# Patient Record
Sex: Male | Born: 1955 | Race: White | Hispanic: No | Marital: Married | Smoking: Never smoker
Health system: Southern US, Community
[De-identification: ages and names within clinical notes are randomized; demographics above are authoritative.]

## PROBLEM LIST (undated history)

## (undated) DIAGNOSIS — F40240 Claustrophobia: Secondary | ICD-10-CM

## (undated) DIAGNOSIS — C801 Malignant (primary) neoplasm, unspecified: Secondary | ICD-10-CM

## (undated) DIAGNOSIS — F419 Anxiety disorder, unspecified: Secondary | ICD-10-CM

## (undated) DIAGNOSIS — M1990 Unspecified osteoarthritis, unspecified site: Secondary | ICD-10-CM

## (undated) DIAGNOSIS — N529 Male erectile dysfunction, unspecified: Secondary | ICD-10-CM

## (undated) DIAGNOSIS — E785 Hyperlipidemia, unspecified: Secondary | ICD-10-CM

## (undated) DIAGNOSIS — K219 Gastro-esophageal reflux disease without esophagitis: Secondary | ICD-10-CM

## (undated) DIAGNOSIS — R42 Dizziness and giddiness: Secondary | ICD-10-CM

## (undated) DIAGNOSIS — K5790 Diverticulosis of intestine, part unspecified, without perforation or abscess without bleeding: Secondary | ICD-10-CM

## (undated) HISTORY — PX: HERNIA REPAIR: SHX51

## (undated) HISTORY — DX: Dizziness and giddiness: R42

## (undated) HISTORY — PX: UPPER GASTROINTESTINAL ENDOSCOPY: SHX188

## (undated) HISTORY — PX: OTHER SURGICAL HISTORY: SHX169

## (undated) HISTORY — DX: Male erectile dysfunction, unspecified: N52.9

## (undated) HISTORY — DX: Hyperlipidemia, unspecified: E78.5

## (undated) HISTORY — DX: Diverticulosis of intestine, part unspecified, without perforation or abscess without bleeding: K57.90

## (undated) HISTORY — DX: Anxiety disorder, unspecified: F41.9

## (undated) HISTORY — DX: Claustrophobia: F40.240

## (undated) HISTORY — DX: Gastro-esophageal reflux disease without esophagitis: K21.9

---

## 2008-09-29 ENCOUNTER — Ambulatory Visit: Payer: Self-pay | Admitting: Surgery

## 2011-03-19 HISTORY — PX: COLONOSCOPY: SHX5424

## 2012-04-10 LAB — HM COLONOSCOPY: HM Colonoscopy: NORMAL

## 2013-06-22 LAB — PSA: PSA: 0.4

## 2014-06-27 LAB — LIPID PANEL
Cholesterol: 190 mg/dL (ref 0–200)
HDL: 54 mg/dL (ref 35–70)
LDL CALC: 114 mg/dL
Triglycerides: 108 mg/dL (ref 40–160)

## 2014-06-27 LAB — BASIC METABOLIC PANEL
CREATININE: 0.8 mg/dL (ref <=1.3)
Glucose: 87 mg/dL

## 2014-07-14 DIAGNOSIS — R7989 Other specified abnormal findings of blood chemistry: Secondary | ICD-10-CM | POA: Insufficient documentation

## 2014-07-14 DIAGNOSIS — Z113 Encounter for screening for infections with a predominantly sexual mode of transmission: Secondary | ICD-10-CM | POA: Insufficient documentation

## 2014-07-14 DIAGNOSIS — K219 Gastro-esophageal reflux disease without esophagitis: Secondary | ICD-10-CM | POA: Insufficient documentation

## 2014-07-14 DIAGNOSIS — Z1331 Encounter for screening for depression: Secondary | ICD-10-CM | POA: Insufficient documentation

## 2014-07-14 DIAGNOSIS — I10 Essential (primary) hypertension: Secondary | ICD-10-CM | POA: Insufficient documentation

## 2014-07-14 DIAGNOSIS — E7849 Other hyperlipidemia: Secondary | ICD-10-CM | POA: Insufficient documentation

## 2014-07-14 DIAGNOSIS — R945 Abnormal results of liver function studies: Secondary | ICD-10-CM | POA: Insufficient documentation

## 2014-11-05 ENCOUNTER — Other Ambulatory Visit: Payer: Self-pay | Admitting: Family Medicine

## 2014-11-05 DIAGNOSIS — K219 Gastro-esophageal reflux disease without esophagitis: Secondary | ICD-10-CM

## 2014-12-04 ENCOUNTER — Other Ambulatory Visit: Payer: Self-pay | Admitting: Family Medicine

## 2014-12-08 ENCOUNTER — Encounter: Payer: Self-pay | Admitting: Family Medicine

## 2014-12-08 ENCOUNTER — Ambulatory Visit (INDEPENDENT_AMBULATORY_CARE_PROVIDER_SITE_OTHER): Payer: BLUE CROSS/BLUE SHIELD | Admitting: Family Medicine

## 2014-12-08 ENCOUNTER — Ambulatory Visit: Payer: Self-pay | Admitting: Family Medicine

## 2014-12-08 VITALS — BP 130/88 | HR 64 | Temp 97.9°F | Ht 68.0 in | Wt 155.0 lb

## 2014-12-08 DIAGNOSIS — E784 Other hyperlipidemia: Secondary | ICD-10-CM | POA: Diagnosis not present

## 2014-12-08 DIAGNOSIS — E7849 Other hyperlipidemia: Secondary | ICD-10-CM

## 2014-12-08 DIAGNOSIS — I10 Essential (primary) hypertension: Secondary | ICD-10-CM

## 2014-12-08 DIAGNOSIS — J0100 Acute maxillary sinusitis, unspecified: Secondary | ICD-10-CM

## 2014-12-08 MED ORDER — AZITHROMYCIN 250 MG PO TABS: ORAL_TABLET | ORAL | Status: DC

## 2014-12-08 NOTE — Progress Notes (Signed)
Name: James Barry   MRN: [id number]    DOB: 1955-10-12   Date:12/08/2014       Progress Note  Subjective  Chief Complaint  Chief Complaint  Patient presents with  . Sinusitis    ear pain with it    Sinusitis This is a chronic problem. The current episode started in the past 7 days. The maximum temperature recorded prior to his arrival was 100.4 - 100.9 F. The fever has been present for 1 to 2 days. His pain is at a severity of 4/10. The pain is moderate. Associated symptoms include chills, congestion, diaphoresis, ear pain, headaches, sinus pressure, sneezing, a sore throat and swollen glands. Pertinent negatives include no coughing, hoarse voice, neck pain or shortness of breath. Past treatments include acetaminophen. The treatment provided no relief.    No problem-specific assessment & plan notes found for this encounter.   Past Medical History  Diagnosis Date  . GERD (gastroesophageal reflux disease)   . Erectile dysfunction     Past Surgical History  Procedure Laterality Date  . Colonoscopy  2013    cleared for 5 yrs- Dr @ [facility])    History reviewed. No pertinent family history.  Social History   Social History  . Marital Status: Single    Spouse Name: N/A  . Number of Children: N/A  . Years of Education: N/A   Occupational History  . Not on file.   Social History Main Topics  . Smoking status: Never Smoker   . Smokeless tobacco: Not on file  . Alcohol Use: 0.0 oz/week    0 Standard drinks or equivalent per week  . Drug Use: No  . Sexual Activity: Not on file   Other Topics Concern  . Not on file   Social History Narrative    Allergies  Allergen Reactions  . Buspirone   . Cephalexin      Review of Systems  Constitutional: Positive for chills and diaphoresis. Negative for fever, weight loss and malaise/fatigue.  HENT: Positive for congestion, ear pain, sinus pressure, sneezing and sore throat. Negative for ear discharge and  hoarse voice.   Eyes: Negative for blurred vision.  Respiratory: Negative for cough, sputum production, shortness of breath and wheezing.   Cardiovascular: Negative for chest pain, palpitations and leg swelling.  Gastrointestinal: Negative for heartburn, nausea, abdominal pain, diarrhea, constipation, blood in stool and melena.  Genitourinary: Negative for dysuria, urgency, frequency and hematuria.  Musculoskeletal: Negative for myalgias, back pain, joint pain and neck pain.  Skin: Negative for rash.  Neurological: Positive for headaches. Negative for dizziness, tingling, sensory change and focal weakness.  Endo/Heme/Allergies: Negative for environmental allergies and polydipsia. Does not bruise/bleed easily.  Psychiatric/Behavioral: Negative for depression and suicidal ideas. The patient is not nervous/anxious and does not have insomnia.      Objective  Filed Vitals:   12/08/14 0824  BP: 130/88  Pulse: 64  Temp: 97.9 F (36.6 C)  TempSrc: Oral  Height: 5\' 8"  (1.727 m)  Weight: 155 lb (70.308 kg)    Physical Exam  Constitutional: He is oriented to person, place, and time and well-developed, well-nourished, and in no distress.  HENT:  Head: Normocephalic.  Right Ear: External ear normal.  Left Ear: External ear normal.  Nose: Nose normal.  Mouth/Throat: Oropharynx is clear and moist.  Eyes: Conjunctivae and EOM are normal. Pupils are equal, round, and reactive to light. Right eye exhibits no discharge. Left eye exhibits no discharge. No scleral icterus.  Neck: Normal range of motion. Neck supple. No JVD present. No tracheal deviation present. No thyromegaly present.  Cardiovascular: Normal rate, regular rhythm, normal heart sounds and intact distal pulses.  Exam reveals no gallop and no friction rub.   No murmur heard. Pulmonary/Chest: Breath sounds normal. No respiratory distress. He has no wheezes. He has no rales.  Abdominal: Soft. Bowel sounds are normal. He exhibits no  mass. There is no hepatosplenomegaly. There is no tenderness. There is no rebound, no guarding and no CVA tenderness.  Musculoskeletal: Normal range of motion. He exhibits no edema or tenderness.  Lymphadenopathy:    He has no cervical adenopathy.  Neurological: He is alert and oriented to person, place, and time. He has normal sensation, normal strength, normal reflexes and intact cranial nerves. No cranial nerve deficit.  Skin: Skin is warm. No rash noted.  Psychiatric: Mood and affect normal.  Nursing note and vitals reviewed.     Assessment & Plan  Problem List Items Addressed This Visit      Cardiovascular and Mediastinum   Essential (primary) hypertension   Relevant Medications   sildenafil (VIAGRA) 100 MG tablet     Other   Familial multiple lipoprotein-type hyperlipidemia   Relevant Medications   sildenafil (VIAGRA) 100 MG tablet    Other Visit Diagnoses    Acute maxillary sinusitis, recurrence not specified    -  Primary    Relevant Medications    azithromycin (ZITHROMAX) 250 MG tablet         Dr. [facility]  12/08/2014

## 2015-02-27 ENCOUNTER — Ambulatory Visit (INDEPENDENT_AMBULATORY_CARE_PROVIDER_SITE_OTHER): Payer: BLUE CROSS/BLUE SHIELD | Admitting: Family Medicine

## 2015-02-27 ENCOUNTER — Encounter: Payer: Self-pay | Admitting: Family Medicine

## 2015-02-27 VITALS — BP 120/80 | HR 68 | Ht 68.0 in | Wt 158.0 lb

## 2015-02-27 DIAGNOSIS — J0100 Acute maxillary sinusitis, unspecified: Secondary | ICD-10-CM | POA: Diagnosis not present

## 2015-02-27 DIAGNOSIS — M2011 Hallux valgus (acquired), right foot: Secondary | ICD-10-CM | POA: Diagnosis not present

## 2015-02-27 MED ORDER — GUAIFENESIN-CODEINE 100-10 MG/5ML PO SOLN: 5.0000 mL | Freq: Three times a day (TID) | ORAL | Status: DC | PRN

## 2015-02-27 MED ORDER — AZITHROMYCIN 250 MG PO TABS: ORAL_TABLET | ORAL | Status: DC

## 2015-02-27 NOTE — Progress Notes (Signed)
Name: James Barry   MRN: [id number]    DOB: [birth year]   Date:02/27/2015       Progress Note  Subjective  Chief Complaint  Chief Complaint  Patient presents with  . Sinusitis    cough and cong, sore throat    Sinusitis This is a new problem. The current episode started in the past 7 days. The problem has been gradually worsening since onset. There has been no fever. Associated symptoms include congestion, coughing, headaches, sinus pressure, sneezing, a sore throat and swollen glands. Pertinent negatives include no chills, diaphoresis, ear pain, hoarse voice, neck pain or shortness of breath. Past treatments include nothing. The treatment provided no relief.    No problem-specific assessment & plan notes found for this encounter.   Past Medical History  Diagnosis Date  . GERD (gastroesophageal reflux disease)   . Erectile dysfunction     Past Surgical History  Procedure Laterality Date  . Colonoscopy  2013    cleared for 5 yrs- Dr @ [facility])    History reviewed. No pertinent family history.  Social History   Social History  . Marital Status: Single    Spouse Name: N/A  . Number of Children: N/A  . Years of Education: N/A   Occupational History  . Not on file.   Social History Main Topics  . Smoking status: Never Smoker   . Smokeless tobacco: Not on file  . Alcohol Use: 0.0 oz/week    0 Standard drinks or equivalent per week  . Drug Use: No  . Sexual Activity: Yes   Other Topics Concern  . Not on file   Social History Narrative    Allergies  Allergen Reactions  . Buspirone   . Cephalexin      Review of Systems  Constitutional: Negative for fever, chills, weight loss, malaise/fatigue and diaphoresis.  HENT: Positive for congestion, sinus pressure, sneezing and sore throat. Negative for ear discharge, ear pain and hoarse voice.   Eyes: Negative for blurred vision.  Respiratory: Positive for cough. Negative for sputum production,  shortness of breath and wheezing.   Cardiovascular: Negative for chest pain, palpitations and leg swelling.  Gastrointestinal: Negative for heartburn, nausea, abdominal pain, diarrhea, constipation, blood in stool and melena.  Genitourinary: Negative for dysuria, urgency, frequency and hematuria.  Musculoskeletal: Negative for myalgias, back pain, joint pain and neck pain.  Skin: Negative for rash.  Neurological: Positive for headaches. Negative for dizziness, tingling, sensory change and focal weakness.  Endo/Heme/Allergies: Negative for environmental allergies and polydipsia. Does not bruise/bleed easily.  Psychiatric/Behavioral: Negative for depression and suicidal ideas. The patient is not nervous/anxious and does not have insomnia.      Objective  Filed Vitals:   02/27/15 1633  BP: 120/80  Pulse: 68  Height: 5\' 8"  (1.727 m)  Weight: 158 lb (71.668 kg)    Physical Exam  Constitutional: He is oriented to person, place, and time and well-developed, well-nourished, and in no distress.  HENT:  Head: Normocephalic.  Right Ear: External ear normal.  Left Ear: External ear normal.  Nose: Nose normal.  Mouth/Throat: Oropharynx is clear and moist.  Eyes: Conjunctivae and EOM are normal. Pupils are equal, round, and reactive to light. Right eye exhibits no discharge. Left eye exhibits no discharge. No scleral icterus.  Neck: Normal range of motion. Neck supple. No JVD present. No tracheal deviation present. No thyromegaly present.  Cardiovascular: Normal rate, regular rhythm, normal heart sounds and intact distal pulses.  Exam  reveals no gallop and no friction rub.   No murmur heard. Pulmonary/Chest: Breath sounds normal. No respiratory distress. He has no wheezes. He has no rales.  Abdominal: Soft. Bowel sounds are normal. He exhibits no mass. There is no hepatosplenomegaly. There is no tenderness. There is no rebound, no guarding and no CVA tenderness.  Musculoskeletal: Normal range  of motion. He exhibits no edema or tenderness.  Lymphadenopathy:    He has no cervical adenopathy.  Neurological: He is alert and oriented to person, place, and time. He has normal sensation, normal strength, normal reflexes and intact cranial nerves. No cranial nerve deficit.  Skin: Skin is warm. No rash noted.  Psychiatric: Mood and affect normal.      Assessment & Plan  Problem List Items Addressed This Visit    None    Visit Diagnoses    Acute maxillary sinusitis, recurrence not specified    -  Primary    Relevant Medications    azithromycin (ZITHROMAX) 250 MG tablet    guaiFENesin-codeine 100-10 MG/5ML syrup    Bunion of great toe, right        Relevant Orders    Ambulatory referral to Podiatry         Dr. Deanna [facility]  02/27/2015

## 2015-03-17 ENCOUNTER — Ambulatory Visit (INDEPENDENT_AMBULATORY_CARE_PROVIDER_SITE_OTHER): Payer: BLUE CROSS/BLUE SHIELD | Admitting: Family Medicine

## 2015-03-17 ENCOUNTER — Encounter: Payer: Self-pay | Admitting: Family Medicine

## 2015-03-17 VITALS — BP 120/82 | HR 80 | Temp 98.0°F | Ht 68.0 in | Wt 158.0 lb

## 2015-03-17 DIAGNOSIS — J0100 Acute maxillary sinusitis, unspecified: Secondary | ICD-10-CM | POA: Diagnosis not present

## 2015-03-17 MED ORDER — DOXYCYCLINE HYCLATE 100 MG PO TABS: 100.0000 mg | ORAL_TABLET | Freq: Two times a day (BID) | ORAL | Status: DC

## 2015-03-17 NOTE — Progress Notes (Signed)
Name: James Barry   MRN: [id number]    DOB: July 13, 1955   Date:03/17/2015       Progress Note  Subjective  Chief Complaint  Chief Complaint  Patient presents with  . Sinusitis    cough and cong.- had a ZPack a couple of weeks ago    Sinusitis This is a new problem. The current episode started yesterday. The problem has been waxing and waning since onset. The maximum temperature recorded prior to his arrival was 100.4 - 100.9 F. Associated symptoms include chills, congestion, coughing, diaphoresis, a hoarse voice, sinus pressure, sneezing, a sore throat and swollen glands. Pertinent negatives include no ear pain, headaches, neck pain or shortness of breath. Past treatments include acetaminophen. The treatment provided mild relief.    No problem-specific assessment & plan notes found for this encounter.   Past Medical History  Diagnosis Date  . GERD (gastroesophageal reflux disease)   . Erectile dysfunction     Past Surgical History  Procedure Laterality Date  . Colonoscopy  2013    cleared for 5 yrs- Dr @ [facility])    History reviewed. No pertinent family history.  Social History   Social History  . Marital Status: Single    Spouse Name: N/A  . Number of Children: N/A  . Years of Education: N/A   Occupational History  . Not on file.   Social History Main Topics  . Smoking status: Never Smoker   . Smokeless tobacco: Not on file  . Alcohol Use: 0.0 oz/week    0 Standard drinks or equivalent per week  . Drug Use: No  . Sexual Activity: Yes   Other Topics Concern  . Not on file   Social History Narrative    Allergies  Allergen Reactions  . Buspirone   . Cephalexin      Review of Systems  Constitutional: Positive for chills and diaphoresis. Negative for fever, weight loss and malaise/fatigue.  HENT: Positive for congestion, hoarse voice, sinus pressure, sneezing and sore throat. Negative for ear discharge and ear pain.   Eyes: Negative  for blurred vision.  Respiratory: Positive for cough. Negative for sputum production, shortness of breath and wheezing.   Cardiovascular: Negative for chest pain, palpitations and leg swelling.  Gastrointestinal: Negative for heartburn, nausea, abdominal pain, diarrhea, constipation, blood in stool and melena.  Genitourinary: Negative for dysuria, urgency, frequency and hematuria.  Musculoskeletal: Negative for myalgias, back pain, joint pain and neck pain.  Skin: Negative for rash.  Neurological: Negative for dizziness, tingling, sensory change, focal weakness and headaches.  Endo/Heme/Allergies: Negative for environmental allergies and polydipsia. Does not bruise/bleed easily.  Psychiatric/Behavioral: Negative for depression and suicidal ideas. The patient is not nervous/anxious and does not have insomnia.      Objective  Filed Vitals:   03/17/15 1003  BP: 120/82  Pulse: 80  Temp: 98 F (36.7 C)  TempSrc: Oral  Height: 5\' 8"  (1.727 m)  Weight: 158 lb (71.668 kg)    Physical Exam  Constitutional: He is oriented to person, place, and time and well-developed, well-nourished, and in no distress.  HENT:  Head: Normocephalic.  Right Ear: External ear normal.  Left Ear: External ear normal.  Nose: Nose normal.  Mouth/Throat: Oropharynx is clear and moist.  Eyes: Conjunctivae and EOM are normal. Pupils are equal, round, and reactive to light. Right eye exhibits no discharge. Left eye exhibits no discharge. No scleral icterus.  Neck: Normal range of motion. Neck supple. No JVD present.  No tracheal deviation present. No thyromegaly present.  Cardiovascular: Normal rate, regular rhythm, normal heart sounds and intact distal pulses.  Exam reveals no gallop and no friction rub.   No murmur heard. Pulmonary/Chest: Breath sounds normal. No respiratory distress. He has no wheezes. He has no rales.  Abdominal: Soft. Bowel sounds are normal. He exhibits no mass. There is no hepatosplenomegaly.  There is no tenderness. There is no rebound, no guarding and no CVA tenderness.  Musculoskeletal: Normal range of motion. He exhibits no edema or tenderness.  Lymphadenopathy:    He has no cervical adenopathy.  Neurological: He is alert and oriented to person, place, and time. He has normal sensation, normal strength, normal reflexes and intact cranial nerves. No cranial nerve deficit.  Skin: Skin is warm. No rash noted.  Psychiatric: Mood and affect normal.  Nursing note and vitals reviewed.     Assessment & Plan  Problem List Items Addressed This Visit    None    Visit Diagnoses    Acute maxillary sinusitis, recurrence not specified    -  Primary    Relevant Medications    doxycycline (VIBRA-TABS) 100 MG tablet         Dr. [facility]  03/17/2015

## 2015-04-10 ENCOUNTER — Other Ambulatory Visit: Payer: Self-pay

## 2015-04-10 DIAGNOSIS — J019 Acute sinusitis, unspecified: Secondary | ICD-10-CM

## 2015-04-10 MED ORDER — DOXYCYCLINE HYCLATE 100 MG PO TABS: 100.0000 mg | ORAL_TABLET | Freq: Two times a day (BID) | ORAL | Status: DC

## 2015-04-28 ENCOUNTER — Encounter: Payer: Self-pay | Admitting: Family Medicine

## 2015-04-28 ENCOUNTER — Ambulatory Visit (INDEPENDENT_AMBULATORY_CARE_PROVIDER_SITE_OTHER): Payer: BLUE CROSS/BLUE SHIELD | Admitting: Family Medicine

## 2015-04-28 VITALS — BP 122/94 | HR 68 | Ht 68.0 in | Wt 158.0 lb

## 2015-04-28 DIAGNOSIS — S29011A Strain of muscle and tendon of front wall of thorax, initial encounter: Secondary | ICD-10-CM

## 2015-04-28 DIAGNOSIS — S46212A Strain of muscle, fascia and tendon of other parts of biceps, left arm, initial encounter: Secondary | ICD-10-CM

## 2015-04-28 DIAGNOSIS — R079 Chest pain, unspecified: Secondary | ICD-10-CM | POA: Diagnosis not present

## 2015-04-28 NOTE — Progress Notes (Signed)
Name: CHOL GRAFFIUS   MRN: [id number]    DOB: 1955/11/27   Date:04/28/2015       Progress Note  Subjective  Chief Complaint  Chief Complaint  Patient presents with  . Chest Pain    Chest Pain  This is a new problem. The current episode started yesterday. The onset quality is sudden. The problem occurs constantly. The problem has been waxing and waning. The pain is present in the lateral region. The pain is at a severity of 7/10 (presently 5/10). The quality of the pain is described as tightness. The pain radiates to the left shoulder and left arm. Pertinent negatives include no abdominal pain, back pain, cough, diaphoresis, dizziness, exertional chest pressure, fever, headaches, irregular heartbeat, malaise/fatigue, nausea, near-syncope, palpitations, shortness of breath, sputum production or weakness. The pain is aggravated by movement. He has tried nothing for the symptoms. The treatment provided mild relief.    No problem-specific assessment & plan notes found for this encounter.   Past Medical History  Diagnosis Date  . GERD (gastroesophageal reflux disease)   . Erectile dysfunction     Past Surgical History  Procedure Laterality Date  . Colonoscopy  2013    cleared for 5 yrs- Dr @ [facility])    History reviewed. No pertinent family history.  Social History   Social History  . Marital Status: Single    Spouse Name: N/A  . Number of Children: N/A  . Years of Education: N/A   Occupational History  . Not on file.   Social History Main Topics  . Smoking status: Never Smoker   . Smokeless tobacco: Not on file  . Alcohol Use: 0.0 oz/week    0 Standard drinks or equivalent per week  . Drug Use: No  . Sexual Activity: Yes   Other Topics Concern  . Not on file   Social History Narrative    Allergies  Allergen Reactions  . Buspirone   . Cephalexin      Review of Systems  Constitutional: Negative for fever, chills, weight loss, malaise/fatigue  and diaphoresis.  HENT: Negative for ear discharge, ear pain and sore throat.   Eyes: Negative for blurred vision.  Respiratory: Negative for cough, sputum production, shortness of breath and wheezing.   Cardiovascular: Positive for chest pain. Negative for palpitations, leg swelling and near-syncope.  Gastrointestinal: Negative for heartburn, nausea, abdominal pain, diarrhea, constipation, blood in stool and melena.  Genitourinary: Negative for dysuria, urgency, frequency and hematuria.  Musculoskeletal: Negative for myalgias, back pain, joint pain and neck pain.  Skin: Negative for rash.  Neurological: Negative for dizziness, tingling, sensory change, focal weakness, weakness and headaches.  Endo/Heme/Allergies: Negative for environmental allergies and polydipsia. Does not bruise/bleed easily.  Psychiatric/Behavioral: Negative for depression and suicidal ideas. The patient is not nervous/anxious and does not have insomnia.      Objective  Filed Vitals:   04/28/15 1017  BP: 122/94  Pulse: 68  Height: 5\' 8"  (1.727 m)  Weight: 158 lb (71.668 kg)    Physical Exam  Constitutional: He is oriented to person, place, and time and well-developed, well-nourished, and in no distress.  HENT:  Head: Normocephalic.  Right Ear: External ear normal.  Left Ear: External ear normal.  Nose: Nose normal.  Mouth/Throat: Oropharynx is clear and moist.  Eyes: Conjunctivae and EOM are normal. Pupils are equal, round, and reactive to light. Right eye exhibits no discharge. Left eye exhibits no discharge. No scleral icterus.  Neck: Normal range  of motion. Neck supple. No JVD present. No tracheal deviation present. No thyromegaly present.  Cardiovascular: Normal rate, regular rhythm, normal heart sounds and intact distal pulses.  Exam reveals no gallop and no friction rub.   No murmur heard. Pulmonary/Chest: Breath sounds normal. No accessory muscle usage. No tachypnea. No respiratory distress. He has no  wheezes. He has no rales. He exhibits tenderness. He exhibits no deformity.    Abdominal: Soft. Bowel sounds are normal. He exhibits no mass. There is no hepatosplenomegaly. There is no tenderness. There is no rebound, no guarding and no CVA tenderness.  Musculoskeletal: Normal range of motion. He exhibits no edema.       Left elbow: Tenderness found.       Arms: Tenderness bicep  Lymphadenopathy:    He has no cervical adenopathy.  Neurological: He is alert and oriented to person, place, and time. He has normal sensation, normal strength and intact cranial nerves. No cranial nerve deficit.  Skin: Skin is warm. No rash noted.  Psychiatric: Mood and affect normal.      Assessment & Plan  Problem List Items Addressed This Visit    None    Visit Diagnoses    Chest pain, unspecified chest pain type    -  Primary    Relevant Orders    EKG 12-Lead (Completed)    Intercostal muscle strain, initial encounter        aleve    Biceps strain, left, initial encounter             Dr. [facility]  04/28/2015

## 2015-06-09 ENCOUNTER — Other Ambulatory Visit: Payer: Self-pay | Admitting: Internal Medicine

## 2015-06-28 ENCOUNTER — Ambulatory Visit (INDEPENDENT_AMBULATORY_CARE_PROVIDER_SITE_OTHER): Payer: BLUE CROSS/BLUE SHIELD | Admitting: Family Medicine

## 2015-06-28 ENCOUNTER — Encounter: Payer: Self-pay | Admitting: Family Medicine

## 2015-06-28 VITALS — BP 130/88 | HR 80 | Ht 68.0 in | Wt 158.0 lb

## 2015-06-28 DIAGNOSIS — N401 Enlarged prostate with lower urinary tract symptoms: Secondary | ICD-10-CM | POA: Diagnosis not present

## 2015-06-28 DIAGNOSIS — E7849 Other hyperlipidemia: Secondary | ICD-10-CM

## 2015-06-28 DIAGNOSIS — E784 Other hyperlipidemia: Secondary | ICD-10-CM

## 2015-06-28 DIAGNOSIS — Z1211 Encounter for screening for malignant neoplasm of colon: Secondary | ICD-10-CM | POA: Diagnosis not present

## 2015-06-28 DIAGNOSIS — R351 Nocturia: Secondary | ICD-10-CM

## 2015-06-28 DIAGNOSIS — R945 Abnormal results of liver function studies: Secondary | ICD-10-CM

## 2015-06-28 DIAGNOSIS — N529 Male erectile dysfunction, unspecified: Secondary | ICD-10-CM | POA: Insufficient documentation

## 2015-06-28 DIAGNOSIS — Z0000 Encounter for general adult medical examination without abnormal findings: Secondary | ICD-10-CM

## 2015-06-28 DIAGNOSIS — K219 Gastro-esophageal reflux disease without esophagitis: Secondary | ICD-10-CM

## 2015-06-28 DIAGNOSIS — R7989 Other specified abnormal findings of blood chemistry: Secondary | ICD-10-CM

## 2015-06-28 LAB — POCT URINALYSIS DIPSTICK
Bilirubin, UA: NEGATIVE
Blood, UA: NEGATIVE
Glucose, UA: NEGATIVE
KETONES UA: NEGATIVE
LEUKOCYTES UA: NEGATIVE
Nitrite, UA: NEGATIVE
PH UA: 6
PROTEIN UA: NEGATIVE
Spec Grav, UA: <=1.005
Urobilinogen, UA: 0.2

## 2015-06-28 LAB — HEMOCCULT GUIAC POC 1CARD (OFFICE): Fecal Occult Blood, POC: POSITIVE — AB

## 2015-06-28 MED ORDER — SILDENAFIL CITRATE 100 MG PO TABS: 100.0000 mg | ORAL_TABLET | Freq: Every day | ORAL | Status: DC | PRN

## 2015-06-28 MED ORDER — ESOMEPRAZOLE MAGNESIUM 40 MG PO CPDR
40.0000 mg | DELAYED_RELEASE_CAPSULE | Freq: Every day | ORAL | Status: DC
Start: 2015-06-28 — End: 2015-12-08

## 2015-06-28 NOTE — Progress Notes (Signed)
Name: James Barry   MRN: [id number]    DOB: August 18, 1955   Date:06/28/2015       Progress Note  Subjective  Chief Complaint  Chief Complaint  Patient presents with  . Annual Exam    HPI Comments: Patient presents for annual physical exam.   No problem-specific assessment & plan notes found for this encounter.   Past Medical History  Diagnosis Date  . GERD (gastroesophageal reflux disease)   . Erectile dysfunction     Past Surgical History  Procedure Laterality Date  . Colonoscopy  2013    cleared for 5 yrs- Dr @ [facility])    History reviewed. No pertinent family history.  Social History   Social History  . Marital Status: Single    Spouse Name: N/A  . Number of Children: N/A  . Years of Education: N/A   Occupational History  . Not on file.   Social History Main Topics  . Smoking status: Never Smoker   . Smokeless tobacco: Not on file  . Alcohol Use: 0.0 oz/week    0 Standard drinks or equivalent per week  . Drug Use: No  . Sexual Activity: Yes   Other Topics Concern  . Not on file   Social History Narrative    Allergies  Allergen Reactions  . Buspirone   . Cephalexin      Review of Systems  Constitutional: Negative for fever, chills, weight loss and malaise/fatigue.  HENT: Negative for ear discharge, ear pain and sore throat.   Eyes: Negative for blurred vision.  Respiratory: Negative for cough, sputum production, shortness of breath and wheezing.   Cardiovascular: Negative for chest pain, palpitations and leg swelling.  Gastrointestinal: Negative for heartburn, nausea, abdominal pain, diarrhea, constipation, blood in stool and melena.  Genitourinary: Negative for dysuria, urgency, frequency and hematuria.  Musculoskeletal: Negative for myalgias, back pain, joint pain and neck pain.  Skin: Negative for rash.  Neurological: Negative for dizziness, tingling, sensory change, focal weakness and headaches.  Endo/Heme/Allergies:  Negative for environmental allergies and polydipsia. Does not bruise/bleed easily.  Psychiatric/Behavioral: Negative for depression and suicidal ideas. The patient is not nervous/anxious and does not have insomnia.      Objective  Filed Vitals:   06/28/15 0833  BP: 130/88  Pulse: 80  Height: 5\' 8"  (1.727 m)  Weight: 158 lb (71.668 kg)    Physical Exam  Constitutional: He is oriented to person, place, and time and well-developed, well-nourished, and in no distress.  HENT:  Head: Normocephalic.  Right Ear: External ear normal.  Left Ear: External ear normal.  Nose: Nose normal.  Mouth/Throat: Oropharynx is clear and moist.  Eyes: Conjunctivae and EOM are normal. Pupils are equal, round, and reactive to light. Right eye exhibits no discharge. Left eye exhibits no discharge. No scleral icterus.  Neck: Normal range of motion. Neck supple. No JVD present. No tracheal deviation present. No thyromegaly present.  Cardiovascular: Normal rate, regular rhythm, normal heart sounds and intact distal pulses.  Exam reveals no gallop and no friction rub.   No murmur heard. Pulmonary/Chest: Breath sounds normal. No respiratory distress. He has no wheezes. He has no rales.  Abdominal: Soft. Bowel sounds are normal. He exhibits no mass. There is no hepatosplenomegaly. There is no tenderness. There is no rebound, no guarding and no CVA tenderness.  Genitourinary: Rectum normal, prostate normal, testes/scrotum normal and penis normal. He exhibits no abnormal testicular mass, no testicular tenderness, no abnormal scrotal mass and no  scrotal tenderness.  Musculoskeletal: Normal range of motion. He exhibits no edema or tenderness.  Lymphadenopathy:    He has no cervical adenopathy.  Neurological: He is alert and oriented to person, place, and time. He has normal sensation, normal strength, normal reflexes and intact cranial nerves. No cranial nerve deficit.  Skin: Skin is warm. No rash noted.  Psychiatric:  Mood and affect normal.  Nursing note and vitals reviewed.     Assessment & Plan  Problem List Items Addressed This Visit      Digestive   Acid reflux   Relevant Medications   esomeprazole (NEXIUM) 40 MG capsule     Genitourinary   Erectile dysfunction     Other   Familial multiple lipoprotein-type hyperlipidemia   Relevant Medications   sildenafil (VIAGRA) 100 MG tablet   Other Relevant Orders   Lipid Profile   Abnormal LFTs   Relevant Orders   Hepatic function panel    Other Visit Diagnoses    Annual physical exam    -  Primary    Relevant Orders    Renal Function Panel    POCT urinalysis dipstick (Completed)    Hemoglobin    Benign prostatic hypertrophy with nocturia        Relevant Orders    PSA    Colon cancer screening        Relevant Orders    POCT Occult Blood Stool (Completed)         Dr. Otilio Miu [facility]  06/28/2015

## 2015-06-29 LAB — RENAL FUNCTION PANEL
ALBUMIN: 4.6 g/dL (ref 3.5–5.5)
BUN/Creatinine Ratio: 16 (ref 9–20)
BUN: 12 mg/dL (ref 6–24)
CALCIUM: 9.8 mg/dL (ref 8.7–10.2)
CHLORIDE: 98 mmol/L (ref 96–106)
CO2: 29 mmol/L (ref 18–29)
Creatinine, Ser: 0.73 mg/dL — ABNORMAL LOW (ref 0.76–1.27)
GFR calc Af Amer: 117 mL/min/{1.73_m2} (ref >59)
GFR, EST NON AFRICAN AMERICAN: 102 mL/min/{1.73_m2} (ref >59)
GLUCOSE: 84 mg/dL (ref 65–99)
PHOSPHORUS: 4.2 mg/dL (ref 2.5–4.5)
Potassium: 4.3 mmol/L (ref 3.5–5.2)
Sodium: 139 mmol/L (ref 134–144)

## 2015-06-29 LAB — LIPID PANEL
CHOLESTEROL TOTAL: 186 mg/dL (ref 100–199)
Chol/HDL Ratio: 3.6 ratio units (ref 0.0–5.0)
HDL: 51 mg/dL (ref >39)
LDL Calculated: 119 mg/dL — ABNORMAL HIGH (ref 0–99)
Triglycerides: 78 mg/dL (ref 0–149)
VLDL CHOLESTEROL CAL: 16 mg/dL (ref 5–40)

## 2015-06-29 LAB — HEPATIC FUNCTION PANEL
ALK PHOS: 61 IU/L (ref 39–117)
ALT: 18 IU/L (ref 0–44)
AST: 20 IU/L (ref 0–40)
BILIRUBIN TOTAL: 0.4 mg/dL (ref 0.0–1.2)
BILIRUBIN, DIRECT: 0.12 mg/dL (ref 0.00–0.40)
TOTAL PROTEIN: 7.2 g/dL (ref 6.0–8.5)

## 2015-06-29 LAB — HEMOGLOBIN: Hemoglobin: 13.3 g/dL (ref 12.6–17.7)

## 2015-06-29 LAB — PSA: Prostate Specific Ag, Serum: 0.5 ng/mL (ref 0.0–4.0)

## 2015-08-02 ENCOUNTER — Encounter: Payer: Self-pay | Admitting: Family Medicine

## 2015-08-02 ENCOUNTER — Ambulatory Visit (INDEPENDENT_AMBULATORY_CARE_PROVIDER_SITE_OTHER): Payer: BLUE CROSS/BLUE SHIELD | Admitting: Family Medicine

## 2015-08-02 VITALS — BP 130/100 | HR 80 | Ht 68.0 in | Wt 158.0 lb

## 2015-08-02 DIAGNOSIS — L739 Follicular disorder, unspecified: Secondary | ICD-10-CM

## 2015-08-02 MED ORDER — SULFAMETHOXAZOLE-TRIMETHOPRIM 800-160 MG PO TABS: 1.0000 | ORAL_TABLET | Freq: Two times a day (BID) | ORAL | Status: DC

## 2015-08-02 MED ORDER — MUPIROCIN 2 % EX OINT: 1.0000 "application " | TOPICAL_OINTMENT | Freq: Two times a day (BID) | CUTANEOUS | Status: DC

## 2015-08-02 NOTE — Progress Notes (Signed)
Name: James Barry   MRN: [id number]    DOB: 08-20-1955   Date:08/02/2015       Progress Note  Subjective  Chief Complaint  Chief Complaint  Patient presents with  . Cyst    "ingrown hair- pants are rubbing it and making it sore"    Rash This is a new problem. The current episode started in the past 7 days. The problem has been waxing and waning since onset. The affected locations include the groin. The rash is characterized by draining, redness and swelling. He was exposed to nothing. Pertinent negatives include no anorexia, congestion, cough, diarrhea, eye pain, facial edema, fatigue, fever, joint pain, nail changes, rhinorrhea, shortness of breath, sore throat or vomiting. Treatments tried: warm compresses. The treatment provided no relief.    No problem-specific assessment & plan notes found for this encounter.   Past Medical History  Diagnosis Date  . GERD (gastroesophageal reflux disease)   . Erectile dysfunction     Past Surgical History  Procedure Laterality Date  . Colonoscopy  2013    cleared for 5 yrs- Dr @ [facility])    History reviewed. No pertinent family history.  Social History   Social History  . Marital Status: Single    Spouse Name: N/A  . Number of Children: N/A  . Years of Education: N/A   Occupational History  . Not on file.   Social History Main Topics  . Smoking status: Never Smoker   . Smokeless tobacco: Not on file  . Alcohol Use: 0.0 oz/week    0 Standard drinks or equivalent per week  . Drug Use: No  . Sexual Activity: Yes   Other Topics Concern  . Not on file   Social History Narrative    Allergies  Allergen Reactions  . Buspirone   . Cephalexin      Review of Systems  Constitutional: Negative for fever, chills, weight loss, malaise/fatigue and fatigue.  HENT: Negative for congestion, ear discharge, ear pain, rhinorrhea and sore throat.   Eyes: Negative for blurred vision and pain.  Respiratory: Negative  for cough, sputum production, shortness of breath and wheezing.   Cardiovascular: Negative for chest pain, palpitations and leg swelling.  Gastrointestinal: Negative for heartburn, nausea, vomiting, abdominal pain, diarrhea, constipation, blood in stool, melena and anorexia.  Genitourinary: Negative for dysuria, urgency, frequency and hematuria.  Musculoskeletal: Negative for myalgias, back pain, joint pain and neck pain.  Skin: Positive for rash. Negative for nail changes.  Neurological: Negative for dizziness, tingling, sensory change, focal weakness and headaches.  Endo/Heme/Allergies: Negative for environmental allergies and polydipsia. Does not bruise/bleed easily.  Psychiatric/Behavioral: Negative for depression and suicidal ideas. The patient is not nervous/anxious and does not have insomnia.      Objective  Filed Vitals:   08/02/15 0802  BP: 130/100  Pulse: 80  Height: 5\' 8"  (1.727 m)  Weight: 158 lb (71.668 kg)    Physical Exam  Constitutional: He is oriented to person, place, and time and well-developed, well-nourished, and in no distress.  HENT:  Head: Normocephalic.  Right Ear: External ear normal.  Left Ear: External ear normal.  Nose: Nose normal.  Mouth/Throat: Oropharynx is clear and moist.  Eyes: Conjunctivae and EOM are normal. Pupils are equal, round, and reactive to light. Right eye exhibits no discharge. Left eye exhibits no discharge. No scleral icterus.  Neck: Normal range of motion. Neck supple. No JVD present. No tracheal deviation present. No thyromegaly present.  Cardiovascular:  Normal rate, regular rhythm, normal heart sounds and intact distal pulses.  Exam reveals no gallop and no friction rub.   No murmur heard. Pulmonary/Chest: Breath sounds normal. No respiratory distress. He has no wheezes. He has no rales.  Abdominal: Soft. Bowel sounds are normal. He exhibits no mass. There is no hepatosplenomegaly. There is no tenderness. There is no rebound, no  guarding and no CVA tenderness.  Musculoskeletal: Normal range of motion. He exhibits no edema or tenderness.  Lymphadenopathy:    He has no cervical adenopathy.  Neurological: He is alert and oriented to person, place, and time. He has normal sensation, normal strength, normal reflexes and intact cranial nerves. No cranial nerve deficit.  Skin: Skin is warm. No rash noted. There is erythema.  Psychiatric: Mood and affect normal.  Nursing note and vitals reviewed.     Assessment & Plan  Problem List Items Addressed This Visit    None    Visit Diagnoses    Acute folliculitis    -  Primary    area cleaned with betadine/ unroofed with #15/ purulence noted/ dressing call if worsens    Relevant Medications    sulfamethoxazole-trimethoprim (BACTRIM DS,SEPTRA DS) 800-160 MG tablet    mupirocin ointment (BACTROBAN) 2 %         Dr. Aydden Cumpian [facility]  08/02/2015

## 2015-12-07 ENCOUNTER — Ambulatory Visit (INDEPENDENT_AMBULATORY_CARE_PROVIDER_SITE_OTHER): Payer: BLUE CROSS/BLUE SHIELD | Admitting: Family Medicine

## 2015-12-07 ENCOUNTER — Encounter: Payer: Self-pay | Admitting: Family Medicine

## 2015-12-07 VITALS — BP 148/98 | HR 80 | Temp 97.6°F | Ht 67.0 in | Wt 147.0 lb

## 2015-12-07 DIAGNOSIS — J40 Bronchitis, not specified as acute or chronic: Secondary | ICD-10-CM | POA: Diagnosis not present

## 2015-12-07 DIAGNOSIS — J0100 Acute maxillary sinusitis, unspecified: Secondary | ICD-10-CM | POA: Diagnosis not present

## 2015-12-07 MED ORDER — GUAIFENESIN-CODEINE 100-10 MG/5ML PO SYRP: 5.0000 mL | ORAL_SOLUTION | Freq: Three times a day (TID) | ORAL | 0 refills | Status: DC | PRN

## 2015-12-07 MED ORDER — AZITHROMYCIN 250 MG PO TABS: ORAL_TABLET | ORAL | 0 refills | Status: DC

## 2015-12-07 NOTE — Progress Notes (Signed)
Name: James Barry   MRN: [id number]    DOB: [birth year]   Date:12/07/2015       Progress Note  Subjective  Chief Complaint  Chief Complaint  Patient presents with  . Cough    since yesterday/ throat sore at night-"i have this hacking cough"  . Nasal Congestion    Cough  This is a new problem. The current episode started yesterday. The problem has been gradually worsening. The problem occurs every few minutes. The cough is productive of purulent sputum (yellow). Associated symptoms include chills, a fever, headaches, nasal congestion, postnasal drip and rhinorrhea. Pertinent negatives include no chest pain, ear congestion, ear pain, heartburn, hemoptysis, myalgias, rash, sore throat, shortness of breath, sweats, weight loss or wheezing. Treatments tried: toddy. The treatment provided mild relief. There is no history of asthma, bronchiectasis, bronchitis, COPD, emphysema, environmental allergies or pneumonia.    No problem-specific Assessment & Plan notes found for this encounter.   Past Medical History:  Diagnosis Date  . Erectile dysfunction   . GERD (gastroesophageal reflux disease)     Past Surgical History:  Procedure Laterality Date  . COLONOSCOPY  2013   cleared for 5 yrs- Dr @ [facility])    History reviewed. No pertinent family history.  Social History   Social History  . Marital status: Single    Spouse name: N/A  . Number of children: N/A  . Years of education: N/A   Occupational History  . Not on file.   Social History Main Topics  . Smoking status: Never Smoker  . Smokeless tobacco: Not on file  . Alcohol use 0.0 oz/week  . Drug use: No  . Sexual activity: Yes   Other Topics Concern  . Not on file   Social History Narrative  . No narrative on file    Allergies  Allergen Reactions  . Buspirone   . Cephalexin      Review of Systems  Constitutional: Positive for chills and fever. Negative for malaise/fatigue and weight loss.   HENT: Positive for postnasal drip and rhinorrhea. Negative for ear discharge, ear pain and sore throat.   Eyes: Negative for blurred vision.  Respiratory: Positive for cough. Negative for hemoptysis, sputum production, shortness of breath and wheezing.   Cardiovascular: Negative for chest pain, palpitations and leg swelling.  Gastrointestinal: Negative for abdominal pain, blood in stool, constipation, diarrhea, heartburn, melena and nausea.  Genitourinary: Negative for dysuria, frequency, hematuria and urgency.  Musculoskeletal: Negative for back pain, joint pain, myalgias and neck pain.  Skin: Negative for rash.  Neurological: Positive for headaches. Negative for dizziness, tingling, sensory change and focal weakness.  Endo/Heme/Allergies: Negative for environmental allergies and polydipsia. Does not bruise/bleed easily.  Psychiatric/Behavioral: Negative for depression and suicidal ideas. The patient is not nervous/anxious and does not have insomnia.      Objective  Vitals:   12/07/15 1337  BP: (!) 148/98  Pulse: 80  Temp: 97.6 F (36.4 C)  Weight: 147 lb (66.7 kg)  Height: 5\' 7"  (1.702 m)    Physical Exam  Constitutional: He is oriented to person, place, and time and well-developed, well-nourished, and in no distress.  HENT:  Head: Normocephalic.  Right Ear: External ear normal.  Left Ear: External ear normal.  Nose: Nose normal.  Mouth/Throat: Oropharynx is clear and moist.  Eyes: Conjunctivae and EOM are normal. Pupils are equal, round, and reactive to light. Right eye exhibits no discharge. Left eye exhibits no discharge. No scleral icterus.  Neck: Normal range of motion. Neck supple. No JVD present. No tracheal deviation present. No thyromegaly present.  Cardiovascular: Normal rate, regular rhythm, normal heart sounds and intact distal pulses.  Exam reveals no gallop and no friction rub.   No murmur heard. Pulmonary/Chest: Breath sounds normal. No respiratory distress.  He has no wheezes. He has no rales.  Abdominal: Soft. Bowel sounds are normal. He exhibits no mass. There is no hepatosplenomegaly. There is no tenderness. There is no rebound, no guarding and no CVA tenderness.  Musculoskeletal: Normal range of motion. He exhibits no edema or tenderness.  Lymphadenopathy:    He has no cervical adenopathy.  Neurological: He is alert and oriented to person, place, and time. He has normal sensation, normal strength, normal reflexes and intact cranial nerves. No cranial nerve deficit.  Skin: Skin is warm. No rash noted.  Psychiatric: Mood and affect normal.  Nursing note and vitals reviewed.     Assessment & Plan  Problem List Items Addressed This Visit    None    Visit Diagnoses    Acute maxillary sinusitis, recurrence not specified    -  Primary   Relevant Medications   azithromycin (ZITHROMAX) 250 MG tablet   guaiFENesin-codeine (ROBITUSSIN AC) 100-10 MG/5ML syrup   Bronchitis       Relevant Medications   azithromycin (ZITHROMAX) 250 MG tablet   guaiFENesin-codeine (ROBITUSSIN AC) 100-10 MG/5ML syrup        Dr. [facility]  12/07/15

## 2015-12-08 ENCOUNTER — Other Ambulatory Visit: Payer: Self-pay | Admitting: Internal Medicine

## 2016-01-07 ENCOUNTER — Other Ambulatory Visit: Payer: Self-pay | Admitting: Family Medicine

## 2016-02-28 ENCOUNTER — Other Ambulatory Visit: Payer: Self-pay | Admitting: Family Medicine

## 2016-03-08 ENCOUNTER — Other Ambulatory Visit: Payer: Self-pay

## 2016-03-20 MED FILL — SILDENAFIL 100 MG TABLET: 100 | 30 days supply | Qty: 6 | Fill #0

## 2016-03-20 MED FILL — ESOMEPRAZOLE MAG DR 40 MG C: 40 | 30 days supply | Qty: 30 | Fill #0

## 2016-05-07 ENCOUNTER — Other Ambulatory Visit: Payer: Self-pay | Admitting: Family Medicine

## 2016-05-07 DIAGNOSIS — H524 Presbyopia: Secondary | ICD-10-CM | POA: Diagnosis not present

## 2016-05-07 DIAGNOSIS — H5202 Hypermetropia, left eye: Secondary | ICD-10-CM | POA: Diagnosis not present

## 2016-05-07 DIAGNOSIS — H52223 Regular astigmatism, bilateral: Secondary | ICD-10-CM | POA: Diagnosis not present

## 2016-05-07 MED FILL — TOBRAMYCIN 0.3% EYE DROPS: 0.3 | 19 days supply | Qty: 5 | Fill #0

## 2016-05-07 MED FILL — SILDENAFIL 100 MG TABLET: 100 | 30 days supply | Qty: 6 | Fill #1

## 2016-05-07 MED FILL — ESOMEPRAZOLE MAG DR 40 MG C: 40 | 30 days supply | Qty: 30 | Fill #0

## 2016-05-08 ENCOUNTER — Ambulatory Visit
Admission: EM | Admit: 2016-05-08 | Discharge: 2016-05-08 | Disposition: A | Discharge status: Home / Self Care | Payer: 59 | Attending: Family Medicine | Admitting: Family Medicine

## 2016-05-08 ENCOUNTER — Encounter: Payer: Self-pay | Admitting: *Deleted

## 2016-05-08 DIAGNOSIS — R197 Diarrhea, unspecified: Secondary | ICD-10-CM

## 2016-05-08 DIAGNOSIS — R112 Nausea with vomiting, unspecified: Secondary | ICD-10-CM

## 2016-05-08 LAB — RAPID INFLUENZA A&B ANTIGENS: Influenza B (ARMC): NEGATIVE

## 2016-05-08 LAB — RAPID INFLUENZA A&B ANTIGENS (ARMC ONLY): INFLUENZA A (ARMC): NEGATIVE

## 2016-05-08 MED ORDER — PROMETHAZINE HCL 25 MG/ML IJ SOLN
25.0000 mg | Freq: Once | INTRAMUSCULAR | Status: AC
  Administered 2016-05-08: 25 mg via INTRAMUSCULAR

## 2016-05-08 MED ORDER — PROMETHAZINE HCL 25 MG PO TABS: 25.0000 mg | ORAL_TABLET | Freq: Three times a day (TID) | ORAL | 0 refills | Status: DC | PRN

## 2016-05-08 MED ORDER — ACETAMINOPHEN 500 MG PO TABS
1000.0000 mg | ORAL_TABLET | Freq: Once | ORAL | Status: AC
  Administered 2016-05-08: 1000 mg via ORAL

## 2016-05-08 MED ORDER — ONDANSETRON 4 MG PO TBDP
4.0000 mg | ORAL_TABLET | Freq: Once | ORAL | Status: AC
  Administered 2016-05-08: 4 mg via ORAL

## 2016-05-08 NOTE — ED Triage Notes (Signed)
Patient started having symptoms of nausea, vomiting, diarrhea, and fever last PM.

## 2016-05-08 NOTE — ED Provider Notes (Signed)
[facility] ____________________________________________  Time seen: Approximately 8:45 PM  I have reviewed the triage vital signs and the nursing notes.   HISTORY  Chief Complaint Emesis; Nausea; Fever; and Diarrhea   HPI James Barry is a 61 y.o. male presenting for the complaints of nausea, vomiting diarrhea. Patient reports onset last night at approximately 7 PM. Patient reports multiple episodes of vomiting and diarrhea last night into this morning. Reports last vomiting episode was approximately 6 or 7 AM this morning. Reports last diarrhea episode was in this afternoon. Patient denies abnormal colored stool or vomit. Denies hematochezia, melena or frank blood. Reports he has had some cramping abdominal discomfort intermittently, but reports much improved currently. Patient reports this afternoon he also had accompanying body aches and chills, denies known fevers. Reports has not taken any medications today for the same complaints. Denies cough, congestion, runny nose or sore throat. Denies known sick contacts. Reports partner ate same foods, and denies known food trigger. Reports continues to urinate well, denies dysuria.   denies chest pain, shortness of breath, dysuria, extremity pain, extremity swelling or rash. Denies recent sickness. Denies recent antibiotic use.   Otilio Miu, MD: PCP   Past Medical History:  Diagnosis Date  . Erectile dysfunction   . GERD (gastroesophageal reflux disease)     Patient Active Problem List   Diagnosis Date Noted  . Erectile dysfunction 06/28/2015  . 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 Routine general medical examination at a health care facility 07/14/2014  . Abnormal LFTs 07/14/2014  . Essential (primary) hypertension 07/14/2014  . Acid reflux 07/14/2014  . Screening for depression 07/14/2014    Past Surgical History:  Procedure Laterality Date  . COLONOSCOPY  2013   cleared for 5 yrs-  Dr @ [facility])     No current facility-administered medications for this encounter.   Current Outpatient Prescriptions:  .  esomeprazole (NEXIUM) 40 MG capsule, TAKE 1 CAPSULE BY MOUTH EVERY DAY, Disp: 30 capsule, Rfl: 0 .  FIBER PO, Take by mouth., Disp: , Rfl:  .  Multiple Vitamin (MULTIVITAMIN) capsule, Take 1 capsule by mouth daily., Disp: , Rfl:  .  sildenafil (VIAGRA) 100 MG tablet, Take 1 tablet (100 mg total) by mouth daily as needed for erectile dysfunction. Gets 4 per 30 days, Disp: 10 tablet, Rfl: 11 .  azithromycin (ZITHROMAX) 250 MG tablet, 2 today then 1 a day for 4 days, Disp: 6 tablet, Rfl: 0 .  guaiFENesin-codeine (ROBITUSSIN AC) 100-10 MG/5ML syrup, Take 5 mLs by mouth 3 (three) times daily as needed for cough., Disp: 150 mL, Rfl: 0 .  mupirocin ointment (BACTROBAN) 2 %, Place 1 application into the nose 2 (two) times daily. (Patient not taking: Reported on 12/07/2015), Disp: 22 g, Rfl: 0 .  promethazine (PHENERGAN) 25 MG tablet, Take 1 tablet (25 mg total) by mouth every 8 (eight) hours as needed for nausea or vomiting., Disp: 15 tablet, Rfl: 0 .  sulfamethoxazole-trimethoprim (BACTRIM DS,SEPTRA DS) 800-160 MG tablet, Take 1 tablet by mouth 2 (two) times daily. (Patient not taking: Reported on 12/07/2015), Disp: 20 tablet, Rfl: 0  Allergies Buspirone and Cephalexin  History reviewed. No pertinent family history.  Social History Social History  Substance Use Topics  . Smoking status: Never Smoker  . Smokeless tobacco: Never Used  . Alcohol use 0.0 oz/week    Review of Systems Constitutional: As above  Eyes: No visual changes. ENT: No sore throat. Cardiovascular: Denies chest pain. Respiratory: Denies  shortness of breath. Gastrointestinal: As above..No constipation. Genitourinary: Negative for dysuria. Musculoskeletal: Negative for back pain. Skin: Negative for rash. Neurological: Negative for headaches, focal weakness or numbness.  10-point  ROS otherwise negative.  ____________________________________________   PHYSICAL EXAM:  VITAL SIGNS: ED Triage Vitals  Enc Vitals Group     BP 05/08/16 1927 125/87     Pulse Rate 05/08/16 1927 (!) 132     Resp 05/08/16 1927 16     Temp 05/08/16 1927 100.3 F (37.9 C)     Temp Source 05/08/16 1927 Oral     SpO2 05/08/16 1927 95 %     Weight 05/08/16 1929 152 lb (68.9 kg)     Height 05/08/16 1929 5\' 7"  (1.702 m)     Head Circumference --      Peak Flow --      Pain Score 05/08/16 1933 0     Pain Loc --      Pain Edu? --      Excl. in [place]? --    Vitals:   05/08/16 1927 05/08/16 1929 05/08/16 2131  BP: 125/87  125/86  Pulse: (!) 132  (!) 113  Resp: 16    Temp: 100.3 F (37.9 C)  97.6 F (36.4 C)  TempSrc: Oral  Oral  SpO2: 95%  96%  Weight:  152 lb (68.9 kg)   Height:  5\' 7"  (1.702 m)     Constitutional: Alert and oriented. Well appearing and in no acute distress. Eyes: Conjunctivae are normal. PERRL. EOMI. ENT      Head: Normocephalic and atraumatic.      Nose: No congestion/rhinnorhea.      Mouth/Throat: Mucous membranes are moist.Oropharynx non-erythematous. no tonsillar swelling or exudate.  Neck: No stridor. Supple without meningismus.  Hematological/Lymphatic/Immunilogical: No cervical lymphadenopathy. Cardiovascular: Normal rate, regular rhythm. Grossly normal heart sounds.  Good peripheral circulation. Respiratory: Normal respiratory effort without tachypnea nor retractions. Breath sounds are clear and equal bilaterally. No wheezes, rales, rhonchi. Gastrointestinal:minimal periumbilicus tenderness to palpation, abdomen soft and otherwise nontender.istention. Normal Bowel sounds. No CVA tenderness. Musculoskeletal: No midline cervical, thoracic or lumbar tenderness to palpation.  Neurologic:  Normal speech and language.  Speech is normal. No gait instability.  Skin:  Skin is warm, dry and intact. No rash noted. Psychiatric: Mood and affect are normal. Speech  and behavior are normal. Patient exhibits appropriate insight and judgment   ___________________________________________   LABS (all labs ordered are listed, but only abnormal results are displayed)  Labs Reviewed  RAPID INFLUENZA A&B ANTIGENS ([place])   ____________________________________________  PROCEDURES Procedures    INITIAL IMPRESSION / ASSESSMENT AND PLAN / ED COURSE  Pertinent labs & imaging results that were available during my care of the patient were reviewed by me and considered in my medical decision making (see chart for details).  Patient presenting for the complaints of nausea, vomiting and diarrhea since last night.  Patient also with low-grade fever in urgent care. Influenza negative. Suspect viral such as Norvasc. Patient reports he has had good success in the past with Phenergan. 4 mg odt Zofran given once and 25 mg IM Phenergan given. After medications in urgent care, patient reports he is feeling better. Patient also given Tylenol while in urgent care. Patient drinking fluids and reports feeling better. Patient requests to be discharged. I discussed evaluating stool sample, but patient was unable to have stool sample while in urgent care. Discussed supportive care. When necessary Phenergan. Rest, fluids and BRAT diet. Discussed strict  follow-up and return parameters.Discussed indication, risks and benefits of medications with patient.  Discussed follow up with Primary care physician this week. Discussed follow up and return parameters including no resolution or any worsening concerns. Patient verbalized understanding and agreed to plan.   ____________________________________________   FINAL CLINICAL IMPRESSION(S) / ED DIAGNOSES  Final diagnoses:  Nausea vomiting and diarrhea     Discharge Medication List as of 05/08/2016  9:37 PM    START taking these medications   Details  promethazine (PHENERGAN) 25 MG tablet Take 1 tablet (25 mg total) by mouth  every 8 (eight) hours as needed for nausea or vomiting., Starting Wed 05/08/2016, Normal        Note: This dictation was prepared with Dragon dictation along with smaller phrase technology. Any transcriptional errors that result from this process are unintentional.         Marylene Land, NP 05/08/16 2155    Marylene Land, NP 05/08/16 2155

## 2016-05-08 NOTE — Discharge Instructions (Signed)
Take medication as prescribed. Rest. Drink plenty of fluids. Follow BRAT diet.  Follow up with your primary care physician this week as needed. Return to Urgent care for new or worsening concerns.

## 2016-05-09 ENCOUNTER — Ambulatory Visit: Payer: Self-pay | Admitting: Family Medicine

## 2016-05-09 MED FILL — PROMETHAZINE 25 MG TABLET: 25 | 5 days supply | Qty: 15 | Fill #0

## 2016-06-04 ENCOUNTER — Other Ambulatory Visit: Payer: Self-pay | Admitting: Family Medicine

## 2016-06-04 MED FILL — SILDENAFIL 100 MG TABLET: 100 | 30 days supply | Qty: 6 | Fill #2

## 2016-06-05 MED FILL — ESOMEPRAZOLE MAG DR 40 MG C: 40 | 30 days supply | Qty: 30 | Fill #0

## 2016-06-28 ENCOUNTER — Other Ambulatory Visit: Payer: Self-pay | Admitting: Family Medicine

## 2016-06-28 MED FILL — SILDENAFIL 100 MG TABLET: 100 | 30 days supply | Qty: 4 | Fill #0

## 2016-07-02 ENCOUNTER — Encounter: Payer: Self-pay | Admitting: Family Medicine

## 2016-07-09 ENCOUNTER — Encounter: Payer: Self-pay | Admitting: Family Medicine

## 2016-07-09 ENCOUNTER — Telehealth: Payer: Self-pay

## 2016-07-09 ENCOUNTER — Ambulatory Visit (INDEPENDENT_AMBULATORY_CARE_PROVIDER_SITE_OTHER): Payer: 59 | Admitting: Family Medicine

## 2016-07-09 VITALS — BP 130/80 | HR 80 | Ht 67.0 in | Wt 160.0 lb

## 2016-07-09 DIAGNOSIS — N529 Male erectile dysfunction, unspecified: Secondary | ICD-10-CM | POA: Diagnosis not present

## 2016-07-09 DIAGNOSIS — K219 Gastro-esophageal reflux disease without esophagitis: Secondary | ICD-10-CM | POA: Diagnosis not present

## 2016-07-09 MED ORDER — SILDENAFIL CITRATE 100 MG PO TABS: ORAL_TABLET | ORAL | 3 refills | Status: DC

## 2016-07-09 MED ORDER — ESOMEPRAZOLE MAGNESIUM 40 MG PO CPDR: DELAYED_RELEASE_CAPSULE | ORAL | 3 refills | Status: DC

## 2016-07-09 MED FILL — ESOMEPRAZOLE MAG DR 40 MG C: 40 | 90 days supply | Qty: 90 | Fill #0

## 2016-07-09 NOTE — Telephone Encounter (Signed)
Pt's husband called in after the office visit and was questioning what the patient was being seen for. The pt was originally scheduled for an annual physical, but when he got here he wanted his meds refilled. Dr Ronnald Ramp decided to discuss and refill meds since pt was "out of medicine" and reschedule the physical for July. We changed the description of visit to a med refill and the patient was charged a copay. Husband called into office questioning why he didn't have his physical. He was "argumentative" with front staff, therefore Roselyn Reef decided to speak with patient to look further into the complaint. She explained the process of physical vs med refill and the pt's husband said we are "price gouging and I work for [employer] so I will be reporting you". After calling the pt to explain what happened and why- he was understanding and said "thank you for explaining it to me and I'll speak to Richardson Landry." After Dr Ronnald Ramp learning about this whole episode, she has decided to end services with this patient as she feels like we should not have to be threatened to be reported by someone, especially not the patient. I have notified the patient that we will no longer be able to continue services after a 30 day period from today. We "have enjoyed taking care of you over the years, but feel it may be in your best interest to find another physician". Pt said "thank you for calling me and telling me". Husband was also upset when Roselyn Reef would not give out info about pt's visit.

## 2016-07-09 NOTE — Progress Notes (Signed)
Name: James Barry   MRN: [id number]    DOB: 02-01-1956   Date:07/09/2016       Progress Note  Subjective  Chief Complaint  Chief Complaint  Patient presents with  . Annual Exam  . Gastroesophageal Reflux  . Erectile Dysfunction    Gastroesophageal Reflux  He complains of heartburn. He reports no abdominal pain, no belching, no chest pain, no choking, no coughing, no dysphagia, no early satiety, no globus sensation, no hoarse voice, no nausea, no sore throat, no stridor, no tooth decay, no water brash or no wheezing. This is a recurrent problem. The problem has been gradually improving. The heartburn is of mild intensity. The heartburn does not wake him from sleep. The symptoms are aggravated by certain foods. Pertinent negatives include no weight loss. He has tried a PPI for the symptoms. The treatment provided mild relief.  Erectile Dysfunction  This is a chronic problem. The problem has been waxing and waning since onset. He reports no anxiety, decreased libido or performance anxiety. Irritative symptoms do not include frequency or urgency. Pertinent negatives include no chills, dysuria or hematuria. Past treatments include sildenafil. The treatment provided moderate relief.    No problem-specific Assessment & Plan notes found for this encounter.   Past Medical History:  Diagnosis Date  . Erectile dysfunction   . GERD (gastroesophageal reflux disease)     Past Surgical History:  Procedure Laterality Date  . COLONOSCOPY  2013   cleared for 5 yrs- Dr @ [facility])    No family history on file.  Social History   Social History  . Marital status: Single    Spouse name: N/A  . Number of children: N/A  . Years of education: N/A   Occupational History  . Not on file.   Social History Main Topics  . Smoking status: Never Smoker  . Smokeless tobacco: Never Used  . Alcohol use 0.0 oz/week  . Drug use: No  . Sexual activity: Yes   Other Topics Concern  .  Not on file   Social History Narrative  . No narrative on file    Allergies  Allergen Reactions  . Buspirone   . Cephalexin     Outpatient Medications Prior to Visit  Medication Sig Dispense Refill  . FIBER PO Take by mouth.    . Multiple Vitamin (MULTIVITAMIN) capsule Take 1 capsule by mouth daily.    Marland Kitchen esomeprazole (NEXIUM) 40 MG capsule TAKE 1 CAPSULE BY MOUTH EVERY DAY 30 capsule 0  . sildenafil (VIAGRA) 100 MG tablet TAKE 1 TABLET BY MOUTH DAILY AS NEEDED FOR ERECTILE DYSFUNCTION. 4 tablet 0  . azithromycin (ZITHROMAX) 250 MG tablet 2 today then 1 a day for 4 days 6 tablet 0  . guaiFENesin-codeine (ROBITUSSIN AC) 100-10 MG/5ML syrup Take 5 mLs by mouth 3 (three) times daily as needed for cough. 150 mL 0  . mupirocin ointment (BACTROBAN) 2 % Place 1 application into the nose 2 (two) times daily. (Patient not taking: Reported on 12/07/2015) 22 g 0  . promethazine (PHENERGAN) 25 MG tablet Take 1 tablet (25 mg total) by mouth every 8 (eight) hours as needed for nausea or vomiting. 15 tablet 0  . sulfamethoxazole-trimethoprim (BACTRIM DS,SEPTRA DS) 800-160 MG tablet Take 1 tablet by mouth 2 (two) times daily. (Patient not taking: Reported on 12/07/2015) 20 tablet 0   No facility-administered medications prior to visit.     Review of Systems  Constitutional: Negative for chills, fever, malaise/fatigue and  weight loss.  HENT: Negative for ear pain, hoarse voice and sore throat.   Eyes: Negative for blurred vision.  Respiratory: Negative for cough, sputum production, choking, shortness of breath and wheezing.   Cardiovascular: Negative for chest pain, palpitations and leg swelling.  Gastrointestinal: Positive for heartburn. Negative for abdominal pain, blood in stool, dysphagia and nausea.  Genitourinary: Negative for decreased libido, dysuria, frequency, hematuria and urgency.  Musculoskeletal: Negative for back pain, joint pain, myalgias and neck pain.  Skin: Negative for rash.   Neurological: Negative for sensory change, focal weakness and headaches.  Psychiatric/Behavioral: Negative for depression and suicidal ideas. The patient is not nervous/anxious and does not have insomnia.      Objective  Vitals:   07/09/16 0859  BP: 130/80  Pulse: 80  Weight: 160 lb (72.6 kg)  Height: 5\' 7"  (1.702 m)    Physical Exam  Constitutional: He is oriented to person, place, and time and well-developed, well-nourished, and in no distress.  HENT:  Head: Normocephalic.  Right Ear: External ear normal.  Left Ear: External ear normal.  Nose: Nose normal.  Mouth/Throat: Oropharynx is clear and moist.  Eyes: Conjunctivae and EOM are normal. Pupils are equal, round, and reactive to light. Right eye exhibits no discharge. Left eye exhibits no discharge. No scleral icterus.  Neck: Normal range of motion. Neck supple. No JVD present. No tracheal deviation present. No thyromegaly present.  Cardiovascular: Normal rate, regular rhythm, normal heart sounds and intact distal pulses.  Exam reveals no gallop and no friction rub.   No murmur heard. Pulmonary/Chest: Breath sounds normal. No respiratory distress. He has no wheezes. He has no rales.  Abdominal: Soft. Bowel sounds are normal. He exhibits no mass. There is no hepatosplenomegaly. There is no tenderness. There is no rebound, no guarding and no CVA tenderness.  Genitourinary: Rectum normal and prostate normal. Rectal exam shows guaiac negative stool.  Musculoskeletal: Normal range of motion. He exhibits no edema.  Lymphadenopathy:    He has no cervical adenopathy.  Neurological: He is alert and oriented to person, place, and time. He has normal sensation, normal strength and intact cranial nerves. No cranial nerve deficit.  Skin: Skin is warm. No rash noted.  Psychiatric: Mood and affect normal.  Nursing note and vitals reviewed.     Assessment & Plan  Problem List Items Addressed This Visit      Digestive   Acid reflux  - Primary   Relevant Medications   esomeprazole (NEXIUM) 40 MG capsule     Genitourinary   Erectile dysfunction      Meds ordered this encounter  Medications  . sildenafil (VIAGRA) 100 MG tablet    Sig: TAKE 1 TABLET BY MOUTH DAILY AS NEEDED FOR ERECTILE DYSFUNCTION.    Dispense:  18 tablet    Refill:  3    Pt gets 4 per 30 days- if needing 52- will send to urology  . esomeprazole (NEXIUM) 40 MG capsule    Sig: TAKE 1 CAPSULE BY MOUTH EVERY DAY    Dispense:  90 capsule    Refill:  3    Needs appt      Dr. Otilio Miu [facility]  07/09/16

## 2016-07-09 NOTE — Telephone Encounter (Signed)
Spoke to subscriber/ Husband about why Dr. Ronnald Ramp changed the CPE to OV. Many issues discussed and I can not ot will not discuss the personal issues with anyone accept patient due to nature of visit. Advised to call billing after he repeated that we are money gouging and that he is a doctor at [facility].

## 2016-07-16 ENCOUNTER — Ambulatory Visit (INDEPENDENT_AMBULATORY_CARE_PROVIDER_SITE_OTHER): Payer: 59 | Admitting: Primary Care

## 2016-07-16 ENCOUNTER — Encounter: Payer: Self-pay | Admitting: Primary Care

## 2016-07-16 ENCOUNTER — Other Ambulatory Visit: Payer: Self-pay | Admitting: Primary Care

## 2016-07-16 DIAGNOSIS — E784 Other hyperlipidemia: Secondary | ICD-10-CM

## 2016-07-16 DIAGNOSIS — E785 Hyperlipidemia, unspecified: Secondary | ICD-10-CM

## 2016-07-16 DIAGNOSIS — N529 Male erectile dysfunction, unspecified: Secondary | ICD-10-CM

## 2016-07-16 DIAGNOSIS — K219 Gastro-esophageal reflux disease without esophagitis: Secondary | ICD-10-CM

## 2016-07-16 DIAGNOSIS — E7849 Other hyperlipidemia: Secondary | ICD-10-CM

## 2016-07-16 DIAGNOSIS — I10 Essential (primary) hypertension: Secondary | ICD-10-CM

## 2016-07-16 NOTE — Assessment & Plan Note (Signed)
Lipid panel 1 year ago stable without meds. Will repeat lipids at upcoming physical. Commended him on healthy lifestyle changes through diet and exercise.

## 2016-07-16 NOTE — Progress Notes (Signed)
Subjective:    Patient ID: James Barry, male    DOB: [birth year], 61 y.o.   MRN: [id number]  HPI  James Barry is a 61 year old male who presents today to establish care and discuss the problems mentioned below. Will obtain old records.  1) Erectile Dysfunction: Currently managed on Viagra 100 mg for which he takes once weekly to every two weeks on average. He feels well managed on this regimen.  2) Hyperlipidemia: Previously managed on medication for which he was able to come off of due to weight loss through healthy diet and exercise. His last lipid panel was stable in 2017. He's continued to work on diet and exercise.  3) GERD: Diagnosed 4 years ago. Currently managed on Nexium 40 mg and most recently with the addition of ranitidine 150 mg BID. He experiences symptoms of esophageal burning, throat fullness, cough. He added in the ranitidine 6 weeks ago due to increased symptoms that woke him from sleep. Endoscopy completed in 2014.  4) Bunion: Located to right great toe. 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 He went on a trip to [place] recently, since he's been back his second toe has been bothering him. He's been taking 800 mg of ibuprofen every morning and 800 mg ibuprofen for the past 2-3 weeks. Symptoms of toe pain are intermittent. He's noticed minor swelling. Denies erythema, history of gout.  Review of Systems  Eyes: Negative for visual disturbance.  Respiratory: Negative for shortness of breath.   Cardiovascular: Negative for chest pain.  Gastrointestinal: Negative for abdominal pain.       GERD  Musculoskeletal: Positive for arthralgias.  Neurological: Negative for numbness.       Past Medical History:  Diagnosis Date  . Diverticulosis   . Erectile dysfunction   . GERD (gastroesophageal reflux disease)   . Hyperlipidemia   . Vertigo      Social History   Social History  . Marital status: Single    Spouse name:  N/A  . Number of children: N/A  . Years of education: N/A   Occupational History  . Not on file.   Social History Main Topics  . Smoking status: Never Smoker  . Smokeless tobacco: Never Used  . Alcohol use 0.0 oz/week  . Drug use: No  . Sexual activity: Yes   Other Topics Concern  . Not on file   Social History Narrative   Married.   No children.   Retired.   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    Past Surgical History:  Procedure Laterality Date  . COLONOSCOPY  2013   cleared for 5 yrs- Dr @ [facility])    Family History  Problem Relation Age of Onset  . Breast cancer Mother   . Lung cancer Father   . Heart disease Father   . Hypertension Father   . Arthritis Maternal Grandmother     Allergies  Allergen Reactions  . Buspirone   . Cephalexin     Current Outpatient Prescriptions on File Prior to Visit  Medication Sig Dispense Refill  . esomeprazole (NEXIUM) 40 MG capsule TAKE 1 CAPSULE BY MOUTH EVERY DAY 90 capsule 3  . FIBER PO Take by mouth.    . Multiple Vitamin (MULTIVITAMIN) capsule Take 1 capsule by mouth daily.    . sildenafil (VIAGRA) 100 MG tablet TAKE 1 TABLET BY MOUTH DAILY AS  NEEDED FOR ERECTILE DYSFUNCTION. 18 tablet 3   No current facility-administered medications on file prior to visit.     BP 136/84   Pulse 70   Temp 97.9 F (36.6 C) (Oral)   Ht 5\' 7"  (1.702 m)   Wt 155 lb 12.8 oz (70.7 kg)   SpO2 96%   BMI 24.40 kg/m    Objective:   Physical Exam  Constitutional: He appears well-nourished.  HENT:  Right Ear: Tympanic membrane and ear canal normal.  Left Ear: Tympanic membrane and ear canal normal.  Nose: No mucosal edema. Right sinus exhibits no maxillary sinus tenderness and no frontal sinus tenderness. Left sinus exhibits no maxillary sinus tenderness and no frontal sinus tenderness.  Mouth/Throat: Oropharynx is clear and moist.  Eyes: Conjunctivae are normal.  Neck:  Neck supple.  Cardiovascular: Normal rate and regular rhythm.   Pulmonary/Chest: Effort normal and breath sounds normal. He has no wheezes. He has no rales.  Skin: Skin is warm and dry.          Assessment & Plan:

## 2016-07-16 NOTE — Assessment & Plan Note (Signed)
Currently managed on Nexium 40 mg once daily and Zantac twice a day. Will have him omit morning dose of Zantac and monitor symptoms. If symptoms stable then we'll have him omit the evening dose of Zantac, continue Nexium 40 mg.. He will update if symptoms return after omission of Zantac. At that point consider pantoprazole 20 mg in place of Nexium.

## 2016-07-16 NOTE — Progress Notes (Signed)
Pre visit review using our clinic review tool, if applicable. No additional management support is needed unless otherwise documented below in the visit note. 

## 2016-07-16 NOTE — Assessment & Plan Note (Signed)
Stable on current dose of Viagra for which he uses as needed.

## 2016-07-16 NOTE — Assessment & Plan Note (Signed)
Stable in the office today. Not currently on medication due to weight loss secondary to healthy lifestyle changes. Continue to monitor.

## 2016-07-16 NOTE — Patient Instructions (Signed)
Please notify me if no improvement in your toe pain within the next 2 weeks.  Try to omit the morning dose of the ranitidine. If no symptoms, then omit the evening dose of the ranitidine. If your symptoms return please message me through My Chart.  Please schedule a physical with me at your convenience. You may also schedule a lab only appointment 3-4 days prior. We will discuss your lab results in detail during your physical.  It was a pleasure to meet you today! Please don't hesitate to call me with any questions. Welcome to [facility]!

## 2016-07-19 ENCOUNTER — Other Ambulatory Visit (INDEPENDENT_AMBULATORY_CARE_PROVIDER_SITE_OTHER): Payer: 59

## 2016-07-19 DIAGNOSIS — E785 Hyperlipidemia, unspecified: Secondary | ICD-10-CM | POA: Diagnosis not present

## 2016-07-19 LAB — COMPREHENSIVE METABOLIC PANEL
ALBUMIN: 5 g/dL (ref 3.5–5.2)
ALT: 23 U/L (ref 0–53)
AST: 26 U/L (ref 0–37)
Alkaline Phosphatase: 50 U/L (ref 39–117)
BUN: 9 mg/dL (ref 6–23)
CALCIUM: 10.1 mg/dL (ref 8.4–10.5)
CO2: 27 mEq/L (ref 19–32)
Chloride: 101 mEq/L (ref 96–112)
Creatinine, Ser: 0.87 mg/dL (ref 0.40–1.50)
GFR: 94.88 mL/min (ref >60.00)
Glucose, Bld: 89 mg/dL (ref 70–99)
POTASSIUM: 3.8 meq/L (ref 3.5–5.1)
Sodium: 137 mEq/L (ref 135–145)
TOTAL PROTEIN: 7.9 g/dL (ref 6.0–8.3)
Total Bilirubin: 0.7 mg/dL (ref 0.2–1.2)

## 2016-07-19 LAB — LIPID PANEL
CHOL/HDL RATIO: 4
Cholesterol: 192 mg/dL (ref 0–200)
HDL: 54.5 mg/dL (ref >39.00)
LDL CALC: 122 mg/dL — AB (ref 0–99)
NONHDL: 137.22
TRIGLYCERIDES: 75 mg/dL (ref 0.0–149.0)
VLDL: 15 mg/dL (ref 0.0–40.0)

## 2016-07-22 MED FILL — SILDENAFIL 100 MG TABLET: 100 | 90 days supply | Qty: 18 | Fill #0

## 2016-07-23 ENCOUNTER — Ambulatory Visit (INDEPENDENT_AMBULATORY_CARE_PROVIDER_SITE_OTHER): Payer: 59 | Admitting: Primary Care

## 2016-07-23 ENCOUNTER — Encounter: Payer: Self-pay | Admitting: Primary Care

## 2016-07-23 VITALS — BP 118/78 | HR 84 | Temp 97.9°F | Ht 67.0 in | Wt 152.5 lb

## 2016-07-23 DIAGNOSIS — I10 Essential (primary) hypertension: Secondary | ICD-10-CM | POA: Diagnosis not present

## 2016-07-23 DIAGNOSIS — R945 Abnormal results of liver function studies: Secondary | ICD-10-CM | POA: Diagnosis not present

## 2016-07-23 DIAGNOSIS — Z0000 Encounter for general adult medical examination without abnormal findings: Secondary | ICD-10-CM

## 2016-07-23 DIAGNOSIS — E784 Other hyperlipidemia: Secondary | ICD-10-CM | POA: Diagnosis not present

## 2016-07-23 DIAGNOSIS — Z23 Encounter for immunization: Secondary | ICD-10-CM | POA: Diagnosis not present

## 2016-07-23 DIAGNOSIS — R7989 Other specified abnormal findings of blood chemistry: Secondary | ICD-10-CM

## 2016-07-23 DIAGNOSIS — E7849 Other hyperlipidemia: Secondary | ICD-10-CM

## 2016-07-23 MED ORDER — ZOSTER VAC RECOMB ADJUVANTED 50 MCG/0.5ML IM SUSR: INTRAMUSCULAR | 1 refills | Status: DC

## 2016-07-23 NOTE — Progress Notes (Signed)
Subjective:    Patient ID: James Barry, male    DOB: [birth year], 61 y.o.   MRN: [id number]  HPI  James Barry is a 61 year old male who presents today for complete physical.  Immunizations: -Tetanus: Completed in 2011 -Shingles: Never completed, due.  Diet: He endorses a healthy diet. Breakfast: Bagel with cream cheese, fruit, honey oats and nuts Lunch: Yogurt, soup, crackers,  Dinner: Meat, vegetable Snacks: None Desserts: Fudge cycle Beverages: Coffee, water, orange juice, diet pepsi  Exercise: He is a member of the [employer] and exercises 6-7 days weekly, competes in 5K.  Eye exam: Completed in  February 2018. [name] Dental exam: Completes semi-annually Colonoscopy: Completed in 2014, due in 2019 PSA: Completed in 2017.    Review of Systems  Constitutional: Negative for unexpected weight change.  HENT: Negative for rhinorrhea.   Respiratory: Negative for cough and shortness of breath.   Cardiovascular: Negative for chest pain.  Gastrointestinal: Negative for constipation and diarrhea.  Genitourinary: Negative for difficulty urinating.       Nocturia  Musculoskeletal: Negative for arthralgias and myalgias.  Skin: Negative for rash.  Allergic/Immunologic: Negative for environmental allergies.  Neurological: Negative for dizziness, numbness and headaches.  Psychiatric/Behavioral:       He denies concerns for anxiety or depression       Past Medical History:  Diagnosis Date  . Diverticulosis   . Erectile dysfunction   . GERD (gastroesophageal reflux disease)   . Hyperlipidemia   . Vertigo      Social History   Social History  . Marital status: Single    Spouse name: N/A  . Number of children: N/A  . Years of education: N/A   Occupational History  . Not on file.   Social History Main Topics  . Smoking status: Never Smoker  . Smokeless tobacco: Never Used  . Alcohol use 0.0 oz/week     Comment: social  . Drug use: No  . Sexual activity: Yes    Other Topics Concern  . Not on file   Social History Narrative   Married.   No children.   Retired.   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    Past Surgical History:  Procedure Laterality Date  . COLONOSCOPY  2013   cleared for 5 yrs- Dr @ [facility])    Family History  Problem Relation Age of Onset  . Breast cancer Mother   . Lung cancer Father   . Heart disease Father   . Hypertension Father   . Arthritis Maternal Grandmother     Allergies  Allergen Reactions  . Buspirone   . Cephalexin     Current Outpatient Prescriptions on File Prior to Visit  Medication Sig Dispense Refill  . esomeprazole (NEXIUM) 40 MG capsule TAKE 1 CAPSULE BY MOUTH EVERY DAY 90 capsule 3  . FIBER PO Take by mouth.    . Multiple Vitamin (MULTIVITAMIN) capsule Take 1 capsule by mouth daily.    . sildenafil (VIAGRA) 100 MG tablet TAKE 1 TABLET BY MOUTH DAILY AS NEEDED FOR ERECTILE DYSFUNCTION. 18 tablet 3   No current facility-administered medications on file prior to visit.     BP 118/78   Pulse 84   Temp 97.9 F (36.6 C) (Oral)   Ht 5\' 7"  (1.702 m)   Wt 152 lb 8 oz (69.2 kg)   SpO2 98%   BMI 23.88 kg/m    Objective:  Physical Exam  Constitutional: He is oriented to person, place, and time. He appears well-nourished.  HENT:  Right Ear: Tympanic membrane and ear canal normal.  Left Ear: Tympanic membrane and ear canal normal.  Nose: Nose normal. Right sinus exhibits no maxillary sinus tenderness and no frontal sinus tenderness. Left sinus exhibits no maxillary sinus tenderness and no frontal sinus tenderness.  Mouth/Throat: Oropharynx is clear and moist.  Eyes: Conjunctivae and EOM are normal. Pupils are equal, round, and reactive to light.  Neck: Neck supple. Carotid bruit is not present. No thyromegaly present.  Cardiovascular: Normal rate, regular rhythm and normal heart sounds.   Pulmonary/Chest: Effort normal and breath  sounds normal. He has no wheezes. He has no rales.  Abdominal: Soft. Bowel sounds are normal. There is no tenderness.  Musculoskeletal: Normal range of motion.  Neurological: He is alert and oriented to person, place, and time. He has normal reflexes. No cranial nerve deficit.  Skin: Skin is warm and dry.  Psychiatric: He has a normal mood and affect.          Assessment & Plan:

## 2016-07-23 NOTE — Progress Notes (Signed)
Pre visit review using our clinic review tool, if applicable. No additional management support is needed unless otherwise documented below in the visit note. 

## 2016-07-23 NOTE — Patient Instructions (Addendum)
Congratulations on your continued weight loss, keep up the excellent work!  Continue exercising. You should be getting 150 minutes of moderate intensity exercise weekly.  Take the Shingles vaccination to the pharmacy for administration. Repeat with a second dose in 2-6 months.  Follow up in 1 year for your annual exam or sooner if needed.  It was a pleasure to see you today!

## 2016-07-23 NOTE — Assessment & Plan Note (Addendum)
Tdap UTD, shingles vaccination due, Rx printed for Shingrix. PSA UTD. Colonoscopy UTD, due in 2019. Commended him on regular exercise and healthy diet. Encouraged him to continue. Exam unremarkable. Labs unremarkable. Follow up in 1 year for annual exam.

## 2016-07-23 NOTE — Assessment & Plan Note (Signed)
Lipids stable on recent draw. Encouraged him to continue healthy diet and regular exercise.

## 2016-07-23 NOTE — Assessment & Plan Note (Signed)
LFT's unremarkable today.

## 2016-07-23 NOTE — Assessment & Plan Note (Signed)
Stable today.

## 2016-09-04 ENCOUNTER — Encounter: Payer: Self-pay | Admitting: Primary Care

## 2016-09-04 ENCOUNTER — Ambulatory Visit (INDEPENDENT_AMBULATORY_CARE_PROVIDER_SITE_OTHER): Payer: 59 | Admitting: Primary Care

## 2016-09-04 VITALS — BP 134/68 | HR 78 | Temp 98.3°F | Wt 148.0 lb

## 2016-09-04 DIAGNOSIS — T148XXA Other injury of unspecified body region, initial encounter: Secondary | ICD-10-CM

## 2016-09-04 NOTE — Progress Notes (Signed)
Subjective:    Patient ID: James Barry, male    DOB: [birth year], 61 y.o.   MRN: [id number]  HPI  [name] is a 61 year old male who presents today with a chief complaint of bruising.   His bruising is present to the 3rd and 2nd toe nails of his right foot and great toe nail of his left foot. He walked 13 miles on Saturday this past weekend as he was participating on a "pilgrimage" tour with his church. He prepared by wearing compression socks, comfortable shoes. He denies any trauma or injury during the walk. Sunday he went to the beach for the day and walked around on the sand. Monday morning he woke up and noticed bruising to the toe nailes of his 2nd and 3rd right toe nail. This morning he noticed bruising to his left great toe nail. He has been walking on the treadmill at the gym this week.  He denies trauma/injury, history of bleeding disorders, numbness/tingling.   Review of Systems  Constitutional: Negative for fever.  Skin: Negative for wound.       Bruising under the toe nails  Neurological: Negative for weakness and numbness.       Past Medical History:  Diagnosis Date  . Diverticulosis   . Erectile dysfunction   . GERD (gastroesophageal reflux disease)   . Hyperlipidemia   . Vertigo      Social History   Social History  . Marital status: Single    Spouse name: N/A  . Number of children: N/A  . Years of education: N/A   Occupational History  . Not on file.   Social History Main Topics  . Smoking status: Never Smoker  . Smokeless tobacco: Never Used  . Alcohol use 0.0 oz/week     Comment: social  . Drug use: No  . Sexual activity: Yes   Other Topics Concern  . Not on file   Social History Narrative   Married.   No children.   Retired.   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    Past Surgical History:  Procedure Laterality Date  . COLONOSCOPY  2013   cleared for 5 yrs- Dr @ [facility])     Family History  Problem Relation Age of Onset  . Breast cancer Mother   . Lung cancer Father   . Heart disease Father   . Hypertension Father   . Arthritis Maternal Grandmother     Allergies  Allergen Reactions  . Buspirone   . Cephalexin     Current Outpatient Prescriptions on File Prior to Visit  Medication Sig Dispense Refill  . acetaminophen (TYLENOL) 650 MG CR tablet Take 650 mg by mouth every 8 (eight) hours as needed for pain.    Marland Kitchen esomeprazole (NEXIUM) 40 MG capsule TAKE 1 CAPSULE BY MOUTH EVERY DAY 90 capsule 3  . FIBER PO Take by mouth.    . Multiple Vitamin (MULTIVITAMIN) capsule Take 1 capsule by mouth daily.    . sildenafil (VIAGRA) 100 MG tablet TAKE 1 TABLET BY MOUTH DAILY AS NEEDED FOR ERECTILE DYSFUNCTION. 18 tablet 3  . Zoster Vac Recomb Adjuvanted [facility]) injection Inject 0.5 ml into the muscle once. Repeat with second dose within 2-6 months. 0.5 mL 1   No current facility-administered medications on file prior to visit.     BP 134/68 (BP Location: Left Arm, Patient Position: Sitting, Cuff Size: Normal)   Pulse 78  Temp 98.3 F (36.8 C) (Oral)   Wt 148 lb (67.1 kg)   SpO2 97%   BMI 23.18 kg/m    Objective:   Physical Exam  Constitutional: He appears well-nourished.  Neck: Neck supple.  Cardiovascular: Normal rate.   Pulmonary/Chest: Effort normal.  Skin: Skin is warm and dry.  Bruising under toe nails of 2nd and 3rd toes of right foot, great toe of left foot. 2nd toenail of right foot in stage of resolving bruise.          Assessment & Plan:  Bruising:  Located under toenails as described. Suspect this is due to the combination of his 13 mile walk while wearing compression socks, and then walking on the beach on Sunday. This does not appear suspicious at this point, most likely "trauma" induced. Will have him monitor the bruising, hold off on gym exercise for the next 3 days. If no improvement then consider dermatology evaluation.    Sheral Flow, NP

## 2016-09-04 NOTE — Patient Instructions (Signed)
Monitor the bruising and notify me if no improvement in 1 week.  It was a pleasure to see you today!

## 2016-09-09 ENCOUNTER — Other Ambulatory Visit: Payer: Self-pay | Admitting: Primary Care

## 2016-09-09 DIAGNOSIS — Z202 Contact with and (suspected) exposure to infections with a predominantly sexual mode of transmission: Secondary | ICD-10-CM

## 2016-09-09 MED ORDER — AZITHROMYCIN 250 MG PO TABS: ORAL_TABLET | ORAL | 0 refills | Status: DC

## 2016-09-09 MED FILL — AZITHROMYCIN 250 MG TAB: 250 | 1 days supply | Qty: 4 | Fill #0

## 2016-09-10 ENCOUNTER — Encounter: Payer: Self-pay | Admitting: Primary Care

## 2016-09-13 MED FILL — SHINGRIX 50 MCG SUS: 50 | 1 days supply | Qty: 1 | Fill #0

## 2016-09-16 ENCOUNTER — Encounter: Payer: Self-pay | Admitting: Family Medicine

## 2016-10-01 ENCOUNTER — Other Ambulatory Visit: Payer: Self-pay | Admitting: Primary Care

## 2016-10-01 DIAGNOSIS — Z202 Contact with and (suspected) exposure to infections with a predominantly sexual mode of transmission: Secondary | ICD-10-CM

## 2016-10-01 MED ORDER — DOXYCYCLINE HYCLATE 100 MG PO TABS: 100.0000 mg | ORAL_TABLET | Freq: Two times a day (BID) | ORAL | 0 refills | Status: DC

## 2016-10-01 MED FILL — DOXYCYCLINE HYC 100 MG TAB: 100 | 10 days supply | Qty: 20 | Fill #0

## 2016-10-02 ENCOUNTER — Other Ambulatory Visit: Payer: Self-pay | Admitting: Primary Care

## 2016-10-02 ENCOUNTER — Other Ambulatory Visit (INDEPENDENT_AMBULATORY_CARE_PROVIDER_SITE_OTHER): Payer: 59 | Admitting: Primary Care

## 2016-10-02 DIAGNOSIS — Z113 Encounter for screening for infections with a predominantly sexual mode of transmission: Secondary | ICD-10-CM | POA: Diagnosis not present

## 2016-10-02 DIAGNOSIS — Z202 Contact with and (suspected) exposure to infections with a predominantly sexual mode of transmission: Secondary | ICD-10-CM

## 2016-10-02 LAB — CBC
HCT: 39.6 % (ref 39.0–52.0)
Hemoglobin: 13.4 g/dL (ref 13.0–17.0)
MCHC: 33.8 g/dL (ref 30.0–36.0)
MCV: 86.6 fl (ref 78.0–100.0)
PLATELETS: 284 10*3/uL (ref 150.0–400.0)
RBC: 4.58 Mil/uL (ref 4.22–5.81)
RDW: 15 % (ref 11.5–15.5)
WBC: 9 10*3/uL (ref 4.0–10.5)

## 2016-10-02 MED FILL — ESOMEPRAZOLE MAG DR 40 MG C: 40 | 90 days supply | Qty: 90 | Fill #1

## 2016-10-03 ENCOUNTER — Encounter: Payer: Self-pay | Admitting: Primary Care

## 2016-10-03 LAB — GC/CHLAMYDIA PROBE AMP
CT PROBE, AMP APTIMA: NOT DETECTED
CT PROBE, AMP APTIMA: NOT DETECTED
GC PROBE AMP APTIMA: NOT DETECTED
GC PROBE AMP APTIMA: NOT DETECTED

## 2016-10-03 LAB — HEPATITIS C ANTIBODY: HCV AB: NEGATIVE

## 2016-10-03 LAB — HSV(HERPES SIMPLEX VRS) I + II AB-IGG
HSV 1 Glycoprotein G Ab, IgG: 24.2 Index — ABNORMAL HIGH (ref <0.90)
HSV 2 GLYCOPROTEIN G AB, IGG: 0.95 {index} — AB (ref <0.90)

## 2016-10-03 LAB — RPR

## 2016-10-03 LAB — HIV ANTIBODY (ROUTINE TESTING W REFLEX): HIV 1&2 Ab, 4th Generation: NONREACTIVE

## 2016-10-07 LAB — HSV 1/2 AB (IGM), IFA W/RFLX TITER
HSV 1 IgM Screen: NEGATIVE
HSV 2 IgM Screen: NEGATIVE

## 2016-10-15 ENCOUNTER — Telehealth: Payer: Self-pay | Admitting: Primary Care

## 2016-10-15 NOTE — Telephone Encounter (Signed)
Spoken to patient. Notified patient that the urine that was collected was for GC/Chlamydia. Patient verbalized understanding.

## 2016-10-15 NOTE — Telephone Encounter (Signed)
Pt called to check on urinalysis collected 10/02/16 visit. He has checked MyChart and viewed other labs but doesn't see the results from urinalysis. He is requesting a cb to discuss.

## 2016-10-29 ENCOUNTER — Telehealth: Payer: Self-pay

## 2016-10-29 NOTE — Telephone Encounter (Signed)
Of course partner was treated.

## 2016-10-29 NOTE — Telephone Encounter (Signed)
James Barry with [facility] called for recent lab results and even thou pt was negative since pts partner was positive for RPR and chlamydia was pt offered any treatment. James Barry request cb.Please advise.

## 2016-10-29 NOTE — Telephone Encounter (Signed)
Spoken to [place] [facility] and notified of her Kate's comments below.

## 2016-11-06 DIAGNOSIS — R768 Other specified abnormal immunological findings in serum: Secondary | ICD-10-CM | POA: Diagnosis not present

## 2016-11-06 DIAGNOSIS — Z206 Contact with and (suspected) exposure to human immunodeficiency virus [HIV]: Secondary | ICD-10-CM | POA: Diagnosis not present

## 2016-11-06 DIAGNOSIS — A630 Anogenital (venereal) warts: Secondary | ICD-10-CM | POA: Diagnosis not present

## 2016-11-06 DIAGNOSIS — Z202 Contact with and (suspected) exposure to infections with a predominantly sexual mode of transmission: Secondary | ICD-10-CM | POA: Diagnosis not present

## 2016-11-06 MED FILL — TRUVADA 200-300 MG TABS: 200-300 | 30 days supply | Qty: 30 | Fill #0

## 2016-11-25 MED FILL — SHINGRIX 50 MCG SUS: 50 | 1 days supply | Qty: 1 | Fill #1

## 2016-12-02 MED FILL — TRUVADA 200-300 MG TABS: 200-300 | 30 days supply | Qty: 30 | Fill #1

## 2016-12-02 MED FILL — SILDENAFIL CITRATE 100 MG T: 100 | 90 days supply | Qty: 18 | Fill #1

## 2016-12-13 ENCOUNTER — Ambulatory Visit: Payer: 59

## 2017-01-01 MED FILL — ESOMEPRAZOLE MAG DR 40 MG C: 40 | 90 days supply | Qty: 90 | Fill #2

## 2017-01-01 MED FILL — TRUVADA 200-300 MG TABS: 200-300 | 30 days supply | Qty: 30 | Fill #2

## 2017-01-07 ENCOUNTER — Ambulatory Visit (INDEPENDENT_AMBULATORY_CARE_PROVIDER_SITE_OTHER): Payer: 59

## 2017-01-07 ENCOUNTER — Ambulatory Visit: Payer: 59

## 2017-01-07 DIAGNOSIS — Z23 Encounter for immunization: Secondary | ICD-10-CM

## 2017-01-09 ENCOUNTER — Encounter: Payer: Self-pay | Admitting: Primary Care

## 2017-01-09 NOTE — Telephone Encounter (Signed)
Will you check [facility] for his vaccination history? Thanks.

## 2017-01-24 DIAGNOSIS — Z206 Contact with and (suspected) exposure to human immunodeficiency virus [HIV]: Secondary | ICD-10-CM | POA: Diagnosis not present

## 2017-01-24 MED FILL — TRUVADA 200-300 MG TABS: 200-300 | 30 days supply | Qty: 30 | Fill #0

## 2017-02-17 MED FILL — TRUVADA 200-300 MG TABS: 200-300 | 30 days supply | Qty: 30 | Fill #3

## 2017-02-25 MED FILL — SILDENAFIL CITRATE 100 MG T: 100 | 90 days supply | Qty: 18 | Fill #2

## 2017-03-20 ENCOUNTER — Ambulatory Visit (INDEPENDENT_AMBULATORY_CARE_PROVIDER_SITE_OTHER): Payer: 59 | Admitting: Primary Care

## 2017-03-20 ENCOUNTER — Encounter: Payer: Self-pay | Admitting: Primary Care

## 2017-03-20 VITALS — BP 118/74 | HR 82 | Temp 98.0°F | Ht 67.0 in | Wt 152.8 lb

## 2017-03-20 DIAGNOSIS — J019 Acute sinusitis, unspecified: Secondary | ICD-10-CM | POA: Diagnosis not present

## 2017-03-20 MED ORDER — AZITHROMYCIN 250 MG PO TABS: ORAL_TABLET | ORAL | 0 refills | Status: DC

## 2017-03-20 MED FILL — TRUVADA 200-300 MG TABS: 200-300 | 30 days supply | Qty: 30 | Fill #1

## 2017-03-20 MED FILL — AZITHROMYCIN 250 MG TAB: 250 | 5 days supply | Qty: 6 | Fill #0

## 2017-03-20 NOTE — Patient Instructions (Signed)
Start Azithromycin antibiotics for infection. Take 2 tablets by mouth today, then 1 tablet daily for 4 additional days.  Ensure you are staying hydrated with water to help thin the mucous.  Okay to take Nyquil at bedtime for sleep and rest.  It was a pleasure to see you today!

## 2017-03-20 NOTE — Progress Notes (Signed)
Subjective:    Patient ID: James Barry, male    DOB: [birth year], 62 y.o.   MRN: [id number]  HPI  James Barry is a 62 year old male who presents today with a chief complaint of sinus pressure. He also reports cough, sore throat, nasal congestion, post nasal drip, fevers. His symptoms began about one week ago after visiting family members. Since then he's noticed increased pressure to his oral cavity and to both frontal and maxillary sinuses. He's been taking Tylenol with temporary improvement. He has a history of sinus infections that he'll typically get in early winter.   Review of Systems  Constitutional: Positive for fever.  HENT: Positive for congestion, ear pain and sinus pressure. Negative for sore throat.   Respiratory: Positive for cough. Negative for shortness of breath and wheezing.        Past Medical History:  Diagnosis Date  . Diverticulosis   . Erectile dysfunction   . GERD (gastroesophageal reflux disease)   . Hyperlipidemia   . Vertigo      Social History   Socioeconomic History  . Marital status: Single    Spouse name: Not on file  . Number of children: Not on file  . Years of education: Not on file  . Highest education level: Not on file  Social Needs  . Financial resource strain: Not on file  . Food insecurity - worry: Not on file  . Food insecurity - inability: Not on file  . Transportation needs - medical: Not on file  . Transportation needs - non-medical: Not on file  Occupational History  . Not on file  Tobacco Use  . Smoking status: Never Smoker  . Smokeless tobacco: Never Used  Substance and Sexual Activity  . Alcohol use: Yes    Alcohol/week: 0.0 oz    Comment: social  . Drug use: No  . Sexual activity: Yes  Other Topics Concern  . Not on file  Social History Narrative   Married.   No children.   Retired.   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    Past Surgical History:  Procedure Laterality  Date  . COLONOSCOPY  2013   cleared for 5 yrs- Dr @ [facility])    Family History  Problem Relation Age of Onset  . Breast cancer Mother   . Lung cancer Father   . Heart disease Father   . Hypertension Father   . Arthritis Maternal Grandmother     Allergies  Allergen Reactions  . Buspirone   . Cephalexin     Current Outpatient Medications on File Prior to Visit  Medication Sig Dispense Refill  . acetaminophen (TYLENOL) 650 MG CR tablet Take 650 mg by mouth every 8 (eight) hours as needed for pain.    Marland Kitchen esomeprazole (NEXIUM) 40 MG capsule TAKE 1 CAPSULE BY MOUTH EVERY DAY 90 capsule 3  . FIBER PO Take by mouth.    . Multiple Vitamin (MULTIVITAMIN) capsule Take 1 capsule by mouth daily.    . sildenafil (VIAGRA) 100 MG tablet TAKE 1 TABLET BY MOUTH DAILY AS NEEDED FOR ERECTILE DYSFUNCTION. (Patient not taking: Reported on 03/20/2017) 18 tablet 3   No current facility-administered medications on file prior to visit.     BP 118/74   Pulse 82   Temp 98 F (36.7 C) (Oral)   Ht 5\' 7"  (1.702 m)   Wt 152 lb 12.8 oz (69.3 kg)   SpO2 97%  BMI 23.93 kg/m    Objective:   Physical Exam  Constitutional: He is oriented to person, place, and time. He appears well-nourished. He appears ill.  HENT:  Nose: Mucosal edema present. Right sinus exhibits maxillary sinus tenderness and frontal sinus tenderness. Left sinus exhibits maxillary sinus tenderness and frontal sinus tenderness.  Neck: Neck supple.  Cardiovascular: Normal rate and regular rhythm.  Pulmonary/Chest: Effort normal and breath sounds normal. He has no wheezes. He has no rales.  Neurological: He is alert and oriented to person, place, and time.  Skin: Skin is warm and dry.  Psychiatric: He has a normal mood and affect.          Assessment & Plan:  Acute Sinusitis:  Sinus pressure, cough, fevers x 1 week. Increased symptoms in last 48 hours. Exam and presentation today consistent for sinusitis. Rx  for Zpak sent to pharmacy.  Fluids, rest, follow up PRN.  Sheral Flow, NP

## 2017-04-08 MED FILL — ESOMEPRAZOLE MAG DR 40 MG C: 40 | 90 days supply | Qty: 90 | Fill #3

## 2017-04-17 ENCOUNTER — Encounter: Payer: Self-pay | Admitting: Primary Care

## 2017-04-17 MED FILL — GAVILYTE-N SOLUTION: 420 | 1 days supply | Qty: 4000 | Fill #0

## 2017-04-17 MED FILL — TRUVADA 200-300 MG TABS: 200-300 | 30 days supply | Qty: 30 | Fill #2

## 2017-04-25 DIAGNOSIS — Z86010 Personal history of colonic polyps: Secondary | ICD-10-CM | POA: Diagnosis not present

## 2017-04-25 DIAGNOSIS — K5730 Diverticulosis of large intestine without perforation or abscess without bleeding: Secondary | ICD-10-CM | POA: Diagnosis not present

## 2017-04-25 LAB — HM COLONOSCOPY

## 2017-05-05 ENCOUNTER — Encounter: Payer: Self-pay | Admitting: Primary Care

## 2017-05-05 DIAGNOSIS — H52223 Regular astigmatism, bilateral: Secondary | ICD-10-CM | POA: Diagnosis not present

## 2017-05-05 DIAGNOSIS — H524 Presbyopia: Secondary | ICD-10-CM | POA: Diagnosis not present

## 2017-05-05 DIAGNOSIS — H5202 Hypermetropia, left eye: Secondary | ICD-10-CM | POA: Diagnosis not present

## 2017-05-09 MED FILL — SILDENAFIL CITRATE 100 MG T: 100 | 90 days supply | Qty: 18 | Fill #3

## 2017-05-29 MED FILL — TRUVADA 200-300 MG TABS: 200-300 | 30 days supply | Qty: 30 | Fill #3

## 2017-06-06 DIAGNOSIS — Z206 Contact with and (suspected) exposure to human immunodeficiency virus [HIV]: Secondary | ICD-10-CM | POA: Insufficient documentation

## 2017-06-11 ENCOUNTER — Ambulatory Visit: Payer: 59 | Attending: Family Medicine | Admitting: Pharmacist

## 2017-06-11 DIAGNOSIS — Z79899 Other long term (current) drug therapy: Secondary | ICD-10-CM

## 2017-06-11 MED ORDER — EMTRICITABINE-TENOFOVIR DF 200-300 MG PO TABS: 1.0000 | ORAL_TABLET | Freq: Every day | ORAL | 6 refills | Status: DC

## 2017-06-11 NOTE — Progress Notes (Signed)
   S: Patient presents to [facility] for review of their specialty medication therapy with his partner, who is also seeing me for review of his specialty medication.   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 Patient is managed by Dr. Ola Spurr for this but will be transitioning over to [facility] to see Dr. Tommy Medal as Dr. Charlesetta Shanks is moving to [place].   Adherence: denies any missed dose. He has been on it for about a year.   Dosing: 1 tablet daily  Monitoring: HIV monitoring: no infection Headache: denies Bone fracture: denies Lactic acidosis: denies Renal toxicity: denies  O:     Lab Results  Component Value Date   WBC 9.0 10/02/2016   HGB 13.4 10/02/2016   HCT 39.6 10/02/2016   MCV 86.6 10/02/2016   PLT 284.0 10/02/2016      Chemistry      Component Value Date/Time   NA 137 07/19/2016 1106   NA 139 06/28/2015 0950   K 3.8 07/19/2016 1106   CL 101 07/19/2016 1106   CO2 27 07/19/2016 1106   BUN 9 07/19/2016 1106   BUN 12 06/28/2015 0950   CREATININE 0.87 07/19/2016 1106   GLU 87 06/27/2014      Component Value Date/Time   CALCIUM 10.1 07/19/2016 1106   ALKPHOS 50 07/19/2016 1106   AST 26 07/19/2016 1106   ALT 23 07/19/2016 1106   BILITOT 0.7 07/19/2016 1106   BILITOT 0.4 06/28/2015 0950        A/P: 1. Medication review: patient is on Truvada for PrEP therapy and is tolerating it well with no adverse effects and no infection with HIV. Reviewed the medication with him, including the following: Truvada is used for the prevention of HIV infection. Strict adherence to the medication is needed to prevent infection. Possible adverse effects include renal dysfunction, increased risk of bone fracture, and headache. No recommendations for any changes.  Christella Hartigan, PharmD, BCPS, BCACP, [facility] [phone]

## 2017-06-30 MED FILL — TRUVADA 200-300 MG TABS: 200-300 | 30 days supply | Qty: 30 | Fill #0

## 2017-07-07 ENCOUNTER — Encounter: Payer: Self-pay | Admitting: Primary Care

## 2017-07-07 ENCOUNTER — Other Ambulatory Visit: Payer: Self-pay | Admitting: Family Medicine

## 2017-07-07 DIAGNOSIS — N529 Male erectile dysfunction, unspecified: Secondary | ICD-10-CM

## 2017-07-07 DIAGNOSIS — K219 Gastro-esophageal reflux disease without esophagitis: Secondary | ICD-10-CM

## 2017-07-07 MED ORDER — ESOMEPRAZOLE MAGNESIUM 40 MG PO CPDR: DELAYED_RELEASE_CAPSULE | ORAL | 0 refills | Status: DC

## 2017-07-07 MED ORDER — SILDENAFIL CITRATE 100 MG PO TABS: ORAL_TABLET | ORAL | 0 refills | Status: DC

## 2017-07-07 MED FILL — ESOMEPRAZOLE MAG DR 40 MG C: 40 | 90 days supply | Qty: 90 | Fill #0

## 2017-07-07 NOTE — Telephone Encounter (Signed)
Received faxed refill request for esomeprazole (NEXIUM) 40 MG capsule and sildenafil (VIAGRA) 100 MG tablet  Both medication have not been prescribed by [facility]. Last seen on 03/20/2017

## 2017-07-14 ENCOUNTER — Ambulatory Visit: Payer: 59 | Admitting: Infectious Disease

## 2017-07-24 MED FILL — SILDENAFIL CITRATE 100 MG T: 100 | 72 days supply | Qty: 12 | Fill #0

## 2017-07-24 MED FILL — TRUVADA 200-300 MG TABS: 200-300 | 30 days supply | Qty: 30 | Fill #1

## 2017-07-29 ENCOUNTER — Other Ambulatory Visit: Payer: Self-pay | Admitting: Primary Care

## 2017-07-29 DIAGNOSIS — I10 Essential (primary) hypertension: Secondary | ICD-10-CM

## 2017-08-08 ENCOUNTER — Other Ambulatory Visit (INDEPENDENT_AMBULATORY_CARE_PROVIDER_SITE_OTHER): Payer: 59

## 2017-08-08 DIAGNOSIS — I10 Essential (primary) hypertension: Secondary | ICD-10-CM | POA: Diagnosis not present

## 2017-08-08 LAB — COMPREHENSIVE METABOLIC PANEL
ALBUMIN: 4.8 g/dL (ref 3.5–5.2)
ALK PHOS: 69 U/L (ref 39–117)
ALT: 20 U/L (ref 0–53)
AST: 21 U/L (ref 0–37)
BILIRUBIN TOTAL: 0.4 mg/dL (ref 0.2–1.2)
BUN: 12 mg/dL (ref 6–23)
CO2: 29 mEq/L (ref 19–32)
CREATININE: 0.92 mg/dL (ref 0.40–1.50)
Calcium: 9.6 mg/dL (ref 8.4–10.5)
Chloride: 101 mEq/L (ref 96–112)
GFR: 88.64 mL/min (ref >60.00)
Glucose, Bld: 92 mg/dL (ref 70–99)
POTASSIUM: 4.1 meq/L (ref 3.5–5.1)
SODIUM: 138 meq/L (ref 135–145)
TOTAL PROTEIN: 7.6 g/dL (ref 6.0–8.3)

## 2017-08-08 LAB — LIPID PANEL
CHOLESTEROL: 151 mg/dL (ref 0–200)
HDL: 40.6 mg/dL (ref >39.00)
LDL Cholesterol: 88 mg/dL (ref 0–99)
NonHDL: 110.75
Total CHOL/HDL Ratio: 4
Triglycerides: 114 mg/dL (ref 0.0–149.0)
VLDL: 22.8 mg/dL (ref 0.0–40.0)

## 2017-08-13 ENCOUNTER — Encounter: Payer: Self-pay | Admitting: Primary Care

## 2017-08-13 ENCOUNTER — Ambulatory Visit (INDEPENDENT_AMBULATORY_CARE_PROVIDER_SITE_OTHER): Payer: 59 | Admitting: Primary Care

## 2017-08-13 VITALS — BP 114/78 | HR 83 | Temp 97.9°F | Ht 67.0 in | Wt 151.0 lb

## 2017-08-13 DIAGNOSIS — G8929 Other chronic pain: Secondary | ICD-10-CM | POA: Diagnosis not present

## 2017-08-13 DIAGNOSIS — M25512 Pain in left shoulder: Secondary | ICD-10-CM

## 2017-08-13 DIAGNOSIS — K219 Gastro-esophageal reflux disease without esophagitis: Secondary | ICD-10-CM | POA: Diagnosis not present

## 2017-08-13 DIAGNOSIS — Z0000 Encounter for general adult medical examination without abnormal findings: Secondary | ICD-10-CM

## 2017-08-13 DIAGNOSIS — R945 Abnormal results of liver function studies: Secondary | ICD-10-CM | POA: Diagnosis not present

## 2017-08-13 DIAGNOSIS — R7989 Other specified abnormal findings of blood chemistry: Secondary | ICD-10-CM

## 2017-08-13 DIAGNOSIS — E7849 Other hyperlipidemia: Secondary | ICD-10-CM | POA: Diagnosis not present

## 2017-08-13 DIAGNOSIS — I10 Essential (primary) hypertension: Secondary | ICD-10-CM | POA: Diagnosis not present

## 2017-08-13 DIAGNOSIS — R351 Nocturia: Secondary | ICD-10-CM | POA: Insufficient documentation

## 2017-08-13 DIAGNOSIS — N529 Male erectile dysfunction, unspecified: Secondary | ICD-10-CM | POA: Diagnosis not present

## 2017-08-13 NOTE — Assessment & Plan Note (Signed)
Recent lipid panel unremarkable. Continue to work on diet and exercise. Continue to monitor.

## 2017-08-13 NOTE — Assessment & Plan Note (Signed)
Immunizations UTD.  PSA UTD. Colonoscopy UTD, due in 2024. Hep C screening negative. Commended him on regular exercise and overall healthy diet. Increase vegetables, fruit, whole grains, lean protein. Exam unremarkable. Labs unremarkable. Follow up in 1 year for CPE.

## 2017-08-13 NOTE — Patient Instructions (Addendum)
Headaches/Sinus Pressure: Try using Flonase (fluticasone) nasal spray. Instill 1 spray in each nostril twice daily.   Consider taking an antihistamine such as Claritin, Zyrtec, Allegra if the sinus headaches persist.   Please update me if you are interested in try Flomax for the urinary symptoms discussed.  Continue exercising. You should be getting 150 minutes of moderate intensity exercise weekly.  Continue to work on a healthy diet.   You will be contacted regarding your referral to physical therapy.  Please let us know if you have not been contacted within one week.   Follow up in 1 year for your annual exam or sooner if needed.  It was a pleasure to see you today!   Preventive Care 40-64 Years, Male Preventive care refers to lifestyle choices and visits with your health care provider that can promote health and wellness. What does preventive care include?  A yearly physical exam. This is also called an annual well check.  Dental exams once or twice a year.  Routine eye exams. Ask your health care provider how often you should have your eyes checked.  Personal lifestyle choices, including: ? Daily care of your teeth and gums. ? Regular physical activity. ? Eating a healthy diet. ? Avoiding tobacco and drug use. ? Limiting alcohol use. ? Practicing safe sex. ? Taking low-dose aspirin every day starting at age 44. What happens during an annual well check? The services and screenings done by your health care provider during your annual well check will depend on your age, overall health, lifestyle risk factors, and family history of disease. Counseling Your health care provider may ask you questions about your:  Alcohol use.  Tobacco use.  Drug use.  Emotional well-being.  Home and relationship well-being.  Sexual activity.  Eating habits.  Work and work Statistician.  Screening You may have the following tests or measurements:  Height, weight, and  BMI.  Blood pressure.  Lipid and cholesterol levels. These may be checked every 5 years, or more frequently if you are over 42 years old.  Skin check.  Lung cancer screening. You may have this screening every year starting at age 38 if you have a 30-pack-year history of smoking and currently smoke or have quit within the past 15 years.  Fecal occult blood test (FOBT) of the stool. You may have this test every year starting at age 79.  Flexible sigmoidoscopy or colonoscopy. You may have a sigmoidoscopy every 5 years or a colonoscopy every 10 years starting at age 21.  Prostate cancer screening. Recommendations will vary depending on your family history and other risks.  Hepatitis C blood test.  Hepatitis B blood test.  Sexually transmitted disease (STD) testing.  Diabetes screening. This is done by checking your blood sugar (glucose) after you have not eaten for a while (fasting). You may have this done every 1-3 years.  Discuss your test results, treatment options, and if necessary, the need for more tests with your health care provider. Vaccines Your health care provider may recommend certain vaccines, such as:  Influenza vaccine. This is recommended every year.  Tetanus, diphtheria, and acellular pertussis (Tdap, Td) vaccine. You may need a Td booster every 10 years.  Varicella vaccine. You may need this if you have not been vaccinated.  Zoster vaccine. You may need this after age 107.  Measles, mumps, and rubella (MMR) vaccine. You may need at least one dose of MMR if you were born in 1957 or later. You may also  need a second dose.  Pneumococcal 13-valent conjugate (PCV13) vaccine. You may need this if you have certain conditions and have not been vaccinated.  Pneumococcal polysaccharide (PPSV23) vaccine. You may need one or two doses if you smoke cigarettes or if you have certain conditions.  Meningococcal vaccine. You may need this if you have certain  conditions.  Hepatitis A vaccine. You may need this if you have certain conditions or if you travel or work in places where you may be exposed to hepatitis A.  Hepatitis B vaccine. You may need this if you have certain conditions or if you travel or work in places where you may be exposed to hepatitis B.  Haemophilus influenzae type b (Hib) vaccine. You may need this if you have certain risk factors.  Talk to your health care provider about which screenings and vaccines you need and how often you need them. This information is not intended to replace advice given to you by your health care provider. Make sure you discuss any questions you have with your health care provider. Document Released: 03/31/2015 Document Revised: 11/22/2015 Document Reviewed: 01/03/2015 Elsevier Interactive Patient Education  [employer].

## 2017-08-13 NOTE — Assessment & Plan Note (Signed)
1-3 times nightly, also with some dribbling and urgency. Discussed options for treatment, he will think about Flomax. PSA unremarkable from 2017.  He will update via My Chart if he is interested in treatment.

## 2017-08-13 NOTE — Assessment & Plan Note (Signed)
Present for the last 6-8 months. Exam today overall unremarkable but some signs of potential mild rotator cuff involvement vs bursitis.   Suspect he will benefit from PT, referral placed for evaluation.

## 2017-08-13 NOTE — Progress Notes (Signed)
Subjective:    Patient ID: James Barry, male    DOB: 1955/10/27, 62 y.o.   MRN: [id number]  HPI  James Barry is a very pleasant 62 year old male who presents today for complete physical.  Overall doing well, does have a few issues to discuss.  Experiencing headaches behind both eyes since pollen season began. He's taking Goody's Powder with improvement. He's not tried anything else for symptoms. He does sleep with a humidifier in his bedroom.   Left shoulder and upper extremity pain with stiffness and decrease in ROM for the past 6-7 months, intermittent with certain movements. Denies injury/trauma.  Experiencing nocturia most nights, anywhere from 1-3 times nightly. Also with occasional dribbling and urgency. He avoids liquids after 7 pm, no caffeine regularly.  Immunizations: -Tetanus: Completed in 2011 -Influenza: Completed last season -Shingles: Completed in July 2018 and September 2019  Diet: He endorses a healthy diet.  Breakfast: Cereal, almond milk, fruit Lunch: Peanut butter sandwich, chips, fiber one bar Dinner: Salad, bacon, egg, toast Snacks: Occasionally  Desserts: Ice cream, lower calorie. Pudding.  Beverages: Water, Diet Soda, unsweet tea   Exercise: Exercising at the [facility] 3 times weekly Eye exam: Completed in February 2019 Dental exam: Completes semi-annually  Colonoscopy: Completed in 2019, due in 2024 PSA: Completed in 2017, negative Hep C Screen: Completed in 2018  BP Readings from Last 3 Encounters:  08/13/17 114/78  03/20/17 118/74  09/04/16 134/68      Review of Systems  Constitutional: Negative for unexpected weight change.  HENT: Negative for rhinorrhea.   Respiratory: Negative for cough and shortness of breath.   Cardiovascular: Negative for chest pain.  Gastrointestinal: Negative for constipation and diarrhea.  Genitourinary: Negative for difficulty urinating.       Nocturia 1-3 times nightly, occasional urgency, occasional dribbling.     Musculoskeletal: Negative for arthralgias and myalgias.  Skin: Negative for rash.  Allergic/Immunologic: Negative for environmental allergies.  Neurological: Negative for dizziness, numbness and headaches.  Psychiatric/Behavioral: The patient is not nervous/anxious.        Past Medical History:  Diagnosis Date  . Diverticulosis   . Erectile dysfunction   . GERD (gastroesophageal reflux disease)   . Hyperlipidemia   . Vertigo      Social History   Socioeconomic History  . Marital status: Single    Spouse name: Not on file  . Number of children: Not on file  . Years of education: Not on file  . Highest education level: Not on file  Occupational History  . Not on file  Social Needs  . Financial resource strain: Not on file  . Food insecurity:    Worry: Not on file    Inability: Not on file  . Transportation needs:    Medical: Not on file    Non-medical: Not on file  Tobacco Use  . Smoking status: Never Smoker  . Smokeless tobacco: Never Used  Substance and Sexual Activity  . Alcohol use: Yes    Alcohol/week: 0.0 oz    Comment: social  . Drug use: No  . Sexual activity: Yes  Lifestyle  . Physical activity:    Days per week: Not on file    Minutes per session: Not on file  . Stress: Not on file  Relationships  . Social connections:    Talks on phone: Not on file    Gets together: Not on file    Attends religious service: Not on file    Active  member of club or organization: Not on file    Attends meetings of clubs or organizations: Not on file    Relationship status: Not on file  . Intimate partner violence:    Fear of current or ex partner: Not on file    Emotionally abused: Not on file    Physically abused: Not on file    Forced sexual activity: Not on file  Other Topics Concern  . Not on file  Social History Narrative   Married.   No children.   Retired.   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    Past  Surgical History:  Procedure Laterality Date  . COLONOSCOPY  2013   cleared for 5 yrs- Dr @ [facility])    Family History  Problem Relation Age of Onset  . Breast cancer Mother   . Lung cancer Father   . Heart disease Father   . Hypertension Father   . Arthritis Maternal Grandmother     Allergies  Allergen Reactions  . Buspirone   . Cephalexin     Current Outpatient Medications on File Prior to Visit  Medication Sig Dispense Refill  . acetaminophen (TYLENOL) 650 MG CR tablet Take 650 mg by mouth every 8 (eight) hours as needed for pain.    Marland Kitchen emtricitabine-tenofovir (TRUVADA) 200-300 MG tablet Take 1 tablet by mouth daily. 30 tablet 6  . esomeprazole (NEXIUM) 40 MG capsule TAKE 1 CAPSULE BY MOUTH EVERY DAY 90 capsule 0  . FIBER PO Take by mouth.    . Multiple Vitamin (MULTIVITAMIN) capsule Take 1 capsule by mouth daily.    . sildenafil (VIAGRA) 100 MG tablet TAKE 1 TABLET BY MOUTH DAILY AS NEEDED FOR ERECTILE DYSFUNCTION. 12 tablet 0   No current facility-administered medications on file prior to visit.     BP 114/78 (BP Location: Left Arm, Patient Position: Sitting, Cuff Size: Normal)   Pulse 83   Temp 97.9 F (36.6 C) (Oral)   Ht 5\' 7"  (1.702 m)   Wt 151 lb (68.5 kg)   SpO2 95%   BMI 23.65 kg/m    Objective:   Physical Exam  Constitutional: He is oriented to person, place, and time. He appears well-nourished.  HENT:  Mouth/Throat: No oropharyngeal exudate.  Eyes: Pupils are equal, round, and reactive to light. EOM are normal.  Neck: Neck supple. No thyromegaly present.  Cardiovascular: Normal rate and regular rhythm.  Respiratory: Effort normal and breath sounds normal.  GI: Soft. Bowel sounds are normal. There is no tenderness.  Musculoskeletal:       Left shoulder: He exhibits decreased range of motion and pain. He exhibits no tenderness.  Mild decrease in ROM with forward abduction. Pain with posterior abduction. 5/5 strength to bilateral  upper extremities.  Neurological: He is alert and oriented to person, place, and time.  Skin: Skin is warm and dry.  Psychiatric: He has a normal mood and affect.           Assessment & Plan:

## 2017-08-13 NOTE — Assessment & Plan Note (Signed)
LFT's unremarkable on recent labs. Continue to monitor.

## 2017-08-13 NOTE — Assessment & Plan Note (Signed)
Using Sildenafil 1-2 times weekly on average. Continue same.

## 2017-08-13 NOTE — Assessment & Plan Note (Signed)
Stable on Nexium 40 mg daily, feels well managed. Cannot go off of Nexium as he will experience symptoms.

## 2017-08-13 NOTE — Assessment & Plan Note (Signed)
Stable in the office today, continue to monitor.  

## 2017-08-26 MED FILL — TRUVADA 200-300 MG TABS: 200-300 | 30 days supply | Qty: 30 | Fill #2

## 2017-09-15 ENCOUNTER — Other Ambulatory Visit: Payer: Self-pay | Admitting: Primary Care

## 2017-09-15 DIAGNOSIS — N529 Male erectile dysfunction, unspecified: Secondary | ICD-10-CM

## 2017-09-15 NOTE — Telephone Encounter (Signed)
Last filled 07/07/2017... Please advise

## 2017-09-23 ENCOUNTER — Ambulatory Visit: Payer: 59 | Admitting: Infectious Disease

## 2017-09-23 ENCOUNTER — Encounter: Payer: Self-pay | Admitting: Infectious Disease

## 2017-09-23 ENCOUNTER — Other Ambulatory Visit (HOSPITAL_COMMUNITY)
Admission: RE | Admit: 2017-09-23 | Discharge: 2017-09-23 | Disposition: A | Discharge status: Home / Self Care | Payer: 59 | Source: Ambulatory Visit | Attending: Infectious Disease | Admitting: Infectious Disease

## 2017-09-23 VITALS — BP 127/82 | HR 78 | Temp 98.2°F | Wt 144.1 lb

## 2017-09-23 DIAGNOSIS — Z113 Encounter for screening for infections with a predominantly sexual mode of transmission: Secondary | ICD-10-CM | POA: Diagnosis not present

## 2017-09-23 DIAGNOSIS — Z7252 High risk homosexual behavior: Secondary | ICD-10-CM

## 2017-09-23 DIAGNOSIS — Z23 Encounter for immunization: Secondary | ICD-10-CM

## 2017-09-23 NOTE — Addendum Note (Signed)
Addended by: Aundria Rud on: 09/23/2017 04:01 PM   Modules accepted: Orders

## 2017-09-23 NOTE — Progress Notes (Signed)
Subjective:   Chief complaint: James Barry is here with his husband James Barry  for establishing care for pre-exposure prophylaxis   Patient ID: WOLFE CAMARENA, male    DOB: [birth year], 62 y.o.   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  James Barry himself as not tested positive for STI recently.  He and his husband both have receptive and insertive anal intercourse they have mutual oral genital intercourse.  They do have relations with others outside of their marriage.  James Barry has had serologies checked for hepatitis A and B to which she does not have antibodies and I do not see documentation of immunization against these 2 viruses.  He has no symptoms of fevers chills myalgias rash or anything else to suggest acute HIV infection.  Past Medical History:  Diagnosis Date  . Diverticulosis   . Erectile dysfunction   . GERD (gastroesophageal reflux disease)   . Hyperlipidemia   . Vertigo     Past Surgical History:  Procedure Laterality Date  . COLONOSCOPY  2013   cleared for 5 yrs- Dr @ [facility])    Family History  Problem Relation Age of Onset  . Breast cancer Mother   . Lung cancer Father   . Heart disease Father   . Hypertension Father   . Arthritis Maternal Grandmother       Social History   Socioeconomic History  . Marital status: Single    Spouse name: Not on file  . Number of children: Not on file  . Years of education: Not on file  . Highest education level: Not on file  Occupational History  . Not on file  Social Needs  . Financial resource strain: Not on file  . Food insecurity:    Worry: Not on file    Inability: Not on file  . Transportation needs:    Medical: Not on  file    Non-medical: Not on file  Tobacco Use  . Smoking status: Never Smoker  . Smokeless tobacco: Never Used  Substance and Sexual Activity  . Alcohol use: Yes    Alcohol/week: 0.0 oz    Comment: social  . Drug use: No  . Sexual activity: Yes  Lifestyle  . Physical activity:    Days per week: Not on file    Minutes per session: Not on file  . Stress: Not on file  Relationships  . Social connections:    Talks on phone: Not on file    Gets together: Not on file    Attends religious service: Not on file    Active member of club or organization: Not on file    Attends meetings of clubs or organizations: Not on file    Relationship status: Not on file  Other Topics Concern  . Not on file  Social History Narrative   Married.   No children.   Retired.   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    Allergies  Allergen Reactions  . Buspirone   . Cephalexin      Current Outpatient Medications:  .  acetaminophen (TYLENOL) 650 MG CR tablet, Take 650 mg by mouth every 8 (eight) hours  as needed for pain., Disp: , Rfl:  .  emtricitabine-tenofovir (TRUVADA) 200-300 MG tablet, Take 1 tablet by mouth daily., Disp: 30 tablet, Rfl: 6 .  esomeprazole (NEXIUM) 40 MG capsule, TAKE 1 CAPSULE BY MOUTH EVERY DAY, Disp: 90 capsule, Rfl: 0 .  FIBER PO, Take by mouth., Disp: , Rfl:  .  Multiple Vitamin (MULTIVITAMIN) capsule, Take 1 capsule by mouth daily., Disp: , Rfl:  .  sildenafil (VIAGRA) 100 MG tablet, TAKE 1 TABLET BY MOUTH ONCE DAILY FOR ERECTILE DYSFUNCTION, Disp: 12 tablet, Rfl: 0    Review of Systems  Constitutional: Negative for activity change, appetite change, chills, diaphoresis, fatigue, fever and unexpected weight change.  HENT: Negative for congestion, rhinorrhea, sinus pressure, sneezing, sore throat and trouble swallowing.   Eyes: Negative for photophobia and visual disturbance.  Respiratory: Negative for cough, chest tightness,  shortness of breath, wheezing and stridor.   Cardiovascular: Negative for chest pain, palpitations and leg swelling.  Gastrointestinal: Negative for abdominal distention, abdominal pain, anal bleeding, blood in stool, constipation, diarrhea, nausea and vomiting.  Genitourinary: Negative for difficulty urinating, dysuria, flank pain and hematuria.  Musculoskeletal: Negative for arthralgias, back pain, gait problem, joint swelling and myalgias.  Skin: Negative for color change, pallor, rash and wound.  Neurological: Negative for dizziness, tremors, weakness and light-headedness.  Hematological: Negative for adenopathy. Does not bruise/bleed easily.  Psychiatric/Behavioral: Negative for agitation, behavioral problems, confusion, decreased concentration, dysphoric mood and sleep disturbance.       Objective:   Physical Exam  Constitutional: He is oriented to person, place, and time. No distress.  HENT:  Head: Normocephalic and atraumatic.  Right Ear: External ear normal.  Left Ear: External ear normal.  Nose: Nose normal.  Mouth/Throat: Oropharynx is clear and moist. No oropharyngeal exudate.  Eyes: Pupils are equal, round, and reactive to light. Conjunctivae and EOM are normal. No scleral icterus.  Neck: Normal range of motion. Neck supple.  Cardiovascular: Normal rate and regular rhythm.  Pulmonary/Chest: Effort normal. No respiratory distress. He has no wheezes.  Abdominal: Soft. He exhibits no distension.  Musculoskeletal: Normal range of motion. He exhibits no edema or tenderness.  Neurological: He is alert and oriented to person, place, and time. Coordination normal.  Skin: Skin is warm and dry. No rash noted. He is not diaphoretic. No erythema. No pallor.  Psychiatric: He has a normal mood and affect. His behavior is normal. Judgment and thought content normal.          Assessment & Plan:   Need for HIV preexposure prophylaxis: Being married to another man who has had  syphilis and chlamydia certainly puts him at risk for contracting HIV.  Also certainly having other partners outside of their marriage who may or may not have HIV would put them both at risk.  Therefore is clearly important to prevent HIV with Truvada for preexposure prophylaxis  We will check a metabolic panel today as well as an HIV antibody.  If creatinine is normal and HIV antibody is negative he will also get a 5-month prescription for Truvada.  We will vaccinate James Barry against hepatitis A and B he should come back in 1 month's time to get a second hep B vaccine.  Screen or syphilis as well as gonorrhea chlamydia gentle an extra genital sites.

## 2017-09-25 ENCOUNTER — Other Ambulatory Visit: Payer: Self-pay | Admitting: Primary Care

## 2017-09-25 DIAGNOSIS — K219 Gastro-esophageal reflux disease without esophagitis: Secondary | ICD-10-CM

## 2017-09-25 LAB — CYTOLOGY, (ORAL, ANAL, URETHRAL) ANCILLARY ONLY
CHLAMYDIA, DNA PROBE: NEGATIVE
CHLAMYDIA, DNA PROBE: NEGATIVE
Neisseria Gonorrhea: NEGATIVE
Neisseria Gonorrhea: NEGATIVE

## 2017-09-25 LAB — URINE CYTOLOGY ANCILLARY ONLY
Chlamydia: NEGATIVE
NEISSERIA GONORRHEA: NEGATIVE

## 2017-09-25 MED FILL — ESOMEPRAZOLE MAG DR 40 MG C: 40 | 90 days supply | Qty: 90 | Fill #0

## 2017-09-25 MED FILL — TRUVADA 200-300 MG TABS: 200-300 | 30 days supply | Qty: 30 | Fill #3

## 2017-09-25 MED FILL — SILDENAFIL CITRATE 100 MG T: 100 | 60 days supply | Qty: 12 | Fill #0

## 2017-09-26 ENCOUNTER — Other Ambulatory Visit: Payer: Self-pay | Admitting: Infectious Disease

## 2017-09-26 MED ORDER — EMTRICITABINE-TENOFOVIR DF 200-300 MG PO TABS: 1.0000 | ORAL_TABLET | Freq: Every day | ORAL | 2 refills | Status: DC

## 2017-09-26 NOTE — Progress Notes (Signed)
pts HIV test negative and he can continue  Truvada with 2 refills.

## 2017-09-30 ENCOUNTER — Other Ambulatory Visit: Payer: Self-pay | Admitting: Infectious Disease

## 2017-09-30 LAB — BASIC METABOLIC PANEL WITH GFR
BUN: 15 mg/dL (ref 7–25)
CALCIUM: 9.4 mg/dL (ref 8.6–10.3)
CO2: 24 mmol/L (ref 20–32)
Chloride: 104 mmol/L (ref 98–110)
Creat: 1.05 mg/dL (ref 0.70–1.25)
GFR, EST AFRICAN AMERICAN: 88 mL/min/{1.73_m2} (ref >=60)
GFR, Est Non African American: 76 mL/min/{1.73_m2} (ref >=60)
GLUCOSE: 93 mg/dL (ref 65–99)
POTASSIUM: 3.9 mmol/L (ref 3.5–5.3)
Sodium: 138 mmol/L (ref 135–146)

## 2017-09-30 LAB — HIV-1/2 AB - DIFFERENTIATION
HIV 2 AB: NEGATIVE
HIV-1 antibody: NEGATIVE

## 2017-09-30 LAB — HIV-1 RNA, QUALITATIVE, TMA: HIV-1 RNA, Qualitative, TMA: NOT DETECTED

## 2017-09-30 LAB — RPR: RPR Ser Ql: NONREACTIVE

## 2017-09-30 LAB — HIV ANTIBODY (ROUTINE TESTING W REFLEX): HIV: REACTIVE — AB

## 2017-09-30 NOTE — Progress Notes (Signed)
HIV EIA is false +

## 2017-10-06 ENCOUNTER — Encounter: Payer: Self-pay | Admitting: Infectious Disease

## 2017-10-07 ENCOUNTER — Telehealth: Payer: Self-pay | Admitting: *Deleted

## 2017-10-07 ENCOUNTER — Encounter: Payer: Self-pay | Admitting: Primary Care

## 2017-10-07 NOTE — Telephone Encounter (Signed)
Spoke with Annie Main, relayed FALSE POSITIVE test result.  He is negative for HIV, confirmed by Dr Hubert Azure, Lanice Schwab, RN

## 2017-10-07 NOTE — Telephone Encounter (Signed)
Does he need to discuss further or does he want a repeat lab done. He may have false + EIA before but it is harder for me to see that in [place] records

## 2017-10-07 NOTE — Telephone Encounter (Signed)
Ok very good.

## 2017-10-07 NOTE — Telephone Encounter (Signed)
He was fine - MUCH relieved. He is ok to follow the current plan to test every 3 months for PREP.

## 2017-10-07 NOTE — Telephone Encounter (Signed)
Noted  

## 2017-10-08 ENCOUNTER — Encounter: Payer: Self-pay | Admitting: Primary Care

## 2017-10-13 ENCOUNTER — Ambulatory Visit: Payer: 59 | Admitting: Primary Care

## 2017-10-13 ENCOUNTER — Encounter

## 2017-10-24 ENCOUNTER — Ambulatory Visit: Payer: 59

## 2017-10-27 ENCOUNTER — Ambulatory Visit (INDEPENDENT_AMBULATORY_CARE_PROVIDER_SITE_OTHER): Payer: 59 | Admitting: *Deleted

## 2017-10-27 DIAGNOSIS — Z23 Encounter for immunization: Secondary | ICD-10-CM

## 2017-10-27 MED FILL — TRUVADA 200-300 MG TABS: 200-300 | 30 days supply | Qty: 30 | Fill #4

## 2017-11-05 ENCOUNTER — Other Ambulatory Visit: Payer: Self-pay | Admitting: Primary Care

## 2017-11-05 DIAGNOSIS — N529 Male erectile dysfunction, unspecified: Secondary | ICD-10-CM

## 2017-11-12 MED FILL — SILDENAFIL CITRATE 100 MG T: 100 | 60 days supply | Qty: 12 | Fill #0

## 2017-11-26 MED FILL — TRUVADA 200-300 MG TABS: 200-300 | 30 days supply | Qty: 30 | Fill #5

## 2017-12-03 ENCOUNTER — Other Ambulatory Visit: Payer: 59

## 2017-12-03 DIAGNOSIS — Z114 Encounter for screening for human immunodeficiency virus [HIV]: Secondary | ICD-10-CM

## 2017-12-04 LAB — HIV ANTIBODY (ROUTINE TESTING W REFLEX): HIV 1&2 Ab, 4th Generation: NONREACTIVE

## 2017-12-09 ENCOUNTER — Ambulatory Visit: Payer: 59 | Admitting: Infectious Disease

## 2017-12-17 ENCOUNTER — Encounter: Payer: Self-pay | Admitting: Family

## 2017-12-17 ENCOUNTER — Ambulatory Visit: Payer: 59 | Admitting: Family

## 2017-12-17 ENCOUNTER — Other Ambulatory Visit: Payer: Self-pay | Admitting: Pharmacist

## 2017-12-17 ENCOUNTER — Other Ambulatory Visit (HOSPITAL_COMMUNITY)
Admission: RE | Admit: 2017-12-17 | Discharge: 2017-12-17 | Disposition: A | Discharge status: Home / Self Care | Payer: 59 | Source: Ambulatory Visit | Attending: Infectious Disease | Admitting: Infectious Disease

## 2017-12-17 VITALS — BP 124/83 | HR 73 | Temp 98.1°F | Wt 150.8 lb

## 2017-12-17 DIAGNOSIS — Z113 Encounter for screening for infections with a predominantly sexual mode of transmission: Secondary | ICD-10-CM

## 2017-12-17 DIAGNOSIS — Z79899 Other long term (current) drug therapy: Secondary | ICD-10-CM | POA: Insufficient documentation

## 2017-12-17 MED ORDER — EMTRICITABINE-TENOFOVIR DF 200-300 MG PO TABS: 1.0000 | ORAL_TABLET | Freq: Every day | ORAL | 2 refills | Status: DC

## 2017-12-17 NOTE — Patient Instructions (Signed)
Nice to meet you.   We will screen you for STI today.  Continue to take the Truvada as prescribed.   Plan for office follow up office visit in 3 months or sooner if needed.

## 2017-12-17 NOTE — Assessment & Plan Note (Signed)
Mr. Halls continues to take his Truvada as prescribed and most recently had a negative HIV test. He is adherent to the medication regimen and remains sexually active with increased risk of acquiring HIV through multiple partners. Counseled on importance of using protection to avoid transmission as well as transmission of STI. Will check lab work today for therapeutic drug monitoring. Continue current dosage of Truvada. Follow up in 3 months or sooner if needed.

## 2017-12-17 NOTE — Progress Notes (Signed)
Subjective:    Patient ID: BLAYZE HAEN, male    DOB: 09-Jan-1956, 62 y.o.   MRN: [id number]  Chief Complaint  Patient presents with  . PrEP     HPI:  MARVIS SAEFONG is a 62 y.o. male who presents today for routine follow-up of for PrEP.  Mr. Slaymaker was last seen in the office on 09/23/2017 to establish care for PrEP and was started on Truvada. He completed HIV screening on 12/03/17 which was negative. He is due for a basic metabolic panel today.  Mr. Marxen continues to take his Truvada as prescribed with no adverse side effects or missed doses. He has no problems obtaining his medications. He does remain sexually active both in and out of his relationship with new partners. He denies any current symptoms.    Allergies  Allergen Reactions  . Buspirone   . Cephalexin       Outpatient Medications Prior to Visit  Medication Sig Dispense Refill  . acetaminophen (TYLENOL) 650 MG CR tablet Take 650 mg by mouth every 8 (eight) hours as needed for pain.    Marland Kitchen esomeprazole (NEXIUM) 40 MG capsule TAKE 1 CAPSULE BY MOUTH EVERY DAY 90 capsule 1  . FIBER PO Take by mouth.    . Multiple Vitamin (MULTIVITAMIN) capsule Take 1 capsule by mouth daily.    . sildenafil (VIAGRA) 100 MG tablet TAKE 1 TABLET BY MOUTH ONCE DAILY FOR ERECTILE DYSFUNCTION 12 tablet 0  . emtricitabine-tenofovir (TRUVADA) 200-300 MG tablet Take 1 tablet by mouth daily. 30 tablet 2   No facility-administered medications prior to visit.      Past Medical History:  Diagnosis Date  . Diverticulosis   . Erectile dysfunction   . GERD (gastroesophageal reflux disease)   . Hyperlipidemia   . Vertigo      Past Surgical History:  Procedure Laterality Date  . COLONOSCOPY  2013   cleared for 5 yrs- Dr @ [facility])       Review of Systems  Constitutional: Negative for appetite change, chills, fatigue, fever and unexpected weight change.  Eyes: Negative for visual disturbance.  Respiratory: Negative  for cough, chest tightness, shortness of breath and wheezing.   Cardiovascular: Negative for chest pain and leg swelling.  Gastrointestinal: Negative for abdominal pain, constipation, diarrhea, nausea and vomiting.  Genitourinary: Negative for dysuria, flank pain, frequency, genital sores, hematuria and urgency.  Skin: Negative for rash.  Allergic/Immunologic: Negative for immunocompromised state.  Neurological: Negative for dizziness and headaches.      Objective:    BP 124/83   Pulse 73   Temp 98.1 F (36.7 C)   Wt 150 lb 12.8 oz (68.4 kg)   BMI 23.62 kg/m  Nursing note and vital signs reviewed.  Physical Exam  Constitutional: He is oriented to person, place, and time. He appears well-developed. No distress.  HENT:  Mouth/Throat: Oropharynx is clear and moist.  Eyes: Conjunctivae are normal.  Neck: Neck supple.  Cardiovascular: Normal rate, regular rhythm, normal heart sounds and intact distal pulses. Exam reveals no gallop and no friction rub.  No murmur heard. Pulmonary/Chest: Effort normal and breath sounds normal. No respiratory distress. He has no wheezes. He has no rales. He exhibits no tenderness.  Abdominal: Soft. Bowel sounds are normal. There is no tenderness.  Lymphadenopathy:    He has no cervical adenopathy.  Neurological: He is alert and oriented to person, place, and time.  Skin: Skin is warm and dry. No rash noted.  Psychiatric: He has a normal mood and affect. His behavior is normal. Judgment and thought content normal.       Assessment & Plan:   Problem List Items Addressed This Visit      Other   Routine screening for STI (sexually transmitted infection) - Primary    On PrEP and continues to be high risk for acquiring STI. Will check lab work today for continued monitoring. Counseled on importance of using protection and safe sexual practice.       Relevant Orders   Cytology (oral, anal, urethral) ancillary only   Cytology (oral, anal, urethral)  ancillary only   Cytology (oral, anal, urethral) ancillary only   RPR   Comprehensive metabolic panel   On pre-exposure prophylaxis for HIV    Mr. Scialdone continues to take his Truvada as prescribed and most recently had a negative HIV test. He is adherent to the medication regimen and remains sexually active with increased risk of acquiring HIV through multiple partners. Counseled on importance of using protection to avoid transmission as well as transmission of STI. Will check lab work today for therapeutic drug monitoring. Continue current dosage of Truvada. Follow up in 3 months or sooner if needed.           I have discontinued Farhaan Mabee. Bunda's emtricitabine-tenofovir. I am also having him maintain his multivitamin, FIBER PO, acetaminophen, esomeprazole, and sildenafil.   Meds ordered this encounter  Medications  . DISCONTD: emtricitabine-tenofovir (TRUVADA) 200-300 MG tablet    Sig: Take 1 tablet by mouth daily.    Dispense:  30 tablet    Refill:  2    Order Specific Question:   Supervising Provider    Answer:   Carlyle Basques [[id number]]     Follow-up: Return in about 3 months (around 03/19/2018), or if symptoms worsen or fail to improve.   Terri Piedra, MSN, FNP-C Nurse Practitioner [facility] Office phone: [phone] Pager: [place] number: [phone]

## 2017-12-17 NOTE — Assessment & Plan Note (Signed)
On PrEP and continues to be high risk for acquiring STI. Will check lab work today for continued monitoring. Counseled on importance of using protection and safe sexual practice.

## 2017-12-18 ENCOUNTER — Ambulatory Visit (INDEPENDENT_AMBULATORY_CARE_PROVIDER_SITE_OTHER): Payer: 59

## 2017-12-18 DIAGNOSIS — Z23 Encounter for immunization: Secondary | ICD-10-CM

## 2017-12-18 LAB — COMPREHENSIVE METABOLIC PANEL
AG RATIO: 1.7 (calc) (ref 1.0–2.5)
ALKALINE PHOSPHATASE (APISO): 67 U/L (ref 40–115)
ALT: 19 U/L (ref 9–46)
AST: 20 U/L (ref 10–35)
Albumin: 4.7 g/dL (ref 3.6–5.1)
BILIRUBIN TOTAL: 0.4 mg/dL (ref 0.2–1.2)
BUN: 17 mg/dL (ref 7–25)
CALCIUM: 10 mg/dL (ref 8.6–10.3)
CO2: 29 mmol/L (ref 20–32)
Chloride: 100 mmol/L (ref 98–110)
Creat: 1.08 mg/dL (ref 0.70–1.25)
Globulin: 2.7 g/dL (calc) (ref 1.9–3.7)
Glucose, Bld: 92 mg/dL (ref 65–99)
Potassium: 4.4 mmol/L (ref 3.5–5.3)
SODIUM: 137 mmol/L (ref 135–146)
TOTAL PROTEIN: 7.4 g/dL (ref 6.1–8.1)

## 2017-12-18 LAB — RPR: RPR Ser Ql: NONREACTIVE

## 2017-12-19 LAB — CYTOLOGY, (ORAL, ANAL, URETHRAL) ANCILLARY ONLY
CHLAMYDIA, DNA PROBE: NEGATIVE
Chlamydia: NEGATIVE
Chlamydia: NEGATIVE
NEISSERIA GONORRHEA: NEGATIVE
Neisseria Gonorrhea: NEGATIVE
Neisseria Gonorrhea: NEGATIVE

## 2017-12-23 ENCOUNTER — Other Ambulatory Visit: Payer: Self-pay | Admitting: Family

## 2017-12-23 ENCOUNTER — Other Ambulatory Visit: Payer: Self-pay | Admitting: Pharmacist

## 2017-12-23 DIAGNOSIS — Z79899 Other long term (current) drug therapy: Secondary | ICD-10-CM

## 2017-12-23 MED ORDER — EMTRICITABINE-TENOFOVIR AF 200-25 MG PO TABS: 1.0000 | ORAL_TABLET | Freq: Every day | ORAL | 3 refills | Status: DC

## 2017-12-23 MED FILL — TRUVADA 200-300 MG TABS: 200-300 | 30 days supply | Qty: 30 | Fill #0

## 2017-12-24 ENCOUNTER — Other Ambulatory Visit: Payer: Self-pay | Admitting: Primary Care

## 2017-12-24 DIAGNOSIS — N529 Male erectile dysfunction, unspecified: Secondary | ICD-10-CM

## 2017-12-24 MED FILL — ESOMEPRAZOLE MAG DR 40 MG C: 40 | 90 days supply | Qty: 90 | Fill #1

## 2017-12-24 MED FILL — SILDENAFIL CITRATE 100 MG T: 100 | 6 days supply | Qty: 6 | Fill #0

## 2017-12-24 NOTE — Telephone Encounter (Signed)
Noted, refill sent to pharmacy. 

## 2017-12-24 NOTE — Telephone Encounter (Signed)
Last prescribed on 10/03/2017 Last office visit on 08/13/2017

## 2017-12-31 DIAGNOSIS — L720 Epidermal cyst: Secondary | ICD-10-CM | POA: Diagnosis not present

## 2017-12-31 DIAGNOSIS — D1800 Hemangioma unspecified site: Secondary | ICD-10-CM | POA: Diagnosis not present

## 2018-01-13 ENCOUNTER — Other Ambulatory Visit: Payer: Self-pay | Admitting: Primary Care

## 2018-01-13 DIAGNOSIS — N529 Male erectile dysfunction, unspecified: Secondary | ICD-10-CM

## 2018-01-13 MED FILL — SILDENAFIL CITRATE 100 MG T: 100 | 30 days supply | Qty: 6 | Fill #1

## 2018-01-20 MED FILL — TRUVADA 200-300 MG TABS: 200-300 | 30 days supply | Qty: 30 | Fill #1

## 2018-01-26 ENCOUNTER — Encounter: Payer: Self-pay | Admitting: Primary Care

## 2018-01-26 ENCOUNTER — Ambulatory Visit (INDEPENDENT_AMBULATORY_CARE_PROVIDER_SITE_OTHER): Payer: 59 | Admitting: Primary Care

## 2018-01-26 VITALS — BP 120/82 | HR 87 | Temp 98.0°F | Ht 67.0 in | Wt 154.8 lb

## 2018-01-26 DIAGNOSIS — J069 Acute upper respiratory infection, unspecified: Secondary | ICD-10-CM | POA: Diagnosis not present

## 2018-01-26 MED ORDER — HYDROCOD POLST-CPM POLST ER 10-8 MG/5ML PO SUER: 5.0000 mL | Freq: Two times a day (BID) | ORAL | 0 refills | Status: DC | PRN

## 2018-01-26 MED ORDER — AZITHROMYCIN 250 MG PO TABS: ORAL_TABLET | ORAL | 0 refills | Status: DC

## 2018-01-26 MED FILL — HYDROCODONE-CHLORPHEN ER SU: 10-8 | 5 days supply | Qty: 50 | Fill #0

## 2018-01-26 MED FILL — AZITHROMYCIN 250 MG TABLET: 250 | 5 days supply | Qty: 6 | Fill #0

## 2018-01-26 NOTE — Patient Instructions (Signed)
Start Azithromycin antibiotics for infection. Take 2 tablets by mouth today, then 1 tablet daily for 4 additional days.  You may take the Tussionex cough suppressant every 12 hours as needed for cough and rest. Caution this medication contains codeine and will make you feel drowsy.  You may take Dayquil during the day if needed.   Nasal Congestion/Ear Pressure: Try using Flonase (fluticasone) nasal spray. Instill 1 spray in each nostril twice daily.   It was a pleasure to see you today!

## 2018-01-26 NOTE — Progress Notes (Signed)
Subjective:    Patient ID: James Barry, male    DOB: 11-02-1955, 62 y.o.   MRN: [id number]  HPI  James Barry is a 62 year old male with a history of GERD and sinusitis who presents today with a chief complaint of nasal congestion.  He also reports sinus pressure, dental pain, post nasal drip, chills, ear pain, cough, sore throat. His symptoms began 3-4 days ago. He's been taking Nyquil, Dayquil with some improvement. He has a history of sinusitis that will occur during seasonal changes. This feels like his prior sinus infections.   Review of Systems  Constitutional: Positive for chills. Negative for fever.  HENT: Positive for congestion, ear pain, postnasal drip, sinus pressure and sore throat.   Respiratory: Positive for cough.        Past Medical History:  Diagnosis Date  . Diverticulosis   . Erectile dysfunction   . GERD (gastroesophageal reflux disease)   . Hyperlipidemia   . Vertigo      Social History   Socioeconomic History  . Marital status: Married    Spouse name: Not on file  . Number of children: Not on file  . Years of education: Not on file  . Highest education level: Not on file  Occupational History  . Not on file  Social Needs  . Financial resource strain: Not on file  . Food insecurity:    Worry: Not on file    Inability: Not on file  . Transportation needs:    Medical: Not on file    Non-medical: Not on file  Tobacco Use  . Smoking status: Never Smoker  . Smokeless tobacco: Never Used  Substance and Sexual Activity  . Alcohol use: Yes    Alcohol/week: 0.0 standard drinks    Comment: social  . Drug use: No  . Sexual activity: Yes  Lifestyle  . Physical activity:    Days per week: Not on file    Minutes per session: Not on file  . Stress: Not on file  Relationships  . Social connections:    Talks on phone: Not on file    Gets together: Not on file    Attends religious service: Not on file    Active member of club or organization: Not on  file    Attends meetings of clubs or organizations: Not on file    Relationship status: Not on file  . Intimate partner violence:    Fear of current or ex partner: Not on file    Emotionally abused: Not on file    Physically abused: Not on file    Forced sexual activity: Not on file  Other Topics Concern  . Not on file  Social History Narrative   Married.   No children.   Retired.   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    Past Surgical History:  Procedure Laterality Date  . COLONOSCOPY  2013   cleared for 5 yrs- Dr @ [facility])    Family History  Problem Relation Age of Onset  . Breast cancer Mother   . Lung cancer Father   . Heart disease Father   . Hypertension Father   . Arthritis Maternal Grandmother     Allergies  Allergen Reactions  . Buspirone   . Cephalexin     Current Outpatient Medications on File Prior to Visit  Medication Sig Dispense Refill  . acetaminophen (TYLENOL) 650 MG CR tablet Take 650  mg by mouth every 8 (eight) hours as needed for pain.    Marland Kitchen emtricitabine-tenofovir AF (DESCOVY) 200-25 MG tablet Take 1 tablet by mouth daily. 30 tablet 3  . esomeprazole (NEXIUM) 40 MG capsule TAKE 1 CAPSULE BY MOUTH EVERY DAY 90 capsule 1  . FIBER PO Take by mouth.    . Multiple Vitamin (MULTIVITAMIN) capsule Take 1 capsule by mouth daily.    . sildenafil (VIAGRA) 100 MG tablet TAKE 1 TABLET BY MOUTH ONCE DAILY FOR ERECTILE DYSFUNCTION 90 tablet 0   No current facility-administered medications on file prior to visit.     BP 120/82   Pulse 87   Temp 98 F (36.7 C) (Oral)   Ht 5\' 7"  (1.702 m)   Wt 154 lb 12 oz (70.2 kg)   SpO2 97%   BMI 24.24 kg/m    Objective:   Physical Exam  Constitutional: He appears well-nourished. He does not appear ill.  HENT:  Right Ear: Tympanic membrane and ear canal normal.  Left Ear: Tympanic membrane and ear canal normal.  Nose: Mucosal edema present. Right sinus  exhibits maxillary sinus tenderness and frontal sinus tenderness. Left sinus exhibits maxillary sinus tenderness and frontal sinus tenderness.  Mouth/Throat: Oropharynx is clear and moist.  Bilateral TMs with dullness  Neck: Neck supple.  Enlarged lymph node to right submandibular region  Cardiovascular: Normal rate and regular rhythm.  Respiratory: Effort normal and breath sounds normal. He has no wheezes.  Lymphadenopathy:    He has cervical adenopathy.  Skin: Skin is warm and dry.           Assessment & Plan:  URI:  Cough, congestion, sinus pressure, chills x 3-4 days. Exam today consistent for viral URI vs early sinusitis.  Discussed with patient today who endorses that this feels exactly like his prior sinus infections.  Will provide him with a Zpack today to use if no improvement in symptoms.  Rx for Tussionex cough suppressant sent to pharmacy.  Discussed Flonase, Dayquil PRN.  James Koch, NP

## 2018-02-09 MED FILL — SILDENAFIL CITRATE 100 MG T: 100 | 90 days supply | Qty: 90 | Fill #0

## 2018-02-26 MED FILL — TRUVADA 200-300 MG TABS: 200-300 | 30 days supply | Qty: 30 | Fill #2

## 2018-03-19 ENCOUNTER — Ambulatory Visit: Payer: 59 | Admitting: Family

## 2018-03-19 ENCOUNTER — Other Ambulatory Visit (HOSPITAL_COMMUNITY)
Admission: RE | Admit: 2018-03-19 | Discharge: 2018-03-19 | Disposition: A | Discharge status: Home / Self Care | Payer: 59 | Source: Ambulatory Visit | Attending: Family | Admitting: Family

## 2018-03-19 ENCOUNTER — Encounter: Payer: Self-pay | Admitting: Family

## 2018-03-19 VITALS — BP 134/88 | HR 74 | Temp 97.7°F | Wt 156.1 lb

## 2018-03-19 DIAGNOSIS — Z113 Encounter for screening for infections with a predominantly sexual mode of transmission: Secondary | ICD-10-CM | POA: Insufficient documentation

## 2018-03-19 DIAGNOSIS — Z23 Encounter for immunization: Secondary | ICD-10-CM

## 2018-03-19 DIAGNOSIS — Z79899 Other long term (current) drug therapy: Secondary | ICD-10-CM | POA: Diagnosis not present

## 2018-03-19 MED ORDER — EMTRICITABINE-TENOFOVIR DF 200-300 MG PO TABS: 1.0000 | ORAL_TABLET | Freq: Every day | ORAL | 3 refills | Status: DC

## 2018-03-19 NOTE — Addendum Note (Signed)
Addended by: Aundria Rud on: 03/19/2018 04:33 PM   Modules accepted: Orders

## 2018-03-19 NOTE — Patient Instructions (Signed)
Nice to see you.  We will check your lab work today.  Continue to take your Truvada as prescribed.  Plan for office follow up in 3 months or sooner if needed. Lab work can be completed 1-2 weeks prior to your appointment.  Have a Happy New Year and safe travels to [place]!

## 2018-03-19 NOTE — Progress Notes (Signed)
Subjective:    Patient ID: James Barry, male    DOB: [birth year], 63 y.o.   MRN: [id number]  Chief Complaint  Patient presents with  . PrEP     HPI:  James Barry is a 63 y.o. male who presents today for routine PrEP follow up.  James Barry was last seen in the office on 12/17/17 for routine PrEP management with good adherence and tolerance to Truvada. Blood work with negative HIV and STI screening. Kidney function, liver function and electrolytes were within normal ranges.  James Barry has been taking his Truvada as prescribed with no adverse side effects or missed doses. He remains sexually active at the present time. Not currently experiencing symptoms of STI.   Allergies  Allergen Reactions  . Buspirone   . Cephalexin       Outpatient Medications Prior to Visit  Medication Sig Dispense Refill  . acetaminophen (TYLENOL) 650 MG CR tablet Take 650 mg by mouth every 8 (eight) hours as needed for pain.    . chlorpheniramine-HYDROcodone (TUSSIONEX PENNKINETIC ER) 10-8 MG/5ML SUER Take 5 mLs by mouth every 12 (twelve) hours as needed for cough. 50 mL 0  . esomeprazole (NEXIUM) 40 MG capsule TAKE 1 CAPSULE BY MOUTH EVERY DAY 90 capsule 1  . FIBER PO Take by mouth.    . Multiple Vitamin (MULTIVITAMIN) capsule Take 1 capsule by mouth daily.    . sildenafil (VIAGRA) 100 MG tablet TAKE 1 TABLET BY MOUTH ONCE DAILY FOR ERECTILE DYSFUNCTION 90 tablet 0  . emtricitabine-tenofovir AF (DESCOVY) 200-25 MG tablet Take 1 tablet by mouth daily. 30 tablet 3  . azithromycin (ZITHROMAX) 250 MG tablet Take 2 tablets by mouth today, then 1 tablet daily for 4 additional days. (Patient not taking: Reported on 03/19/2018) 6 tablet 0   No facility-administered medications prior to visit.      Past Medical History:  Diagnosis Date  . Diverticulosis   . Erectile dysfunction   . GERD (gastroesophageal reflux disease)   . Hyperlipidemia   . Vertigo      Past Surgical History:  Procedure Laterality  Date  . COLONOSCOPY  2013   cleared for 5 yrs- Dr @ [facility] Narda [facility])    Review of Systems  Constitutional: Negative for appetite change, chills, fatigue, fever and unexpected weight change.  Eyes: Negative for visual disturbance.  Respiratory: Negative for cough, chest tightness, shortness of breath and wheezing.   Cardiovascular: Negative for chest pain and leg swelling.  Gastrointestinal: Negative for abdominal pain, constipation, diarrhea, nausea and vomiting.  Genitourinary: Negative for dysuria, flank pain, frequency, genital sores, hematuria and urgency.  Skin: Negative for rash.  Allergic/Immunologic: Negative for immunocompromised state.  Neurological: Negative for dizziness and headaches.      Objective:    BP 134/88   Pulse 74   Temp 97.7 F (36.5 C) (Oral)   Wt 156 lb 1.9 oz (70.8 kg)   BMI 24.45 kg/m  Nursing note and vital signs reviewed.  Physical Exam Constitutional:      General: He is not in acute distress.    Appearance: He is well-developed.  Eyes:     Conjunctiva/sclera: Conjunctivae normal.  Neck:     Musculoskeletal: Neck supple.  Cardiovascular:     Rate and Rhythm: Normal rate and regular rhythm.     Heart sounds: Normal heart sounds. No murmur. No friction rub. No gallop.   Pulmonary:     Effort: Pulmonary effort is normal. No respiratory distress.  Breath sounds: Normal breath sounds. No wheezing or rales.  Chest:     Chest wall: No tenderness.  Abdominal:     General: Bowel sounds are normal.     Palpations: Abdomen is soft.     Tenderness: There is no abdominal tenderness.  Lymphadenopathy:     Cervical: No cervical adenopathy.  Skin:    General: Skin is warm and dry.     Findings: No rash.  Neurological:     Mental Status: He is alert and oriented to person, place, and time.  Psychiatric:        Behavior: Behavior normal.        Thought Content: Thought content normal.        Judgment: Judgment normal.          Assessment & Plan:   Problem List Items Addressed This Visit      Other   Routine screening for STI (sexually transmitted infection)   Relevant Orders   RPR   Comprehensive metabolic panel   Urine cytology ancillary only([place])   Cytology (oral, anal, urethral) ancillary only   HIV antibody (with reflex)   HIV antibody (with reflex)   Comprehensive metabolic panel   RPR   Urine cytology ancillary only([place])   Urine cytology ancillary only([place])   On pre-exposure prophylaxis for HIV - Primary    James Barry appears to be doing well with his Truvada with good adherence and tolerance. Remains sexually active and encouraged condom use to prevent acquisition and transmission of HIV and STI. Will screen today. Continue current dose of Truvada. Plan for follow up office visit in 3 months or sooner if needed.       Relevant Medications   emtricitabine-tenofovir (TRUVADA) 200-300 MG tablet   Other Relevant Orders   RPR   Comprehensive metabolic panel   Urine cytology ancillary only([place])   Cytology (oral, anal, urethral) ancillary only   HIV antibody (with reflex)   HIV antibody (with reflex)   Comprehensive metabolic panel   RPR   Urine cytology ancillary only([place])   Urine cytology ancillary only()       I have discontinued James Barry. James Barry emtricitabine-tenofovir AF and azithromycin. I am also having him start on emtricitabine-tenofovir. Additionally, I am having him maintain his multivitamin, FIBER PO, acetaminophen, esomeprazole, sildenafil, and chlorpheniramine-HYDROcodone.   Meds ordered this encounter  Medications  . emtricitabine-tenofovir (TRUVADA) 200-300 MG tablet    Sig: Take 1 tablet by mouth daily.    Dispense:  30 tablet    Refill:  3    Order Specific Question:   Supervising Provider    Answer:   Carlyle Basques [[id number]]     Follow-up: Return in about 3 months (around 06/18/2018), or if symptoms worsen or fail to  improve.   Terri Piedra, MSN, FNP-C Nurse Practitioner [facility] for [facility] Office phone: [phone] Pager: [place] number: [phone]

## 2018-03-19 NOTE — Assessment & Plan Note (Signed)
James Barry appears to be doing well with his Truvada with good adherence and tolerance. Remains sexually active and encouraged condom use to prevent acquisition and transmission of HIV and STI. Will screen today. Continue current dose of Truvada. Plan for follow up office visit in 3 months or sooner if needed.

## 2018-03-20 ENCOUNTER — Other Ambulatory Visit: Payer: Self-pay | Admitting: Pharmacist

## 2018-03-20 DIAGNOSIS — Z79899 Other long term (current) drug therapy: Secondary | ICD-10-CM

## 2018-03-20 LAB — COMPREHENSIVE METABOLIC PANEL
AG RATIO: 1.7 (calc) (ref 1.0–2.5)
ALBUMIN MSPROF: 4.5 g/dL (ref 3.6–5.1)
ALKALINE PHOSPHATASE (APISO): 76 U/L (ref 40–115)
ALT: 21 U/L (ref 9–46)
AST: 22 U/L (ref 10–35)
BUN: 12 mg/dL (ref 7–25)
CHLORIDE: 103 mmol/L (ref 98–110)
CO2: 26 mmol/L (ref 20–32)
Calcium: 9.7 mg/dL (ref 8.6–10.3)
Creat: 0.91 mg/dL (ref 0.70–1.25)
GLOBULIN: 2.7 g/dL (ref 1.9–3.7)
GLUCOSE: 63 mg/dL — AB (ref 65–99)
POTASSIUM: 4.4 mmol/L (ref 3.5–5.3)
Sodium: 138 mmol/L (ref 135–146)
Total Bilirubin: 0.3 mg/dL (ref 0.2–1.2)
Total Protein: 7.2 g/dL (ref 6.1–8.1)

## 2018-03-20 LAB — RPR: RPR Ser Ql: NONREACTIVE

## 2018-03-20 LAB — HIV ANTIBODY (ROUTINE TESTING W REFLEX): HIV 1&2 Ab, 4th Generation: NONREACTIVE

## 2018-03-20 MED ORDER — EMTRICITABINE-TENOFOVIR DF 200-300 MG PO TABS: 1.0000 | ORAL_TABLET | Freq: Every day | ORAL | 3 refills | Status: DC

## 2018-03-23 DIAGNOSIS — L7211 Pilar cyst: Secondary | ICD-10-CM | POA: Diagnosis not present

## 2018-03-23 DIAGNOSIS — L720 Epidermal cyst: Secondary | ICD-10-CM | POA: Diagnosis not present

## 2018-03-23 LAB — CYTOLOGY, (ORAL, ANAL, URETHRAL) ANCILLARY ONLY
Chlamydia: NEGATIVE
Neisseria Gonorrhea: NEGATIVE

## 2018-03-23 LAB — URINE CYTOLOGY ANCILLARY ONLY
Chlamydia: NEGATIVE
Neisseria Gonorrhea: NEGATIVE

## 2018-03-24 MED FILL — TRUVADA 200-300 MG TABS: 200-300 | 30 days supply | Qty: 30 | Fill #0

## 2018-03-31 ENCOUNTER — Other Ambulatory Visit: Payer: Self-pay | Admitting: Primary Care

## 2018-03-31 DIAGNOSIS — K219 Gastro-esophageal reflux disease without esophagitis: Secondary | ICD-10-CM

## 2018-03-31 MED FILL — ESOMEPRAZOLE MAG DR 40 MG C: 40 | 90 days supply | Qty: 90 | Fill #0

## 2018-04-06 DIAGNOSIS — L720 Epidermal cyst: Secondary | ICD-10-CM | POA: Diagnosis not present

## 2018-04-06 DIAGNOSIS — L7211 Pilar cyst: Secondary | ICD-10-CM | POA: Diagnosis not present

## 2018-04-24 MED FILL — TRUVADA 200-300 MG TABS: 200-300 | 30 days supply | Qty: 30 | Fill #1

## 2018-04-29 DIAGNOSIS — H5211 Myopia, right eye: Secondary | ICD-10-CM | POA: Diagnosis not present

## 2018-04-29 DIAGNOSIS — H524 Presbyopia: Secondary | ICD-10-CM | POA: Diagnosis not present

## 2018-04-29 DIAGNOSIS — H5202 Hypermetropia, left eye: Secondary | ICD-10-CM | POA: Diagnosis not present

## 2018-04-29 DIAGNOSIS — H52223 Regular astigmatism, bilateral: Secondary | ICD-10-CM | POA: Diagnosis not present

## 2018-04-29 MED FILL — OLOPATADINE HCL 0.2% EYE DR: 0.2 | 25 days supply | Qty: 3 | Fill #0

## 2018-05-07 ENCOUNTER — Other Ambulatory Visit: Payer: Self-pay | Admitting: Primary Care

## 2018-05-07 DIAGNOSIS — N529 Male erectile dysfunction, unspecified: Secondary | ICD-10-CM

## 2018-05-07 NOTE — Telephone Encounter (Signed)
Last prescribed on 01/13/2018 . Last office visit on 01/26/2018. Next future appointment on 08/17/2018

## 2018-05-07 NOTE — Telephone Encounter (Signed)
Noted, refill sent to pharmacy. 

## 2018-05-12 ENCOUNTER — Telehealth: Payer: Self-pay | Admitting: Primary Care

## 2018-05-12 NOTE — Telephone Encounter (Signed)
Left message asking pt to call office please r/s 6/1 appointment with [facility]

## 2018-05-25 MED FILL — SILDENAFIL CITRATE 100 MG T: 100 | 30 days supply | Qty: 6 | Fill #0

## 2018-05-25 MED FILL — OLOPATADINE HCL 0.2% EYE DR: 0.2 | 25 days supply | Qty: 3 | Fill #1

## 2018-05-25 MED FILL — TRUVADA 200-300 MG TABS: 200-300 | 30 days supply | Qty: 30 | Fill #2

## 2018-06-16 MED FILL — TRUVADA 200-300 MG TABS: 200-300 | 30 days supply | Qty: 30 | Fill #3

## 2018-06-18 ENCOUNTER — Other Ambulatory Visit: Payer: 59

## 2018-06-18 ENCOUNTER — Other Ambulatory Visit: Payer: Self-pay

## 2018-06-18 ENCOUNTER — Other Ambulatory Visit (HOSPITAL_COMMUNITY)
Admission: RE | Admit: 2018-06-18 | Discharge: 2018-06-18 | Disposition: A | Discharge status: Home / Self Care | Payer: 59 | Source: Ambulatory Visit | Attending: Family | Admitting: Family

## 2018-06-18 DIAGNOSIS — Z113 Encounter for screening for infections with a predominantly sexual mode of transmission: Secondary | ICD-10-CM | POA: Insufficient documentation

## 2018-06-18 DIAGNOSIS — Z79899 Other long term (current) drug therapy: Secondary | ICD-10-CM | POA: Diagnosis not present

## 2018-06-19 LAB — COMPREHENSIVE METABOLIC PANEL
AG Ratio: 1.7 (calc) (ref 1.0–2.5)
ALT: 18 U/L (ref 9–46)
AST: 20 U/L (ref 10–35)
Albumin: 4.7 g/dL (ref 3.6–5.1)
Alkaline phosphatase (APISO): 72 U/L (ref 35–144)
BUN: 15 mg/dL (ref 7–25)
CO2: 27 mmol/L (ref 20–32)
Calcium: 9.8 mg/dL (ref 8.6–10.3)
Chloride: 103 mmol/L (ref 98–110)
Creat: 0.94 mg/dL (ref 0.70–1.25)
Globulin: 2.8 g/dL (calc) (ref 1.9–3.7)
Glucose, Bld: 77 mg/dL (ref 65–99)
Potassium: 3.9 mmol/L (ref 3.5–5.3)
Sodium: 140 mmol/L (ref 135–146)
Total Bilirubin: 0.3 mg/dL (ref 0.2–1.2)
Total Protein: 7.5 g/dL (ref 6.1–8.1)

## 2018-06-19 LAB — RPR: RPR Ser Ql: NONREACTIVE

## 2018-06-19 LAB — HIV ANTIBODY (ROUTINE TESTING W REFLEX): HIV 1&2 Ab, 4th Generation: NONREACTIVE

## 2018-06-22 LAB — URINE CYTOLOGY ANCILLARY ONLY
Chlamydia: NEGATIVE
Neisseria Gonorrhea: NEGATIVE

## 2018-06-29 MED FILL — ESOMEPRAZOLE MAG DR 40 MG C: 40 | 90 days supply | Qty: 90 | Fill #1

## 2018-07-06 ENCOUNTER — Other Ambulatory Visit: Payer: Self-pay

## 2018-07-06 ENCOUNTER — Ambulatory Visit (INDEPENDENT_AMBULATORY_CARE_PROVIDER_SITE_OTHER): Payer: 59 | Admitting: Family

## 2018-07-06 ENCOUNTER — Encounter: Payer: Self-pay | Admitting: Family

## 2018-07-06 DIAGNOSIS — Z79899 Other long term (current) drug therapy: Secondary | ICD-10-CM | POA: Diagnosis not present

## 2018-07-06 DIAGNOSIS — Z7252 High risk homosexual behavior: Secondary | ICD-10-CM | POA: Diagnosis not present

## 2018-07-06 MED ORDER — EMTRICITABINE-TENOFOVIR DF 200-300 MG PO TABS: 1.0000 | ORAL_TABLET | Freq: Every day | ORAL | 3 refills | Status: DC

## 2018-07-06 MED FILL — TRUVADA 200-300 MG TABS: 200-300 | 30 days supply | Qty: 30 | Fill #0

## 2018-07-06 NOTE — Patient Instructions (Signed)
Nice to speak with you.  Please continue to take your Truvada as prescribed daily.  We will plan to see you in 3 months or sooner if needed with lab work at your office visit.   Have a great day and stay safe!

## 2018-07-06 NOTE — Progress Notes (Signed)
Subjective:    Patient ID: James Barry, male    DOB: 10-Oct-1955, 63 y.o.   MRN: [id number]  Chief Complaint  Patient presents with  . PrEP     Virtual Visit via Telephone Note   I connected with Mr. Blanton Kardell on 07/06/2018 at 4:15 pm by telephone and verified that I am speaking with the correct person using two identifiers.   I discussed the limitations, risks, security and privacy concerns of performing an evaluation and management service by telephone and the availability of in person appointments. I also discussed with the patient that there may be a patient responsible charge related to this service. The patient expressed understanding and agreed to proceed.   HPI:  James Barry is a 63 y.o. male who was last seen in the office 1//2/20 for routine follow up PrEP with good adherence and tolerance to his Truvada. Most recent blood work completed 06/18/18 with negative testing for HIV, gonorrhea, and chlamydia. Renal function, hepatic function, and electrolytes are within the normal ranges.  Mr. Haselton continues to take his Truvada as prescribed with no adverse side effects of missed doses. He does remain sexually active at the present time and in a relationship. He traveled back in February to [place] for his 4 year anniversary with his partner.  Denies fevers, chills, night sweats, headaches, changes in vision, neck pain/stiffness, nausea, diarrhea, vomiting, lesions or rashes.   Allergies  Allergen Reactions  . Buspirone   . Cephalexin       Outpatient Medications Prior to Visit  Medication Sig Dispense Refill  . acetaminophen (TYLENOL) 650 MG CR tablet Take 650 mg by mouth every 8 (eight) hours as needed for pain.    . chlorpheniramine-HYDROcodone (TUSSIONEX PENNKINETIC ER) 10-8 MG/5ML SUER Take 5 mLs by mouth every 12 (twelve) hours as needed for cough. 50 mL 0  . esomeprazole (NEXIUM) 40 MG capsule TAKE 1 CAPSULE BY MOUTH EVERY DAY 90 capsule 1  . FIBER PO Take by  mouth.    . Multiple Vitamin (MULTIVITAMIN) capsule Take 1 capsule by mouth daily.    . sildenafil (VIAGRA) 100 MG tablet TAKE 1 TABLET BY MOUTH ONCE DAILY FOR ERECTILE DYSFUNCTION 90 tablet 0  . emtricitabine-tenofovir (TRUVADA) 200-300 MG tablet Take 1 tablet by mouth daily. 30 tablet 3   No facility-administered medications prior to visit.      Past Medical History:  Diagnosis Date  . Diverticulosis   . Erectile dysfunction   . GERD (gastroesophageal reflux disease)   . Hyperlipidemia   . Vertigo      Past Surgical History:  Procedure Laterality Date  . COLONOSCOPY  2013   cleared for 5 yrs- Dr @ [facility] N[facility])    Review of Systems  Constitutional: Negative for appetite change, chills, fatigue, fever and unexpected weight change.  Eyes: Negative for visual disturbance.  Respiratory: Negative for cough, chest tightness, shortness of breath and wheezing.   Cardiovascular: Negative for chest pain and leg swelling.  Gastrointestinal: Negative for abdominal pain, constipation, diarrhea, nausea and vomiting.  Genitourinary: Negative for dysuria, flank pain, frequency, genital sores, hematuria and urgency.  Skin: Negative for rash.  Allergic/Immunologic: Negative for immunocompromised state.  Neurological: Negative for dizziness and headaches.      Objective:    Nursing note and vital signs reviewed.    Mr. Emig is pleasant to speak with on the phone and sounds to be doing well. Assessment & Plan:   Problem List Items  Addressed This Visit      Other   On pre-exposure prophylaxis for HIV - Primary    Mr. Barnhard continues to do well on PrEP with good adherence and tolerance. STI and HIV testing is negative. Discussed importance of safe sexual practice to reduce risk of transmission/acquisition of STI. Continue current dose of Truvada. Will plan to follow up in 2 months or sooner if needed with lab work same day.       Relevant Medications    emtricitabine-tenofovir (TRUVADA) 200-300 MG tablet    Other Visit Diagnoses    High risk homosexual behavior           I am having [name] maintain his multivitamin, FIBER PO, acetaminophen, chlorpheniramine-HYDROcodone, esomeprazole, sildenafil, and emtricitabine-tenofovir.   Meds ordered this encounter  Medications  . emtricitabine-tenofovir (TRUVADA) 200-300 MG tablet    Sig: Take 1 tablet by mouth daily.    Dispense:  30 tablet    Refill:  3    Order Specific Question:   Supervising Provider    Answer:   Carlyle Basques [phone]     I discussed the assessment and treatment plan with the patient. The patient was provided an opportunity to ask questions and all were answered. The patient agreed with the plan and demonstrated an understanding of the instructions.   The patient was advised to call back or seek an in-person evaluation if the symptoms worsen or if the condition fails to improve as anticipated.   I provided  12 minutes of non-face-to-face time during this encounter.  Follow-up: Return in about 3 months (around 10/05/2018), or if symptoms worsen or fail to improve.   Terri Piedra, MSN, FNP-C Nurse Practitioner [facility] for Infectious Disease [place] number: [phone]

## 2018-07-06 NOTE — Assessment & Plan Note (Signed)
James Barry continues to do well on PrEP with good adherence and tolerance. STI and HIV testing is negative. Discussed importance of safe sexual practice to reduce risk of transmission/acquisition of STI. Continue current dose of Truvada. Will plan to follow up in 2 months or sooner if needed with lab work same day.

## 2018-07-14 DIAGNOSIS — R898 Other abnormal findings in specimens from other organs, systems and tissues: Secondary | ICD-10-CM | POA: Diagnosis not present

## 2018-08-13 ENCOUNTER — Other Ambulatory Visit: Payer: Self-pay | Admitting: Primary Care

## 2018-08-13 DIAGNOSIS — I10 Essential (primary) hypertension: Secondary | ICD-10-CM

## 2018-08-13 DIAGNOSIS — Z125 Encounter for screening for malignant neoplasm of prostate: Secondary | ICD-10-CM

## 2018-08-14 ENCOUNTER — Other Ambulatory Visit (INDEPENDENT_AMBULATORY_CARE_PROVIDER_SITE_OTHER): Payer: 59

## 2018-08-14 DIAGNOSIS — Z125 Encounter for screening for malignant neoplasm of prostate: Secondary | ICD-10-CM

## 2018-08-14 DIAGNOSIS — I10 Essential (primary) hypertension: Secondary | ICD-10-CM | POA: Diagnosis not present

## 2018-08-14 LAB — LIPID PANEL
Cholesterol: 163 mg/dL (ref 0–200)
HDL: 38.7 mg/dL — ABNORMAL LOW (ref >39.00)
LDL Cholesterol: 92 mg/dL (ref 0–99)
NonHDL: 124.49
Total CHOL/HDL Ratio: 4
Triglycerides: 162 mg/dL — ABNORMAL HIGH (ref 0.0–149.0)
VLDL: 32.4 mg/dL (ref 0.0–40.0)

## 2018-08-14 LAB — COMPREHENSIVE METABOLIC PANEL
ALT: 21 U/L (ref 0–53)
AST: 21 U/L (ref 0–37)
Albumin: 4.4 g/dL (ref 3.5–5.2)
Alkaline Phosphatase: 74 U/L (ref 39–117)
BUN: 13 mg/dL (ref 6–23)
CO2: 29 mEq/L (ref 19–32)
Calcium: 9.5 mg/dL (ref 8.4–10.5)
Chloride: 100 mEq/L (ref 96–112)
Creatinine, Ser: 1 mg/dL (ref 0.40–1.50)
GFR: 75.5 mL/min (ref >60.00)
Glucose, Bld: 91 mg/dL (ref 70–99)
Potassium: 4.4 mEq/L (ref 3.5–5.1)
Sodium: 137 mEq/L (ref 135–145)
Total Bilirubin: 0.4 mg/dL (ref 0.2–1.2)
Total Protein: 7.3 g/dL (ref 6.0–8.3)

## 2018-08-14 LAB — PSA: PSA: 2.18 ng/mL (ref 0.10–4.00)

## 2018-08-17 ENCOUNTER — Encounter: Payer: 59 | Admitting: Primary Care

## 2018-08-19 MED FILL — TRUVADA 200-300 MG TABS: 200-300 | 30 days supply | Qty: 30 | Fill #1

## 2018-08-21 MED FILL — SILDENAFIL CITRATE 100 MG T: 100 | 30 days supply | Qty: 6 | Fill #1

## 2018-08-24 ENCOUNTER — Other Ambulatory Visit: Payer: Self-pay

## 2018-08-24 ENCOUNTER — Ambulatory Visit (INDEPENDENT_AMBULATORY_CARE_PROVIDER_SITE_OTHER): Payer: 59 | Admitting: Primary Care

## 2018-08-24 ENCOUNTER — Encounter: Payer: Self-pay | Admitting: Primary Care

## 2018-08-24 VITALS — BP 128/80 | HR 77 | Temp 97.7°F | Ht 67.0 in | Wt 158.0 lb

## 2018-08-24 DIAGNOSIS — Z79899 Other long term (current) drug therapy: Secondary | ICD-10-CM | POA: Diagnosis not present

## 2018-08-24 DIAGNOSIS — Z0000 Encounter for general adult medical examination without abnormal findings: Secondary | ICD-10-CM | POA: Insufficient documentation

## 2018-08-24 DIAGNOSIS — R945 Abnormal results of liver function studies: Secondary | ICD-10-CM | POA: Diagnosis not present

## 2018-08-24 DIAGNOSIS — K219 Gastro-esophageal reflux disease without esophagitis: Secondary | ICD-10-CM

## 2018-08-24 DIAGNOSIS — E7849 Other hyperlipidemia: Secondary | ICD-10-CM

## 2018-08-24 DIAGNOSIS — I10 Essential (primary) hypertension: Secondary | ICD-10-CM

## 2018-08-24 DIAGNOSIS — R7989 Other specified abnormal findings of blood chemistry: Secondary | ICD-10-CM

## 2018-08-24 DIAGNOSIS — N529 Male erectile dysfunction, unspecified: Secondary | ICD-10-CM | POA: Diagnosis not present

## 2018-08-24 NOTE — Assessment & Plan Note (Signed)
Doing well on Nexium daily, using Pepto Bismol or Tums as needed for nightly reflux. Continue same.

## 2018-08-24 NOTE — Assessment & Plan Note (Signed)
Doing well on sildenafil. Continue same.

## 2018-08-24 NOTE — Assessment & Plan Note (Signed)
Unremarkable on recent labs.

## 2018-08-24 NOTE — Assessment & Plan Note (Signed)
Recent lipids stable, continue to monitor.  Encouraged healthy diet and regular exercise.

## 2018-08-24 NOTE — Assessment & Plan Note (Signed)
Stable in the office today, not on medication.

## 2018-08-24 NOTE — Assessment & Plan Note (Signed)
Immunizations UTD. PSA normal. Encouraged regular exercise, healthy diet. Exam unremarkable. Labs reviewed. Follow up in 1 year for CPE.

## 2018-08-24 NOTE — Assessment & Plan Note (Signed)
Doing well on Truvada. Continue current regimen. Following with infectious disease.

## 2018-08-24 NOTE — Progress Notes (Signed)
Subjective:    Patient ID: James Barry, male    DOB: May 17, 1955, 63 y.o.   MRN: [id number]  HPI  James Barry is a 63 year old male who presents today for complete physical.  Immunizations: -Tetanus: Completed in 2011 -Influenza: Due this season  -Shingles: Completed Shingrix  BP Readings from Last 3 Encounters:  08/24/18 128/80  03/19/18 134/88  01/26/18 120/82     Diet: He endorses a fair diet.   Eggs, cereal, bacon. Sandwich, chips Take out food, meat/vegetable/fruit Cookies, Ice Cream Orange juice, water, wine  Exercise: He is walking most every day of the week. Currently not at the gym as they are closed. Eye exam: Completes annually, Dr. Samantha Crimes Dental exam: Completes semi-annually Colonoscopy: Completed in 2019 PSA: 2.18 in 2020 Hep C Screen: Negative    Review of Systems  Constitutional: Negative for unexpected weight change.  HENT: Negative for rhinorrhea.   Respiratory: Negative for cough and shortness of breath.   Cardiovascular: Negative for chest pain.  Gastrointestinal: Negative for constipation and diarrhea.  Genitourinary: Negative for difficulty urinating.  Musculoskeletal: Negative for arthralgias and myalgias.  Skin: Negative for rash.  Allergic/Immunologic: Negative for environmental allergies.  Neurological: Negative for dizziness, numbness and headaches.  Psychiatric/Behavioral:       Intermittent anxiety, overall able to manage well on his own.       Past Medical History:  Diagnosis Date  . Diverticulosis   . Erectile dysfunction   . GERD (gastroesophageal reflux disease)   . Hyperlipidemia   . Vertigo      Social History   Socioeconomic History  . Marital status: Married    Spouse name: Not on file  . Number of children: Not on file  . Years of education: Not on file  . Highest education level: Not on file  Occupational History  . Not on file  Social Needs  . Financial resource strain: Not on file  . Food  insecurity:    Worry: Not on file    Inability: Not on file  . Transportation needs:    Medical: Not on file    Non-medical: Not on file  Tobacco Use  . Smoking status: Never Smoker  . Smokeless tobacco: Never Used  Substance and Sexual Activity  . Alcohol use: Yes    Alcohol/week: 0.0 standard drinks    Comment: social  . Drug use: No  . Sexual activity: Yes  Lifestyle  . Physical activity:    Days per week: Not on file    Minutes per session: Not on file  . Stress: Not on file  Relationships  . Social connections:    Talks on phone: Not on file    Gets together: Not on file    Attends religious service: Not on file    Active member of club or organization: Not on file    Attends meetings of clubs or organizations: Not on file    Relationship status: Not on file  . Intimate partner violence:    Fear of current or ex partner: Not on file    Emotionally abused: Not on file    Physically abused: Not on file    Forced sexual activity: Not on file  Other Topics Concern  . Not on file  Social History Narrative   Married.   No children.   Retired.   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    Past Surgical History:  Procedure  Laterality Date  . COLONOSCOPY  2013   cleared for 5 yrs- Dr @ [facility])    Family History  Problem Relation Age of Onset  . Breast cancer Mother   . Lung cancer Father   . Heart disease Father   . Hypertension Father   . Arthritis Maternal Grandmother     Allergies  Allergen Reactions  . Buspirone   . Cephalexin     Current Outpatient Medications on File Prior to Visit  Medication Sig Dispense Refill  . acetaminophen (TYLENOL) 650 MG CR tablet Take 650 mg by mouth every 8 (eight) hours as needed for pain.    Marland Kitchen emtricitabine-tenofovir (TRUVADA) 200-300 MG tablet Take 1 tablet by mouth daily. 30 tablet 3  . esomeprazole (NEXIUM) 40 MG capsule TAKE 1 CAPSULE BY MOUTH EVERY DAY 90  capsule 1  . FIBER PO Take by mouth.    . Multiple Vitamin (MULTIVITAMIN) capsule Take 1 capsule by mouth daily.    . sildenafil (VIAGRA) 100 MG tablet TAKE 1 TABLET BY MOUTH ONCE DAILY FOR ERECTILE DYSFUNCTION 90 tablet 0   No current facility-administered medications on file prior to visit.     BP 128/80   Pulse 77   Temp 97.7 F (36.5 C) (Tympanic)   Ht 5\' 7"  (1.702 m)   Wt 158 lb (71.7 kg)   SpO2 98%   BMI 24.75 kg/m    Objective:   Physical Exam  Constitutional: He is oriented to person, place, and time. He appears well-nourished.  HENT:  Mouth/Throat: No oropharyngeal exudate.  Eyes: Pupils are equal, round, and reactive to light. EOM are normal.  Neck: Neck supple. No thyromegaly present.  Cardiovascular: Normal rate and regular rhythm.  Respiratory: Effort normal and breath sounds normal.  GI: Soft. Bowel sounds are normal. There is no abdominal tenderness.  Musculoskeletal: Normal range of motion.  Neurological: He is alert and oriented to person, place, and time.  Skin: Skin is warm and dry.  Psychiatric: He has a normal mood and affect.           Assessment & Plan:

## 2018-08-24 NOTE — Patient Instructions (Signed)
Continue exercising. You should be getting 150 minutes of moderate intensity exercise weekly.  Continue to work on [employer]. Limit junk food and cookies :)  Ensure you are consuming 64 ounces of water daily.  Please notify me if you need anything! It was a pleasure to see you today!   Preventive Care 40-64 Years, Male Preventive care refers to lifestyle choices and visits with your health care provider that can promote health and wellness. What does preventive care include?   A yearly physical exam. This is also called an annual well check.  Dental exams once or twice a year.  Routine eye exams. Ask your health care provider how often you should have your eyes checked.  Personal lifestyle choices, including: ? Daily care of your teeth and gums. ? Regular physical activity. ? Eating a healthy diet. ? Avoiding tobacco and drug use. ? Limiting alcohol use. ? Practicing safe sex. ? Taking low-dose aspirin every day starting at age 40. What happens during an annual well check? The services and screenings done by your health care provider during your annual well check will depend on your age, overall health, lifestyle risk factors, and family history of disease. Counseling Your health care provider may ask you questions about your:  Alcohol use.  Tobacco use.  Drug use.  Emotional well-being.  Home and relationship well-being.  Sexual activity.  Eating habits.  Work and work Statistician. Screening You may have the following tests or measurements:  Height, weight, and BMI.  Blood pressure.  Lipid and cholesterol levels. These may be checked every 5 years, or more frequently if you are over 73 years old.  Skin check.  Lung cancer screening. You may have this screening every year starting at age 50 if you have a 30-pack-year history of smoking and currently smoke or have quit within the past 15 years.  Colorectal cancer screening. All adults should have this  screening starting at age 23 and continuing until age 66. Your health care provider may recommend screening at age 72. You will have tests every 1-10 years, depending on your results and the type of screening test. People at increased risk should start screening at an earlier age. Screening tests may include: ? Guaiac-based fecal occult blood testing. ? Fecal immunochemical test (FIT). ? Stool DNA test. ? Virtual colonoscopy. ? Sigmoidoscopy. During this test, a flexible tube with a tiny camera (sigmoidoscope) is used to examine your rectum and lower colon. The sigmoidoscope is inserted through your anus into your rectum and lower colon. ? Colonoscopy. During this test, a long, thin, flexible tube with a tiny camera (colonoscope) is used to examine your entire colon and rectum.  Prostate cancer screening. Recommendations will vary depending on your family history and other risks.  Hepatitis C blood test.  Hepatitis B blood test.  Sexually transmitted disease (STD) testing.  Diabetes screening. This is done by checking your blood sugar (glucose) after you have not eaten for a while (fasting). You may have this done every 1-3 years. Discuss your test results, treatment options, and if necessary, the need for more tests with your health care provider. Vaccines Your health care provider may recommend certain vaccines, such as:  Influenza vaccine. This is recommended every year.  Tetanus, diphtheria, and acellular pertussis (Tdap, Td) vaccine. You may need a Td booster every 10 years.  Varicella vaccine. You may need this if you have not been vaccinated.  Zoster vaccine. You may need this after age 52.  Measles,  mumps, and rubella (MMR) vaccine. You may need at least one dose of MMR if you were born in 1957 or later. You may also need a second dose.  Pneumococcal 13-valent conjugate (PCV13) vaccine. You may need this if you have certain conditions and have not been vaccinated.   Pneumococcal polysaccharide (PPSV23) vaccine. You may need one or two doses if you smoke cigarettes or if you have certain conditions.  Meningococcal vaccine. You may need this if you have certain conditions.  Hepatitis A vaccine. You may need this if you have certain conditions or if you travel or work in places where you may be exposed to hepatitis A.  Hepatitis B vaccine. You may need this if you have certain conditions or if you travel or work in places where you may be exposed to hepatitis B.  Haemophilus influenzae type b (Hib) vaccine. You may need this if you have certain risk factors. Talk to your health care provider about which screenings and vaccines you need and how often you need them. This information is not intended to replace advice given to you by your health care provider. Make sure you discuss any questions you have with your health care provider. Document Released: 03/31/2015 Document Revised: 04/24/2017 Document Reviewed: 01/03/2015 Elsevier Interactive Patient Education  2019 [employer].

## 2018-09-14 ENCOUNTER — Ambulatory Visit: Payer: Self-pay | Admitting: *Deleted

## 2018-09-14 NOTE — Telephone Encounter (Signed)
I spoke with pt; pt sent mychart message to [facility] and has appt to see Anda Kraft on 09/21/18 at 8:20. Pt thinks may need urology referral ? Slight swelling in testicle on lt testicle with slight pain. Testicle is no longer hard to touch; now is soft to touch. Offered pt a sooner appt but pt said first time he could come in is 09/21/18. ED precautions given and pt voiced understanding. FYI to Gentry Fitz NP.

## 2018-09-14 NOTE — Telephone Encounter (Signed)
Per DPR, left detail message of Kate Clark's comments for patient to call back 

## 2018-09-14 NOTE — Telephone Encounter (Addendum)
Robin at [facility] attempted to transfer this call for Nurse Triage. Attempted these numbers for contact with patient. Left VM to return call if needs to speak with a nurse.  Routing back to provider as this patient has not called back to NT.

## 2018-09-14 NOTE — Telephone Encounter (Signed)
Do they want to do a virtual visit with me this week? If they prefer to see urology then virtual with me this week would be fine and then I could refer to Urology pending our discussion.

## 2018-09-15 NOTE — Telephone Encounter (Signed)
Per DPR, left detail message of Kate Clark's comments for patient to call back 

## 2018-09-15 NOTE — Telephone Encounter (Signed)
Noted. Patient will keep his appointment.

## 2018-09-21 ENCOUNTER — Ambulatory Visit: Payer: 59 | Admitting: Primary Care

## 2018-09-21 ENCOUNTER — Encounter: Payer: Self-pay | Admitting: Primary Care

## 2018-09-21 ENCOUNTER — Other Ambulatory Visit: Payer: Self-pay

## 2018-09-21 DIAGNOSIS — R1030 Lower abdominal pain, unspecified: Secondary | ICD-10-CM

## 2018-09-21 NOTE — Assessment & Plan Note (Addendum)
Acute since lifting 100 pound object about 10 days ago. Overall mild symptoms. Exam today unremarkable. No hernia appreciated on exam.   Suspect MSK strain but will closely monitor. We discussed for him to monitor symptoms and report continued discomfort, any testicular pain/swelling/color changes.   Consider Urology referral vs ultrasound for hernia evaluation. He will update.

## 2018-09-21 NOTE — Progress Notes (Signed)
Subjective:    Patient ID: James Barry, male    DOB: 10-28-1955, 63 y.o.   MRN: [id number]  HPI  Mr. Wirz is a 63 year old male with a history of hypertension, erectile dysfunction, nocturia, inguinal hernia repair bilaterally as a child who presents today with a chief complaint of groin pain.  About 10 days ago he lifted a 100 pound pedestal out of his trunk and walked it up two flights of stairs. The following day he noticed testicular pain with mild swelling and bilateral groin discomfort. His discomfort is overall mild and doesn't really bother him but he does notice the discomfort at times.   He denies bulging to the groin and abdomen, color changes to the scrotum. The scrotal swelling did improve after the incident and he hasn't noticed much since. This morning he was sweeping off the back porch and noticed left groin and testicular discomfort.   Review of Systems  Genitourinary: Negative for discharge, dysuria, frequency and penile pain.       Suprapubic, bilateral groin discomfort, testicular discomfort  Skin: Negative for color change.       Past Medical History:  Diagnosis Date  . Diverticulosis   . Erectile dysfunction   . GERD (gastroesophageal reflux disease)   . Hyperlipidemia   . Vertigo      Social History   Socioeconomic History  . Marital status: Married    Spouse name: Not on file  . Number of children: Not on file  . Years of education: Not on file  . Highest education level: Not on file  Occupational History  . Not on file  Social Needs  . Financial resource strain: Not on file  . Food insecurity    Worry: Not on file    Inability: Not on file  . Transportation needs    Medical: Not on file    Non-medical: Not on file  Tobacco Use  . Smoking status: Never Smoker  . Smokeless tobacco: Never Used  Substance and Sexual Activity  . Alcohol use: Yes    Alcohol/week: 0.0 standard drinks    Comment: social  . Drug use: No  . Sexual activity:  Yes  Lifestyle  . Physical activity    Days per week: Not on file    Minutes per session: Not on file  . Stress: Not on file  Relationships  . Social Herbalist on phone: Not on file    Gets together: Not on file    Attends religious service: Not on file    Active member of club or organization: Not on file    Attends meetings of clubs or organizations: Not on file    Relationship status: Not on file  . Intimate partner violence    Fear of current or ex partner: Not on file    Emotionally abused: Not on file    Physically abused: Not on file    Forced sexual activity: Not on file  Other Topics Concern  . Not on file  Social History Narrative   Married.   No children.   Retired.   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    Past Surgical History:  Procedure Laterality Date  . COLONOSCOPY  2013   cleared for 5 yrs- Dr @ [facility])    Family History  Problem Relation Age of Onset  . Breast cancer Mother   . Lung cancer Father   .  Heart disease Father   . Hypertension Father   . Arthritis Maternal Grandmother     Allergies  Allergen Reactions  . Buspirone   . Cephalexin     Current Outpatient Medications on File Prior to Visit  Medication Sig Dispense Refill  . acetaminophen (TYLENOL) 650 MG CR tablet Take 650 mg by mouth every 8 (eight) hours as needed for pain.    Marland Kitchen emtricitabine-tenofovir (TRUVADA) 200-300 MG tablet Take 1 tablet by mouth daily. 30 tablet 3  . esomeprazole (NEXIUM) 40 MG capsule TAKE 1 CAPSULE BY MOUTH EVERY DAY 90 capsule 1  . FIBER PO Take by mouth.    . Multiple Vitamin (MULTIVITAMIN) capsule Take 1 capsule by mouth daily.    . sildenafil (VIAGRA) 100 MG tablet TAKE 1 TABLET BY MOUTH ONCE DAILY FOR ERECTILE DYSFUNCTION 90 tablet 0   No current facility-administered medications on file prior to visit.     BP 122/80   Pulse 80   Temp 98.2 F (36.8 C) (Temporal)   Ht 5\' 7"   (1.702 m)   Wt 155 lb 8 oz (70.5 kg)   SpO2 98%   BMI 24.35 kg/m    Objective:   Physical Exam  Constitutional: He appears well-nourished.  Respiratory: Effort normal.  GI: Hernia confirmed negative in the right inguinal area and confirmed negative in the left inguinal area.  Genitourinary:    Penis normal.     Right testis shows no mass, no swelling and no tenderness. Left testis shows no mass, no swelling and no tenderness.    Genitourinary Comments: Discomfort in areas marked. No discomfort during exam. No swelling or other abnormality. Negative inguinal hernia exam.   Skin: Skin is warm and dry. No erythema.           Assessment & Plan:

## 2018-09-21 NOTE — Patient Instructions (Signed)
Please notify me if you develop increased pain, your pain persists, scrotal swelling/color changes, abdominal or groin bulging.  Avoid heavy lifting for the next several weeks.  It was a pleasure to see you today!

## 2018-09-23 ENCOUNTER — Other Ambulatory Visit: Payer: Self-pay

## 2018-09-23 ENCOUNTER — Other Ambulatory Visit: Payer: Self-pay | Admitting: Primary Care

## 2018-09-23 ENCOUNTER — Ambulatory Visit (INDEPENDENT_AMBULATORY_CARE_PROVIDER_SITE_OTHER): Payer: 59 | Admitting: Family

## 2018-09-23 ENCOUNTER — Encounter: Payer: Self-pay | Admitting: Family

## 2018-09-23 ENCOUNTER — Other Ambulatory Visit (HOSPITAL_COMMUNITY)
Admission: RE | Admit: 2018-09-23 | Discharge: 2018-09-23 | Disposition: A | Discharge status: Home / Self Care | Payer: 59 | Source: Ambulatory Visit | Attending: Family | Admitting: Family

## 2018-09-23 ENCOUNTER — Other Ambulatory Visit: Payer: Self-pay | Admitting: Pharmacist

## 2018-09-23 VITALS — BP 129/88 | HR 72 | Temp 98.4°F | Wt 154.0 lb

## 2018-09-23 DIAGNOSIS — Z7252 High risk homosexual behavior: Secondary | ICD-10-CM

## 2018-09-23 DIAGNOSIS — Z79899 Other long term (current) drug therapy: Secondary | ICD-10-CM

## 2018-09-23 DIAGNOSIS — Z113 Encounter for screening for infections with a predominantly sexual mode of transmission: Secondary | ICD-10-CM | POA: Diagnosis not present

## 2018-09-23 DIAGNOSIS — K219 Gastro-esophageal reflux disease without esophagitis: Secondary | ICD-10-CM

## 2018-09-23 MED ORDER — EMTRICITABINE-TENOFOVIR DF 200-300 MG PO TABS: 1.0000 | ORAL_TABLET | Freq: Every day | ORAL | 3 refills | Status: DC

## 2018-09-23 MED FILL — ESOMEPRAZOLE MAG DR 40 MG C: 40 | 90 days supply | Qty: 90 | Fill #0

## 2018-09-23 MED FILL — TRUVADA 200-300 MG TABS: 200-300 | 30 days supply | Qty: 30 | Fill #0

## 2018-09-23 NOTE — Patient Instructions (Signed)
Nice to see you.   We will check your lab work today.  Continue to take your Truvada as prescribed.  Plan for follow up in 3 months or sooner if needed.   Have a great day and stay safe!  Happy Birthday!!!!

## 2018-09-23 NOTE — Addendum Note (Signed)
Addended byMeriel Pica F on: 09/23/2018 04:12 PM   Modules accepted: Orders

## 2018-09-23 NOTE — Assessment & Plan Note (Signed)
Continues to remain sexually active.  Check STI today.  Discussed importance of safe sexual practice to reduce risk of acquisition/transmission of STI.

## 2018-09-23 NOTE — Progress Notes (Signed)
Subjective:    Patient ID: James Barry, male    DOB: [birth year], 63 y.o.   MRN: [id number]  Chief Complaint  Patient presents with  . PrEP     HPI:  James Barry is a 63 y.o. male on preexposure prophylaxis for HIV with Truvada who was last seen in the office for a phone visit on 07/06/2018 with good adherence and tolerance to Truvada.  Blood work at the time revealed he was negative for HIV, gonorrhea, chlamydia, with normal hepatic function, renal function, and electrolytes.  James Barry continues to take his Truvada as prescribed with no adverse side effects or missed doses.  No new sexual partners currently although does remain sexually active and uses condoms.  Continues to remain in a monogamous relationship with HIV negative partner. Denies fevers, chills, night sweats, headaches, changes in vision, neck pain/stiffness, nausea, diarrhea, vomiting, lesions or rashes.   Allergies  Allergen Reactions  . Buspirone   . Cephalexin       Outpatient Medications Prior to Visit  Medication Sig Dispense Refill  . acetaminophen (TYLENOL) 650 MG CR tablet Take 650 mg by mouth every 8 (eight) hours as needed for pain.    Marland Kitchen esomeprazole (NEXIUM) 40 MG capsule TAKE 1 CAPSULE BY MOUTH EVERY DAY 90 capsule 1  . FIBER PO Take by mouth.    . Multiple Vitamin (MULTIVITAMIN) capsule Take 1 capsule by mouth daily.    . sildenafil (VIAGRA) 100 MG tablet TAKE 1 TABLET BY MOUTH ONCE DAILY FOR ERECTILE DYSFUNCTION 90 tablet 0  . emtricitabine-tenofovir (TRUVADA) 200-300 MG tablet Take 1 tablet by mouth daily. 30 tablet 3   No facility-administered medications prior to visit.      Past Medical History:  Diagnosis Date  . Diverticulosis   . Erectile dysfunction   . GERD (gastroesophageal reflux disease)   . Hyperlipidemia   . Vertigo      Past Surgical History:  Procedure Laterality Date  . COLONOSCOPY  2013   cleared for 5 yrs- Dr @ [facility])       Review of  Systems  Constitutional: Negative for appetite change, chills, fatigue, fever and unexpected weight change.  Eyes: Negative for visual disturbance.  Respiratory: Negative for cough, chest tightness, shortness of breath and wheezing.   Cardiovascular: Negative for chest pain and leg swelling.  Gastrointestinal: Negative for abdominal pain, constipation, diarrhea, nausea and vomiting.  Genitourinary: Negative for dysuria, flank pain, frequency, genital sores, hematuria and urgency.  Skin: Negative for rash.  Allergic/Immunologic: Negative for immunocompromised state.  Neurological: Negative for dizziness and headaches.      Objective:    BP 129/88   Pulse 72   Temp 98.4 F (36.9 C) (Oral)   Wt 154 lb (69.9 kg)   BMI 24.12 kg/m  Nursing note and vital signs reviewed.  Physical Exam Constitutional:      General: He is not in acute distress.    Appearance: He is well-developed.  Eyes:     Conjunctiva/sclera: Conjunctivae normal.  Neck:     Musculoskeletal: Neck supple.  Cardiovascular:     Rate and Rhythm: Normal rate and regular rhythm.     Heart sounds: Normal heart sounds. No murmur. No friction rub. No gallop.   Pulmonary:     Effort: Pulmonary effort is normal. No respiratory distress.     Breath sounds: Normal breath sounds. No wheezing or rales.  Chest:     Chest wall: No tenderness.  Abdominal:  General: Bowel sounds are normal.     Palpations: Abdomen is soft.     Tenderness: There is no abdominal tenderness.  Lymphadenopathy:     Cervical: No cervical adenopathy.  Skin:    General: Skin is warm and dry.     Findings: No rash.  Neurological:     Mental Status: He is alert and oriented to person, place, and time.  Psychiatric:        Behavior: Behavior normal.        Thought Content: Thought content normal.        Judgment: Judgment normal.        Assessment & Plan:   Problem List Items Addressed This Visit      Other   On pre-exposure prophylaxis  for HIV - Primary    James Barry remains on preexposure prophylaxis for HIV with Truvada with good adherence and tolerance to the regimen.  Obtain STI and HIV testing.  Discussed importance of safe sexual practice to reduce risk of acquisition/transmission of STI.  Continue current dose of Truvada.      Relevant Medications   emtricitabine-tenofovir (TRUVADA) 200-300 MG tablet   Other Relevant Orders   CBC   COMPLETE METABOLIC PANEL WITH GFR   Cytology (oral, anal, urethral) ancillary only   Cytology (oral, anal, urethral) ancillary only   RPR   Urine cytology ancillary only([place])   HIV antibody (with reflex)   High risk homosexual behavior    Continues to remain sexually active.  Check STI today.  Discussed importance of safe sexual practice to reduce risk of acquisition/transmission of STI.      Relevant Orders   CBC   COMPLETE METABOLIC PANEL WITH GFR   Cytology (oral, anal, urethral) ancillary only   Cytology (oral, anal, urethral) ancillary only   RPR   Urine cytology ancillary only([place])   HIV antibody (with reflex)       I am having James Barry maintain his multivitamin, FIBER PO, acetaminophen, sildenafil, esomeprazole, and emtricitabine-tenofovir.   Meds ordered this encounter  Medications  . emtricitabine-tenofovir (TRUVADA) 200-300 MG tablet    Sig: Take 1 tablet by mouth daily.    Dispense:  30 tablet    Refill:  3    Order Specific Question:   Supervising Provider    Answer:   James Barry [[id number]]     Follow-up: Return in about 3 months (around 12/24/2018), or if symptoms worsen or fail to improve.   James Piedra, MSN, FNP-C Nurse Practitioner [facility] [place] number: [phone]

## 2018-09-23 NOTE — Assessment & Plan Note (Signed)
Mr. Bosket remains on preexposure prophylaxis for HIV with Truvada with good adherence and tolerance to the regimen.  Obtain STI and HIV testing.  Discussed importance of safe sexual practice to reduce risk of acquisition/transmission of STI.  Continue current dose of Truvada.

## 2018-09-24 LAB — COMPLETE METABOLIC PANEL WITH GFR
AG Ratio: 1.7 (calc) (ref 1.0–2.5)
ALT: 19 U/L (ref 9–46)
AST: 22 U/L (ref 10–35)
Albumin: 4.7 g/dL (ref 3.6–5.1)
Alkaline phosphatase (APISO): 71 U/L (ref 35–144)
BUN: 12 mg/dL (ref 7–25)
CO2: 29 mmol/L (ref 20–32)
Calcium: 10.1 mg/dL (ref 8.6–10.3)
Chloride: 103 mmol/L (ref 98–110)
Creat: 0.92 mg/dL (ref 0.70–1.25)
GFR, Est African American: 103 mL/min/{1.73_m2} (ref >=60)
GFR, Est Non African American: 89 mL/min/{1.73_m2} (ref >=60)
Globulin: 2.7 g/dL (calc) (ref 1.9–3.7)
Glucose, Bld: 90 mg/dL (ref 65–99)
Potassium: 4.4 mmol/L (ref 3.5–5.3)
Sodium: 140 mmol/L (ref 135–146)
Total Bilirubin: 0.4 mg/dL (ref 0.2–1.2)
Total Protein: 7.4 g/dL (ref 6.1–8.1)

## 2018-09-24 LAB — CBC
HCT: 40.1 % (ref 38.5–50.0)
Hemoglobin: 13.5 g/dL (ref 13.2–17.1)
MCH: 29.2 pg (ref 27.0–33.0)
MCHC: 33.7 g/dL (ref 32.0–36.0)
MCV: 86.8 fL (ref 80.0–100.0)
MPV: 10.9 fL (ref 7.5–12.5)
Platelets: 257 10*3/uL (ref 140–400)
RBC: 4.62 10*6/uL (ref 4.20–5.80)
RDW: 13.9 % (ref 11.0–15.0)
WBC: 11.2 10*3/uL — ABNORMAL HIGH (ref 3.8–10.8)

## 2018-09-24 LAB — RPR: RPR Ser Ql: NONREACTIVE

## 2018-09-24 LAB — HIV ANTIBODY (ROUTINE TESTING W REFLEX): HIV 1&2 Ab, 4th Generation: NONREACTIVE

## 2018-09-25 LAB — CYTOLOGY, (ORAL, ANAL, URETHRAL) ANCILLARY ONLY
Chlamydia: NEGATIVE
Chlamydia: NEGATIVE
Neisseria Gonorrhea: NEGATIVE
Neisseria Gonorrhea: NEGATIVE

## 2018-09-25 LAB — URINE CYTOLOGY ANCILLARY ONLY
Chlamydia: NEGATIVE
Neisseria Gonorrhea: NEGATIVE

## 2018-10-21 MED FILL — TRUVADA 200-300 MG TABS: 200-300 | 30 days supply | Qty: 30 | Fill #1

## 2018-11-06 ENCOUNTER — Encounter: Payer: Self-pay | Admitting: Primary Care

## 2018-11-06 ENCOUNTER — Other Ambulatory Visit: Payer: Self-pay

## 2018-11-06 ENCOUNTER — Ambulatory Visit: Payer: 59 | Admitting: Primary Care

## 2018-11-06 VITALS — BP 126/86 | HR 80 | Temp 98.0°F | Ht 67.0 in | Wt 158.2 lb

## 2018-11-06 DIAGNOSIS — R0789 Other chest pain: Secondary | ICD-10-CM | POA: Diagnosis not present

## 2018-11-06 DIAGNOSIS — Z23 Encounter for immunization: Secondary | ICD-10-CM | POA: Diagnosis not present

## 2018-11-06 NOTE — Progress Notes (Signed)
Subjective:    Patient ID: James Barry, male    DOB: 07/29/1955, 63 y.o.   MRN: [id number]  HPI  Mr. Maseda is a 63 year old male with a history of hypertension, GERD, hyperlipidemia (not on statin therapy), erectile dysfunction, chronic left shoulder pain who presents today with a chief complaint of chest discomfort.  His symptoms are located to the left anterior chest for which he's noticed intermittently for years. He also bilateral upper extremity aching that is intermittent without numbness and not always during the same time as his chest discomfort. His symptoms occur with exertion only, for example vacuuming, sweeping the floor, etc.   Yesterday he experienced one second of a sharp pain to the left chest. Typically when he's experiencing his symptoms he will continue with his activities and then rest when finished. The discomfort will typically "lighten up" but doesn't notice resolve. The achy feeling he notices in his chest is similar to the achy feeling in his arms. He does occasionally notice an achy feeling to the bilateral posterior shoulders and base of his neck.  He denies headaches, dizziness, diaphoresis, numbness/tinlging, shortness of breath. His symptoms do not occur with rest. Family history of heart disease in his father. When exercising at the gym (prior to Covid-19) he had no discomfort.   BP Readings from Last 3 Encounters:  11/06/18 126/86  09/23/18 129/88  09/21/18 122/80   Wt Readings from Last 3 Encounters:  11/06/18 158 lb 4 oz (71.8 kg)  09/23/18 154 lb (69.9 kg)  09/21/18 155 lb 8 oz (70.5 kg)     Review of Systems  Constitutional: Negative for fever.  Respiratory: Negative for shortness of breath.   Cardiovascular:       Chest discomfort  Musculoskeletal: Positive for myalgias.  Neurological: Negative for dizziness, weakness, numbness and headaches.       Past Medical History:  Diagnosis Date  . Diverticulosis   . Erectile dysfunction   .  GERD (gastroesophageal reflux disease)   . Hyperlipidemia   . Vertigo      Social History   Socioeconomic History  . Marital status: Married    Spouse name: Not on file  . Number of children: Not on file  . Years of education: Not on file  . Highest education level: Not on file  Occupational History  . Not on file  Social Needs  . Financial resource strain: Not on file  . Food insecurity    Worry: Not on file    Inability: Not on file  . Transportation needs    Medical: Not on file    Non-medical: Not on file  Tobacco Use  . Smoking status: Never Smoker  . Smokeless tobacco: Never Used  Substance and Sexual Activity  . Alcohol use: Yes    Alcohol/week: 0.0 standard drinks    Comment: social  . Drug use: No  . Sexual activity: Yes  Lifestyle  . Physical activity    Days per week: Not on file    Minutes per session: Not on file  . Stress: Not on file  Relationships  . Social Herbalist on phone: Not on file    Gets together: Not on file    Attends religious service: Not on file    Active member of club or organization: Not on file    Attends meetings of clubs or organizations: Not on file    Relationship status: Not on file  . Intimate partner  violence    Fear of current or ex partner: Not on file    Emotionally abused: Not on file    Physically abused: Not on file    Forced sexual activity: Not on file  Other Topics Concern  . Not on file  Social History Narrative   Married.   No children.   Retired.   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    Past Surgical History:  Procedure Laterality Date  . COLONOSCOPY  2013   cleared for 5 yrs- Dr @ [facility])    Family History  Problem Relation Age of Onset  . Breast cancer Mother   . Lung cancer Father   . Heart disease Father   . Hypertension Father   . Arthritis Maternal Grandmother     Allergies  Allergen Reactions  . Buspirone   .  Cephalexin     Current Outpatient Medications on File Prior to Visit  Medication Sig Dispense Refill  . acetaminophen (TYLENOL) 650 MG CR tablet Take 650 mg by mouth every 8 (eight) hours as needed for pain.    Marland Kitchen emtricitabine-tenofovir (TRUVADA) 200-300 MG tablet Take 1 tablet by mouth daily. 30 tablet 3  . esomeprazole (NEXIUM) 40 MG capsule TAKE 1 CAPSULE BY MOUTH EVERY DAY 90 capsule 1  . FIBER PO Take by mouth.    . Multiple Vitamin (MULTIVITAMIN) capsule Take 1 capsule by mouth daily.    . sildenafil (VIAGRA) 100 MG tablet TAKE 1 TABLET BY MOUTH ONCE DAILY FOR ERECTILE DYSFUNCTION 90 tablet 0   No current facility-administered medications on file prior to visit.     BP 126/86   Pulse 80   Temp 98 F (36.7 C) (Temporal)   Ht 5\' 7"  (1.702 m)   Wt 158 lb 4 oz (71.8 kg)   SpO2 98%   BMI 24.79 kg/m    Objective:   Physical Exam  Constitutional: He appears well-nourished.  Neck: Neck supple.  Cardiovascular: Normal rate and regular rhythm.  Respiratory: Effort normal and breath sounds normal.  Skin: Skin is warm and dry.  Psychiatric: He has a normal mood and affect.           Assessment & Plan:

## 2018-11-06 NOTE — Patient Instructions (Addendum)
Please consider an exercise stress test given your symptoms.  Talk it over with Richardson Landry and get back to me as discussed.  It was a pleasure to see you today!

## 2018-11-06 NOTE — Assessment & Plan Note (Addendum)
Intermittent for years, more noticeable now. Exam today benign.  Differentials include CAD, arthritis to shoulder/neck, DDD to cervical spine, MSK cause.  Recent labs unremarkable. ECG today with NSR, rate of 72. No PAC/PVC;s, t-wave inversion, acute ST changes. Appears similar to ECG from 2017.  His symptoms could very well be MSK/arthtritis but given family history of heart disease and his exertional symptoms I would like to send him for an exercise stress test.   He will talk this over with his spouse and update me with his decision.

## 2018-11-09 DIAGNOSIS — R0789 Other chest pain: Secondary | ICD-10-CM

## 2018-11-10 MED FILL — SILDENAFIL CITRATE 100 MG T: 100 | 30 days supply | Qty: 6 | Fill #2

## 2018-11-16 ENCOUNTER — Other Ambulatory Visit: Payer: Self-pay

## 2018-11-16 ENCOUNTER — Other Ambulatory Visit
Admission: RE | Admit: 2018-11-16 | Discharge: 2018-11-16 | Disposition: A | Discharge status: Home / Self Care | Payer: 59 | Source: Ambulatory Visit | Attending: Primary Care | Admitting: Primary Care

## 2018-11-16 DIAGNOSIS — Z20828 Contact with and (suspected) exposure to other viral communicable diseases: Secondary | ICD-10-CM | POA: Diagnosis not present

## 2018-11-16 DIAGNOSIS — Z01812 Encounter for preprocedural laboratory examination: Secondary | ICD-10-CM | POA: Diagnosis not present

## 2018-11-16 LAB — SARS CORONAVIRUS 2 (TAT 6-24 HRS): SARS Coronavirus 2: NEGATIVE

## 2018-11-18 MED FILL — TRUVADA 200-300 MG TABS: 200-300 | 30 days supply | Qty: 30 | Fill #2

## 2018-11-19 ENCOUNTER — Other Ambulatory Visit: Payer: Self-pay

## 2018-11-19 ENCOUNTER — Ambulatory Visit (INDEPENDENT_AMBULATORY_CARE_PROVIDER_SITE_OTHER): Payer: 59

## 2018-11-19 DIAGNOSIS — R0789 Other chest pain: Secondary | ICD-10-CM | POA: Diagnosis not present

## 2018-11-20 LAB — EXERCISE TOLERANCE TEST
Estimated workload: 8.3 METS
Exercise duration (min): 6 min
Exercise duration (sec): 51 s
MPHR: 157 {beats}/min
Peak HR: 153 {beats}/min
Percent HR: 97 %
Rest HR: 84 {beats}/min

## 2018-12-09 MED FILL — SILDENAFIL CITRATE 100 MG T: 100 | 30 days supply | Qty: 6 | Fill #3

## 2018-12-23 MED FILL — TRUVADA 200-300 MG TABS: 200-300 | 30 days supply | Qty: 30 | Fill #3

## 2018-12-24 ENCOUNTER — Other Ambulatory Visit: Payer: Self-pay | Admitting: Pharmacist

## 2018-12-24 ENCOUNTER — Other Ambulatory Visit: Payer: Self-pay

## 2018-12-24 ENCOUNTER — Other Ambulatory Visit (HOSPITAL_COMMUNITY)
Admission: RE | Admit: 2018-12-24 | Discharge: 2018-12-24 | Disposition: A | Discharge status: Home / Self Care | Payer: 59 | Source: Ambulatory Visit | Attending: Family | Admitting: Family

## 2018-12-24 ENCOUNTER — Ambulatory Visit: Payer: 59 | Admitting: Family

## 2018-12-24 ENCOUNTER — Encounter: Payer: Self-pay | Admitting: Family

## 2018-12-24 VITALS — BP 124/86 | HR 100 | Temp 98.1°F

## 2018-12-24 DIAGNOSIS — Z7252 High risk homosexual behavior: Secondary | ICD-10-CM | POA: Diagnosis not present

## 2018-12-24 DIAGNOSIS — Z79899 Other long term (current) drug therapy: Secondary | ICD-10-CM | POA: Diagnosis not present

## 2018-12-24 MED ORDER — EMTRICITABINE-TENOFOVIR DF 200-300 MG PO TABS: 1.0000 | ORAL_TABLET | Freq: Every day | ORAL | 3 refills | Status: DC

## 2018-12-24 MED FILL — ESOMEPRAZOLE MAG DR 40 MG C: 40 | 90 days supply | Qty: 90 | Fill #1

## 2018-12-24 NOTE — Assessment & Plan Note (Signed)
James Barry continues to do well with Truvada and has good adherence and no adverse side effects. Previous HIV and STI testing negative and will recheck lab work today. Discussed importance of safe sexual practice to reduce risk of STI. Continue current dose of Truvada with follow up in 3 months or sooner if needed.

## 2018-12-24 NOTE — Progress Notes (Signed)
Subjective:    Patient ID: James HARTFIEL, male    DOB: Jul 27, 1955, 63 y.o.   MRN: [id number]  Chief Complaint  Patient presents with  . Follow-up    doing well     HPI:  James Barry is a 63 y.o. male on PrEP who was last seen in the office on 09/23/18 with good adherence and tolerance to his regimen of Truvada. Previous testing for HIV and STI were negative.   James Barry continues to take his Truvada as prescribed with no adverse side effects or missed doses. Overall feeling very well today. Denies fevers, chills, night sweats, headaches, changes in vision, neck pain/stiffness, nausea, diarrhea, vomiting, lesions or rashes.  James Barry has no problems obtaining his medication from the pharmacy. Denies any recent feelings of being down, depressed or hopeless. Remains in an open relationship with his husband. Recently traveled to [place] for vacation.    Allergies  Allergen Reactions  . Buspirone   . Cephalexin       Outpatient Medications Prior to Visit  Medication Sig Dispense Refill  . acetaminophen (TYLENOL) 650 MG CR tablet Take 650 mg by mouth every 8 (eight) hours as needed for pain.    Marland Kitchen esomeprazole (NEXIUM) 40 MG capsule TAKE 1 CAPSULE BY MOUTH EVERY DAY 90 capsule 1  . FIBER PO Take by mouth.    . Multiple Vitamin (MULTIVITAMIN) capsule Take 1 capsule by mouth daily.    . sildenafil (VIAGRA) 100 MG tablet TAKE 1 TABLET BY MOUTH ONCE DAILY FOR ERECTILE DYSFUNCTION 90 tablet 0  . emtricitabine-tenofovir (TRUVADA) 200-300 MG tablet Take 1 tablet by mouth daily. 30 tablet 3   No facility-administered medications prior to visit.      Past Medical History:  Diagnosis Date  . Diverticulosis   . Erectile dysfunction   . GERD (gastroesophageal reflux disease)   . Hyperlipidemia   . Vertigo      Past Surgical History:  Procedure Laterality Date  . COLONOSCOPY  2013   cleared for 5 yrs- Dr @ [facility] Narda Amber)       Review of Systems  Constitutional:  Negative for appetite change, chills, fatigue, fever and unexpected weight change.  Eyes: Negative for visual disturbance.  Respiratory: Negative for cough, chest tightness, shortness of breath and wheezing.   Cardiovascular: Negative for chest pain and leg swelling.  Gastrointestinal: Negative for abdominal pain, constipation, diarrhea, nausea and vomiting.  Genitourinary: Negative for dysuria, flank pain, frequency, genital sores, hematuria and urgency.  Skin: Negative for rash.  Allergic/Immunologic: Negative for immunocompromised state.  Neurological: Negative for dizziness and headaches.      Objective:    BP 124/86   Pulse 100   Temp 98.1 F (36.7 C) (Oral)   SpO2 95%  Nursing note and vital signs reviewed.  Physical Exam Constitutional:      General: He is not in acute distress.    Appearance: He is well-developed.  Eyes:     Conjunctiva/sclera: Conjunctivae normal.  Neck:     Musculoskeletal: Neck supple.  Cardiovascular:     Rate and Rhythm: Normal rate and regular rhythm.     Heart sounds: Normal heart sounds. No murmur. No friction rub. No gallop.   Pulmonary:     Effort: Pulmonary effort is normal. No respiratory distress.     Breath sounds: Normal breath sounds. No wheezing or rales.  Chest:     Chest wall: No tenderness.  Abdominal:     General:  Bowel sounds are normal.     Palpations: Abdomen is soft.     Tenderness: There is no abdominal tenderness.  Lymphadenopathy:     Cervical: No cervical adenopathy.  Skin:    General: Skin is warm and dry.     Findings: No rash.  Neurological:     Mental Status: He is alert and oriented to person, place, and time.  Psychiatric:        Behavior: Behavior normal.        Thought Content: Thought content normal.        Judgment: Judgment normal.     Depression screen [facility] 2/9 12/24/2018 09/23/2018 07/06/2018 08/13/2017 08/02/2015  Decreased Interest 0 0 0 0 0  Down, Depressed, Hopeless 0 0 0 0 0  PHQ - 2 Score 0 0 0  0 0       Assessment & Plan:    Patient Active Problem List   Diagnosis Date Noted  . Chest discomfort 11/06/2018  . 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 Groin discomfort 09/21/2018  . Preventative health care 08/24/2018  . On pre-exposure prophylaxis for HIV 12/17/2017  . Chronic left shoulder pain 08/13/2017  . Nocturia 08/13/2017  . Exposure to HIV 06/06/2017  . Erectile dysfunction 06/28/2015  . 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 Routine screening for STI (sexually transmitted infection) 07/14/2014  . Abnormal LFTs 07/14/2014  . Essential (primary) hypertension 07/14/2014  . Acid reflux 07/14/2014     Problem List Items Addressed This Visit      Other   On pre-exposure prophylaxis for HIV - Primary    James Barry continues to do well with Truvada and has good adherence and no adverse side effects. Previous HIV and STI testing negative and will recheck lab work today. Discussed importance of safe sexual practice to reduce risk of STI. Continue current dose of Truvada with follow up in 3 months or sooner if needed.       Relevant Orders   COMPLETE METABOLIC PANEL WITH GFR   Cytology (oral, anal, urethral) ancillary only   HIV antibody (with reflex)   RPR   Urine cytology ancillary only([place])   High risk homosexual behavior   Relevant Orders   COMPLETE METABOLIC PANEL WITH GFR   Cytology (oral, anal, urethral) ancillary only   HIV antibody (with reflex)   RPR   Urine cytology ancillary only([place])       I have discontinued Annie Main D. Rivard's emtricitabine-tenofovir. I am also having him maintain his multivitamin, FIBER PO, acetaminophen, sildenafil, and esomeprazole.   Meds ordered this encounter  Medications  . DISCONTD: emtricitabine-tenofovir (TRUVADA) 200-300 MG tablet    Sig: Take 1 tablet by mouth daily.    Dispense:  30 tablet    Refill:  3    Order Specific Question:   Supervising Provider    Answer:    Carlyle Basques [[id number]]     Follow-up: Return in about 3 months (around 03/26/2019), or if symptoms worsen or fail to improve.   Terri Piedra, MSN, FNP-C Nurse Practitioner [facility] for Infectious Disease [place] number: [phone]

## 2018-12-24 NOTE — Patient Instructions (Signed)
Nice to see you.  Continue to take your Truvada as prescribed.  We will check your lab work today.  Refills of your medication have been sent to the pharmacy.  Plan for follow up in 3 months or sooner if needed.  Have a great day and stay safe!

## 2018-12-25 LAB — COMPLETE METABOLIC PANEL WITH GFR
AG Ratio: 1.7 (calc) (ref 1.0–2.5)
ALT: 19 U/L (ref 9–46)
AST: 21 U/L (ref 10–35)
Albumin: 4.7 g/dL (ref 3.6–5.1)
Alkaline phosphatase (APISO): 71 U/L (ref 35–144)
BUN: 14 mg/dL (ref 7–25)
CO2: 29 mmol/L (ref 20–32)
Calcium: 9.7 mg/dL (ref 8.6–10.3)
Chloride: 102 mmol/L (ref 98–110)
Creat: 0.89 mg/dL (ref 0.70–1.25)
GFR, Est African American: 105 mL/min/{1.73_m2} (ref >=60)
GFR, Est Non African American: 91 mL/min/{1.73_m2} (ref >=60)
Globulin: 2.8 g/dL (calc) (ref 1.9–3.7)
Glucose, Bld: 91 mg/dL (ref 65–99)
Potassium: 4.3 mmol/L (ref 3.5–5.3)
Sodium: 139 mmol/L (ref 135–146)
Total Bilirubin: 0.3 mg/dL (ref 0.2–1.2)
Total Protein: 7.5 g/dL (ref 6.1–8.1)

## 2018-12-25 LAB — HIV ANTIBODY (ROUTINE TESTING W REFLEX): HIV 1&2 Ab, 4th Generation: NONREACTIVE

## 2018-12-25 LAB — RPR: RPR Ser Ql: NONREACTIVE

## 2019-01-01 ENCOUNTER — Telehealth: Payer: Self-pay | Admitting: Pharmacist

## 2019-01-01 NOTE — Telephone Encounter (Signed)
Called patient to follow up for [facility]. He is doing well on Truvada, labs are good and denies any adverse effects. Currently receiving care at [facility] so no longer needs to be in this program. Patient aware.

## 2019-01-02 MED FILL — SILDENAFIL CITRATE 100 MG T: 100 | 30 days supply | Qty: 6 | Fill #4

## 2019-01-04 ENCOUNTER — Other Ambulatory Visit: Payer: Self-pay | Admitting: Family

## 2019-01-04 LAB — CYTOLOGY, (ORAL, ANAL, URETHRAL) ANCILLARY ONLY
Chlamydia: NEGATIVE
Comment: NEGATIVE
Comment: NORMAL
Neisseria Gonorrhea: NEGATIVE

## 2019-01-04 LAB — URINE CYTOLOGY ANCILLARY ONLY
Chlamydia: NEGATIVE
Comment: NEGATIVE
Comment: NORMAL
Neisseria Gonorrhea: NEGATIVE

## 2019-01-04 MED ORDER — AZITHROMYCIN 250 MG PO TABS: ORAL_TABLET | ORAL | 0 refills | Status: DC

## 2019-01-04 MED FILL — AZITHROMYCIN 250 MG TABS: 250 | 1 days supply | Qty: 4 | Fill #0

## 2019-01-18 MED FILL — TRUVADA 200-300 MG TABS: 200-300 | 30 days supply | Qty: 30 | Fill #0

## 2019-01-28 MED FILL — SILDENAFIL CITRATE 100 MG T: 100 | 10 days supply | Qty: 10 | Fill #5

## 2019-02-17 MED FILL — TRUVADA 200-300 MG TABS: 200-300 | 30 days supply | Qty: 30 | Fill #1

## 2019-03-05 MED FILL — SILDENAFIL CITRATE 100 MG T: 100 | 10 days supply | Qty: 10 | Fill #6

## 2019-03-17 MED FILL — TRUVADA 200-300 MG TABS: 200-300 | 30 days supply | Qty: 30 | Fill #2

## 2019-03-24 ENCOUNTER — Telehealth: Payer: Self-pay

## 2019-03-24 NOTE — Telephone Encounter (Signed)
COVID-19 Pre-Screening Questions:03/24/19   Do you currently have a fever (>100 F), chills or unexplained body aches? NO  Are you currently experiencing new cough, shortness of breath, sore throat, runny nose?NO .  Have you recently travelled outside the state of [place] in the last 14 days? NO .  Have you been in contact with someone that is currently pending confirmation of Covid19 testing or has been confirmed to have the [place] virus?  NO   **If the patient answers NO to ALL questions -  advise the patient to please call the clinic before coming to the office should any symptoms develop.

## 2019-03-25 ENCOUNTER — Encounter: Payer: Self-pay | Admitting: Family

## 2019-03-25 ENCOUNTER — Ambulatory Visit (INDEPENDENT_AMBULATORY_CARE_PROVIDER_SITE_OTHER): Payer: 59 | Admitting: Family

## 2019-03-25 ENCOUNTER — Other Ambulatory Visit (HOSPITAL_COMMUNITY)
Admission: RE | Admit: 2019-03-25 | Discharge: 2019-03-25 | Disposition: A | Discharge status: Home / Self Care | Payer: 59 | Source: Ambulatory Visit | Attending: Family | Admitting: Family

## 2019-03-25 ENCOUNTER — Other Ambulatory Visit: Payer: Self-pay

## 2019-03-25 VITALS — BP 131/81 | HR 80 | Wt 159.4 lb

## 2019-03-25 DIAGNOSIS — Z79899 Other long term (current) drug therapy: Secondary | ICD-10-CM | POA: Diagnosis not present

## 2019-03-25 DIAGNOSIS — Z7252 High risk homosexual behavior: Secondary | ICD-10-CM | POA: Insufficient documentation

## 2019-03-25 MED ORDER — EMTRICITABINE-TENOFOVIR DF 200-300 MG PO TABS: 1.0000 | ORAL_TABLET | Freq: Every day | ORAL | 3 refills | Status: DC

## 2019-03-25 NOTE — Assessment & Plan Note (Addendum)
James Barry is well maintained on Truvada with no adverse side effects or missed doses.  Reviewed previous lab work and discussed plan of care.  We will check blood work today.  Continue current dose of Truvada and plan for follow up in 3 months or sooner if needed.

## 2019-03-25 NOTE — Patient Instructions (Signed)
Nice to see you.  We will check your blood work today.  Continue to take your Truvada as prescribed.  Refills have been sent to the pharmacy.  Plan for follow up in 3 months or sooner if needed.  Have a great day and stay safe!  Happy New Year!

## 2019-03-25 NOTE — Progress Notes (Signed)
Subjective:    Patient ID: James Barry, male    DOB: 12-19-1955, 64 y.o.   MRN: [id number]  Chief Complaint  Patient presents with  . Follow-up    no complaints; received flu shot;      HPI:  James Barry is a 64 y.o. male who was last seen in the office on 12/24/2018 with good adherence and tolerance to his Truvada on PrEP.  Here today for routine follow-up.  James Barry continues to take his Truvada as prescribed daily with no adverse side effects or missed doses since his last office visit.  Overall feeling well today with no new concerns/complaints. Denies fevers, chills, night sweats, headaches, changes in vision, neck pain/stiffness, nausea, diarrhea, vomiting, lesions or rashes.   Allergies  Allergen Reactions  . Buspirone   . Cephalexin       Outpatient Medications Prior to Visit  Medication Sig Dispense Refill  . acetaminophen (TYLENOL) 650 MG CR tablet Take 650 mg by mouth every 8 (eight) hours as needed for pain.    Marland Kitchen esomeprazole (NEXIUM) 40 MG capsule TAKE 1 CAPSULE BY MOUTH EVERY DAY 90 capsule 1  . FIBER PO Take by mouth.    . Multiple Vitamin (MULTIVITAMIN) capsule Take 1 capsule by mouth daily.    . sildenafil (VIAGRA) 100 MG tablet TAKE 1 TABLET BY MOUTH ONCE DAILY FOR ERECTILE DYSFUNCTION 90 tablet 0  . azithromycin (ZITHROMAX) 250 MG tablet Take 4 tablets by mouth once. (Patient not taking: Reported on 03/25/2019) 4 each 0  . emtricitabine-tenofovir (TRUVADA) 200-300 MG tablet Take 1 tablet by mouth daily. 30 tablet 3   No facility-administered medications prior to visit.     Past Medical History:  Diagnosis Date  . Diverticulosis   . Erectile dysfunction   . GERD (gastroesophageal reflux disease)   . Hyperlipidemia   . Vertigo      Past Surgical History:  Procedure Laterality Date  . COLONOSCOPY  2013   cleared for 5 yrs- Dr @ [facility] Narda Amber)       Review of Systems  Constitutional: Negative for appetite change, chills,  fatigue, fever and unexpected weight change.  Eyes: Negative for visual disturbance.  Respiratory: Negative for cough, chest tightness, shortness of breath and wheezing.   Cardiovascular: Negative for chest pain and leg swelling.  Gastrointestinal: Negative for abdominal pain, constipation, diarrhea, nausea and vomiting.  Genitourinary: Negative for dysuria, flank pain, frequency, genital sores, hematuria and urgency.  Skin: Negative for rash.  Allergic/Immunologic: Negative for immunocompromised state.  Neurological: Negative for dizziness and headaches.      Objective:    BP 131/81   Pulse 80   Wt 159 lb 6.4 oz (72.3 kg)   BMI 24.97 kg/m  Nursing note and vital signs reviewed.  Physical Exam Constitutional:      General: He is not in acute distress.    Appearance: He is well-developed.  Eyes:     Conjunctiva/sclera: Conjunctivae normal.  Cardiovascular:     Rate and Rhythm: Normal rate and regular rhythm.     Heart sounds: Normal heart sounds. No murmur. No friction rub. No gallop.   Pulmonary:     Effort: Pulmonary effort is normal. No respiratory distress.     Breath sounds: Normal breath sounds. No wheezing or rales.  Chest:     Chest wall: No tenderness.  Abdominal:     General: Bowel sounds are normal.     Palpations: Abdomen is soft.  Tenderness: There is no abdominal tenderness.  Musculoskeletal:     Cervical back: Neck supple.  Lymphadenopathy:     Cervical: No cervical adenopathy.  Skin:    General: Skin is warm and dry.     Findings: No rash.  Neurological:     Mental Status: He is alert and oriented to person, place, and time.  Psychiatric:        Behavior: Behavior normal.        Thought Content: Thought content normal.        Judgment: Judgment normal.      Depression screen [facility] 2/9 03/25/2019 12/24/2018 09/23/2018 07/06/2018 08/13/2017  Decreased Interest 0 0 0 0 0  Down, Depressed, Hopeless 0 0 0 0 0  PHQ - 2 Score 0 0 0 0 0       Assessment &  Plan:    Patient Active Problem List   Diagnosis Date Noted  . Chest discomfort 11/06/2018  . 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 Groin discomfort 09/21/2018  . Preventative health care 08/24/2018  . On pre-exposure prophylaxis for HIV 12/17/2017  . Chronic left shoulder pain 08/13/2017  . Nocturia 08/13/2017  . Exposure to HIV 06/06/2017  . Erectile dysfunction 06/28/2015  . 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 Routine screening for STI (sexually transmitted infection) 07/14/2014  . Abnormal LFTs 07/14/2014  . Essential (primary) hypertension 07/14/2014  . Acid reflux 07/14/2014     Problem List Items Addressed This Visit      Other   On pre-exposure prophylaxis for HIV    James Barry is well maintained on Truvada with no adverse side effects or missed doses.  Reviewed previous lab work and discussed plan of care.  We will check blood work today.  Continue current dose of Truvada and plan for follow up in 3 months or sooner if needed.       Relevant Medications   emtricitabine-tenofovir (TRUVADA) 200-300 MG tablet   Other Relevant Orders   RPR   HIV antibody   Basic metabolic panel   Cytology (oral, anal, urethral) ancillary only   Cytology (oral, anal, urethral) ancillary only   Urine cytology ancillary only([place])   High risk homosexual behavior - Primary    Discussed importance of safe sexual practice to reduce risk of STI.       Relevant Orders   RPR   HIV antibody   Basic metabolic panel   Cytology (oral, anal, urethral) ancillary only   Cytology (oral, anal, urethral) ancillary only   Urine cytology ancillary only([place])       I have discontinued Annie Main D. Callaham's azithromycin. I am also having him maintain his multivitamin, FIBER PO, acetaminophen, sildenafil, esomeprazole, and emtricitabine-tenofovir.   Meds ordered this encounter  Medications  . emtricitabine-tenofovir (TRUVADA) 200-300 MG tablet    Sig:  Take 1 tablet by mouth daily.    Dispense:  30 tablet    Refill:  3    Order Specific Question:   Supervising Provider    Answer:   Carlyle Basques [[id number]]     Follow-up: Return in about 3 months (around 06/23/2019), or if symptoms worsen or fail to improve.   Terri Piedra, MSN, FNP-C Nurse Practitioner [facility] for Infectious Disease [place] number: [phone]

## 2019-03-25 NOTE — Assessment & Plan Note (Signed)
Discussed importance of safe sexual practice to reduce risk of STI.

## 2019-03-26 LAB — CYTOLOGY, (ORAL, ANAL, URETHRAL) ANCILLARY ONLY
Chlamydia: NEGATIVE
Chlamydia: NEGATIVE
Comment: NEGATIVE
Comment: NEGATIVE
Comment: NORMAL
Comment: NORMAL
Neisseria Gonorrhea: NEGATIVE
Neisseria Gonorrhea: NEGATIVE

## 2019-03-26 LAB — HIV ANTIBODY (ROUTINE TESTING W REFLEX): HIV 1&2 Ab, 4th Generation: NONREACTIVE

## 2019-03-26 LAB — RPR: RPR Ser Ql: NONREACTIVE

## 2019-03-26 LAB — BASIC METABOLIC PANEL
BUN: 13 mg/dL (ref 7–25)
CO2: 29 mmol/L (ref 20–32)
Calcium: 10.1 mg/dL (ref 8.6–10.3)
Chloride: 102 mmol/L (ref 98–110)
Creat: 0.93 mg/dL (ref 0.70–1.25)
Glucose, Bld: 93 mg/dL (ref 65–99)
Potassium: 4.3 mmol/L (ref 3.5–5.3)
Sodium: 138 mmol/L (ref 135–146)

## 2019-03-26 LAB — URINE CYTOLOGY ANCILLARY ONLY
Chlamydia: NEGATIVE
Comment: NEGATIVE
Comment: NORMAL
Neisseria Gonorrhea: NEGATIVE

## 2019-03-30 ENCOUNTER — Other Ambulatory Visit: Payer: Self-pay | Admitting: Primary Care

## 2019-03-30 DIAGNOSIS — K219 Gastro-esophageal reflux disease without esophagitis: Secondary | ICD-10-CM

## 2019-03-30 MED FILL — ESOMEPRAZOLE MAG DR 40 MG C: 40 | 90 days supply | Qty: 90 | Fill #0

## 2019-04-01 MED FILL — SILDENAFIL CITRATE 100 MG T: 100 | 10 days supply | Qty: 10 | Fill #7

## 2019-04-19 MED FILL — TRUVADA 200-300 MG TABS: 200-300 | 30 days supply | Qty: 30 | Fill #3

## 2019-04-21 DIAGNOSIS — L821 Other seborrheic keratosis: Secondary | ICD-10-CM | POA: Diagnosis not present

## 2019-04-21 DIAGNOSIS — D225 Melanocytic nevi of trunk: Secondary | ICD-10-CM | POA: Diagnosis not present

## 2019-04-21 DIAGNOSIS — D1801 Hemangioma of skin and subcutaneous tissue: Secondary | ICD-10-CM | POA: Diagnosis not present

## 2019-04-21 DIAGNOSIS — L218 Other seborrheic dermatitis: Secondary | ICD-10-CM | POA: Diagnosis not present

## 2019-04-21 DIAGNOSIS — D485 Neoplasm of uncertain behavior of skin: Secondary | ICD-10-CM | POA: Diagnosis not present

## 2019-04-21 MED FILL — TRIAMCINOLONE 0.025% CREAM: 0.025 | 30 days supply | Qty: 80 | Fill #0

## 2019-05-24 MED FILL — TRUVADA 200-300 MG TABS: 200-300 | 30 days supply | Qty: 30 | Fill #0

## 2019-06-02 ENCOUNTER — Other Ambulatory Visit: Payer: Self-pay | Admitting: Primary Care

## 2019-06-02 DIAGNOSIS — N529 Male erectile dysfunction, unspecified: Secondary | ICD-10-CM

## 2019-06-02 NOTE — Telephone Encounter (Signed)
Last prescribed on 05/07/2018 . Last appointment on 11/06/2018. Next future appointment on 08/26/2019

## 2019-06-02 NOTE — Telephone Encounter (Signed)
Noted, refill sent to pharmacy. 

## 2019-06-03 MED FILL — SILDENAFIL CITRATE 100 MG T: 100 | 90 days supply | Qty: 90 | Fill #0

## 2019-06-21 MED FILL — TRUVADA 200-300 MG TABS: 200-300 | 30 days supply | Qty: 30 | Fill #1

## 2019-06-29 MED FILL — ESOMEPRAZOLE MAG DR 40 MG C: 40 | 90 days supply | Qty: 90 | Fill #1

## 2019-07-05 ENCOUNTER — Other Ambulatory Visit: Payer: Self-pay | Admitting: Pharmacist

## 2019-07-05 ENCOUNTER — Encounter: Payer: Self-pay | Admitting: Family

## 2019-07-05 ENCOUNTER — Other Ambulatory Visit (HOSPITAL_COMMUNITY)
Admission: RE | Admit: 2019-07-05 | Discharge: 2019-07-05 | Disposition: A | Discharge status: Home / Self Care | Payer: 59 | Source: Ambulatory Visit | Attending: Family | Admitting: Family

## 2019-07-05 ENCOUNTER — Other Ambulatory Visit: Payer: Self-pay

## 2019-07-05 ENCOUNTER — Ambulatory Visit: Payer: 59 | Admitting: Family

## 2019-07-05 VITALS — BP 146/89 | HR 80 | Temp 98.0°F | Ht 67.0 in | Wt 159.2 lb

## 2019-07-05 DIAGNOSIS — Z7252 High risk homosexual behavior: Secondary | ICD-10-CM | POA: Diagnosis not present

## 2019-07-05 DIAGNOSIS — Z79899 Other long term (current) drug therapy: Secondary | ICD-10-CM

## 2019-07-05 MED ORDER — EMTRICITABINE-TENOFOVIR DF 200-300 MG PO TABS: 1.0000 | ORAL_TABLET | Freq: Every day | ORAL | 3 refills | Status: DC

## 2019-07-05 NOTE — Progress Notes (Signed)
Subjective:    Patient ID: James Barry, male    DOB: [birth year], 64 y.o.   MRN: [id number]  Chief Complaint  Patient presents with  . Follow-up    3 month follow up     HPI:  James Barry is a 65 y.o. male on PrEP for HIV prophylaxis presents today for routine follow-up and renewal of medication.  James Barry continues to take his Truvada as prescribed with no adverse side effects. Denies fevers, chills, night sweats, headaches, changes in vision, neck pain/stiffness, nausea, diarrhea, vomiting, lesions or rashes.  James Barry is no problems obtaining his medication from the pharmacy and remains covered through [employer].    Allergies  Allergen Reactions  . Buspirone   . Cephalexin       Outpatient Medications Prior to Visit  Medication Sig Dispense Refill  . acetaminophen (TYLENOL) 650 MG CR tablet Take 650 mg by mouth every 8 (eight) hours as needed for pain.    Marland Kitchen esomeprazole (NEXIUM) 40 MG capsule TAKE 1 CAPSULE BY MOUTH EVERY DAY 90 capsule 1  . FIBER PO Take by mouth.    . Multiple Vitamin (MULTIVITAMIN) capsule Take 1 capsule by mouth daily.    . sildenafil (VIAGRA) 100 MG tablet Take 1 tablet (100 mg total) by mouth as needed for erectile dysfunction. 90 tablet 0  . emtricitabine-tenofovir (TRUVADA) 200-300 MG tablet Take 1 tablet by mouth daily. 30 tablet 3   No facility-administered medications prior to visit.     Past Medical History:  Diagnosis Date  . Diverticulosis   . Erectile dysfunction   . GERD (gastroesophageal reflux disease)   . Hyperlipidemia   . Vertigo      Past Surgical History:  Procedure Laterality Date  . COLONOSCOPY  2013   cleared for 5 yrs- Dr @ [facility] N[facility])      Review of Systems  Constitutional: Negative for appetite change, chills, fatigue, fever and unexpected weight change.  Eyes: Negative for visual disturbance.  Respiratory: Negative for cough, chest tightness, shortness of breath  and wheezing.   Cardiovascular: Negative for chest pain and leg swelling.  Gastrointestinal: Negative for abdominal pain, constipation, diarrhea, nausea and vomiting.  Genitourinary: Negative for dysuria, flank pain, frequency, genital sores, hematuria and urgency.  Skin: Negative for rash.  Allergic/Immunologic: Negative for immunocompromised state.  Neurological: Negative for dizziness and headaches.      Objective:    BP (!) 146/89 (BP Location: Left Arm)   Pulse 80   Temp 98 F (36.7 C)   Ht 5\' 7"  (1.702 m)   Wt 159 lb 3.2 oz (72.2 kg)   SpO2 96%   BMI 24.93 kg/m  Nursing note and vital signs reviewed.  Physical Exam Constitutional:      General: He is not in acute distress.    Appearance: He is well-developed.  Eyes:     Conjunctiva/sclera: Conjunctivae normal.  Cardiovascular:     Rate and Rhythm: Normal rate and regular rhythm.     Heart sounds: Normal heart sounds. No murmur. No friction rub. No gallop.   Pulmonary:     Effort: Pulmonary effort is normal. No respiratory distress.     Breath sounds: Normal breath sounds. No wheezing or rales.  Chest:     Chest wall: No tenderness.  Abdominal:     General: Bowel sounds are normal.     Palpations: Abdomen is soft.     Tenderness: There is no abdominal tenderness.  Musculoskeletal:     Cervical back: Neck supple.  Lymphadenopathy:     Cervical: No cervical adenopathy.  Skin:    General: Skin is warm and dry.     Findings: No rash.  Neurological:     Mental Status: He is alert and oriented to person, place, and time.  Psychiatric:        Behavior: Behavior normal.        Thought Content: Thought content normal.        Judgment: Judgment normal.      Depression screen [facility] 2/9 03/25/2019 12/24/2018 09/23/2018 07/06/2018 08/13/2017  Decreased Interest 0 0 0 0 0  Down, Depressed, Hopeless 0 0 0 0 0  PHQ - 2 Score 0 0 0 0 0       Assessment & Plan:    Patient Active Problem List   Diagnosis Date Noted  .  Chest discomfort 11/06/2018  . 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 Groin discomfort 09/21/2018  . Preventative health care 08/24/2018  . On pre-exposure prophylaxis for HIV 12/17/2017  . Chronic left shoulder pain 08/13/2017  . Nocturia 08/13/2017  . Exposure to HIV 06/06/2017  . Erectile dysfunction 06/28/2015  . 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 Routine screening for STI (sexually transmitted infection) 07/14/2014  . Abnormal LFTs 07/14/2014  . Essential (primary) hypertension 07/14/2014  . Acid reflux 07/14/2014     Problem List Items Addressed This Visit      Other   On pre-exposure prophylaxis for HIV - Primary    James Barry continues to take his Truvada as prescribed with no adverse side effects or missed doses.  Overall feeling well today.  Check routine blood work.  Continue current dose of Truvada.  Discussed plan of care and will plan for follow-up in 3 months or sooner if needed.      Relevant Orders   COMPLETE METABOLIC PANEL WITH GFR (Completed)   RPR   Cytology (oral, anal, urethral) ancillary only   Cytology (oral, anal, urethral) ancillary only   Urine cytology ancillary only([place])   HIV antibody (with reflex)   CBC (Completed)   High risk homosexual behavior   Relevant Orders   COMPLETE METABOLIC PANEL WITH GFR (Completed)   RPR   Cytology (oral, anal, urethral) ancillary only   Cytology (oral, anal, urethral) ancillary only   Urine cytology ancillary only([place])   HIV antibody (with reflex)   CBC (Completed)       I have discontinued Annie Main D. Pitsenbarger's emtricitabine-tenofovir. I am also having him maintain his multivitamin, FIBER PO, acetaminophen, esomeprazole, and sildenafil.   Meds ordered this encounter  Medications  . DISCONTD: emtricitabine-tenofovir (TRUVADA) 200-300 MG tablet    Sig: Take 1 tablet by mouth daily.    Dispense:  30 tablet    Refill:  3    Order Specific Question:    Supervising Provider    Answer:   Carlyle Basques [[id number]]     Follow-up: Return in about 3 months (around 10/04/2019).   Terri Piedra, MSN, FNP-C Nurse Practitioner [facility] for Infectious Disease [place] number: [phone]

## 2019-07-05 NOTE — Patient Instructions (Signed)
Nice to see you.  We will check your lab work today.  Continue to take your Truvada as prescribed.  Refills will be sent to the pharmacy.   Plan for follow up in 3 months or sooner if needed.  Have a great day and stay safe!

## 2019-07-06 LAB — RPR: RPR Ser Ql: NONREACTIVE

## 2019-07-06 LAB — CBC
HCT: 40.5 % (ref 38.5–50.0)
Hemoglobin: 13.6 g/dL (ref 13.2–17.1)
MCH: 29.6 pg (ref 27.0–33.0)
MCHC: 33.6 g/dL (ref 32.0–36.0)
MCV: 88.2 fL (ref 80.0–100.0)
MPV: 10.4 fL (ref 7.5–12.5)
Platelets: 277 10*3/uL (ref 140–400)
RBC: 4.59 10*6/uL (ref 4.20–5.80)
RDW: 13.4 % (ref 11.0–15.0)
WBC: 7.7 10*3/uL (ref 3.8–10.8)

## 2019-07-06 LAB — COMPLETE METABOLIC PANEL WITH GFR
AG Ratio: 1.8 (calc) (ref 1.0–2.5)
ALT: 18 U/L (ref 9–46)
AST: 19 U/L (ref 10–35)
Albumin: 4.8 g/dL (ref 3.6–5.1)
Alkaline phosphatase (APISO): 78 U/L (ref 35–144)
BUN: 9 mg/dL (ref 7–25)
CO2: 30 mmol/L (ref 20–32)
Calcium: 9.5 mg/dL (ref 8.6–10.3)
Chloride: 104 mmol/L (ref 98–110)
Creat: 0.79 mg/dL (ref 0.70–1.25)
GFR, Est African American: 111 mL/min/{1.73_m2} (ref >=60)
GFR, Est Non African American: 96 mL/min/{1.73_m2} (ref >=60)
Globulin: 2.7 g/dL (calc) (ref 1.9–3.7)
Glucose, Bld: 83 mg/dL (ref 65–99)
Potassium: 3.8 mmol/L (ref 3.5–5.3)
Sodium: 141 mmol/L (ref 135–146)
Total Bilirubin: 0.2 mg/dL (ref 0.2–1.2)
Total Protein: 7.5 g/dL (ref 6.1–8.1)

## 2019-07-06 LAB — HIV ANTIBODY (ROUTINE TESTING W REFLEX): HIV 1&2 Ab, 4th Generation: NONREACTIVE

## 2019-07-06 NOTE — Assessment & Plan Note (Signed)
James Barry continues to take his Truvada as prescribed with no adverse side effects or missed doses.  Overall feeling well today.  Check routine blood work.  Continue current dose of Truvada.  Discussed plan of care and will plan for follow-up in 3 months or sooner if needed.

## 2019-07-07 LAB — URINE CYTOLOGY ANCILLARY ONLY
Chlamydia: NEGATIVE
Comment: NEGATIVE
Comment: NORMAL
Neisseria Gonorrhea: NEGATIVE

## 2019-07-07 LAB — CYTOLOGY, (ORAL, ANAL, URETHRAL) ANCILLARY ONLY
Chlamydia: NEGATIVE
Chlamydia: NEGATIVE
Comment: NEGATIVE
Comment: NEGATIVE
Comment: NORMAL
Comment: NORMAL
Neisseria Gonorrhea: NEGATIVE
Neisseria Gonorrhea: NEGATIVE

## 2019-07-20 MED FILL — TRUVADA 200-300 MG TABS: 200-300 | 30 days supply | Qty: 30 | Fill #0

## 2019-07-21 ENCOUNTER — Ambulatory Visit (INDEPENDENT_AMBULATORY_CARE_PROVIDER_SITE_OTHER): Payer: 59 | Admitting: Family Medicine

## 2019-07-21 ENCOUNTER — Encounter: Payer: Self-pay | Admitting: Family Medicine

## 2019-07-21 ENCOUNTER — Other Ambulatory Visit: Payer: Self-pay

## 2019-07-21 DIAGNOSIS — S161XXA Strain of muscle, fascia and tendon at neck level, initial encounter: Secondary | ICD-10-CM

## 2019-07-21 DIAGNOSIS — S01511A Laceration without foreign body of lip, initial encounter: Secondary | ICD-10-CM

## 2019-07-21 MED ORDER — CYCLOBENZAPRINE HCL 10 MG PO TABS: 5.0000 mg | ORAL_TABLET | Freq: Every evening | ORAL | 0 refills | Status: DC | PRN
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
## 2019-07-30 ENCOUNTER — Other Ambulatory Visit: Payer: Self-pay

## 2019-07-30 ENCOUNTER — Ambulatory Visit (INDEPENDENT_AMBULATORY_CARE_PROVIDER_SITE_OTHER)
Admission: RE | Admit: 2019-07-30 | Discharge: 2019-07-30 | Disposition: A | Discharge status: Home / Self Care | Payer: 59 | Source: Ambulatory Visit | Attending: Primary Care | Admitting: Primary Care

## 2019-07-30 ENCOUNTER — Ambulatory Visit (INDEPENDENT_AMBULATORY_CARE_PROVIDER_SITE_OTHER): Payer: 59 | Admitting: Primary Care

## 2019-07-30 ENCOUNTER — Encounter: Payer: Self-pay | Admitting: Primary Care

## 2019-07-30 VITALS — BP 130/82 | HR 88 | Temp 96.6°F | Ht 67.0 in | Wt 158.5 lb

## 2019-07-30 DIAGNOSIS — M546 Pain in thoracic spine: Secondary | ICD-10-CM | POA: Diagnosis not present

## 2019-07-30 DIAGNOSIS — S161XXA Strain of muscle, fascia and tendon at neck level, initial encounter: Secondary | ICD-10-CM | POA: Diagnosis not present

## 2019-07-30 DIAGNOSIS — Q740 Other congenital malformations of upper limb(s), including shoulder girdle: Secondary | ICD-10-CM

## 2019-07-30 DIAGNOSIS — S299XXA Unspecified injury of thorax, initial encounter: Secondary | ICD-10-CM | POA: Diagnosis not present

## 2019-07-30 DIAGNOSIS — S4991XA Unspecified injury of right shoulder and upper arm, initial encounter: Secondary | ICD-10-CM | POA: Diagnosis not present

## 2019-07-30 DIAGNOSIS — R2231 Localized swelling, mass and lump, right upper limb: Secondary | ICD-10-CM | POA: Diagnosis not present

## 2019-07-30 HISTORY — DX: Pain in thoracic spine: M54.6

## 2019-07-30 HISTORY — DX: Other congenital malformations of upper limb(s), including shoulder girdle: Q74.0

## 2019-07-30 MED ORDER — CYCLOBENZAPRINE HCL 10 MG PO TABS: 5.0000 mg | ORAL_TABLET | Freq: Every evening | ORAL | 0 refills | Status: DC | PRN

## 2019-07-30 NOTE — Patient Instructions (Signed)
Complete xray(s) prior to leaving today. I will notify you of your results once received.  Continue the muscle relaxer, Ibuprofen, and Icy Hot.   You will be contacted regarding your referral to physical threapy.  Please let us know if you have not been contacted within a few days.  It was a pleasure to see you today!
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
## 2019-08-04 NOTE — Telephone Encounter (Signed)
Noted, thank you

## 2019-08-06 ENCOUNTER — Encounter: Payer: Self-pay | Admitting: Primary Care

## 2019-08-06 ENCOUNTER — Ambulatory Visit (INDEPENDENT_AMBULATORY_CARE_PROVIDER_SITE_OTHER): Payer: 59 | Admitting: Primary Care

## 2019-08-06 ENCOUNTER — Other Ambulatory Visit: Payer: Self-pay

## 2019-08-06 DIAGNOSIS — Q740 Other congenital malformations of upper limb(s), including shoulder girdle: Secondary | ICD-10-CM | POA: Diagnosis not present

## 2019-08-06 DIAGNOSIS — M546 Pain in thoracic spine: Secondary | ICD-10-CM | POA: Diagnosis not present

## 2019-08-06 NOTE — Assessment & Plan Note (Signed)
Benign on plain films. Does not appear suspicious on exam.

## 2019-08-06 NOTE — Assessment & Plan Note (Signed)
Improving with Flexeril, Icy Hot, and Ibuprofen. Also improving with walking and gradual increased activity. PT scheduled for late June 2021. Discussed to wean off of Flexeril when ready. Follow up PRN.  Plain films negative.
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
## 2019-08-17 DIAGNOSIS — I10 Essential (primary) hypertension: Secondary | ICD-10-CM

## 2019-08-17 DIAGNOSIS — Z125 Encounter for screening for malignant neoplasm of prostate: Secondary | ICD-10-CM

## 2019-08-17 MED FILL — TRUVADA 200-300 MG TABS: 200-300 | 30 days supply | Qty: 30 | Fill #1

## 2019-08-19 ENCOUNTER — Other Ambulatory Visit (INDEPENDENT_AMBULATORY_CARE_PROVIDER_SITE_OTHER): Payer: 59

## 2019-08-19 ENCOUNTER — Encounter: Payer: Self-pay | Admitting: Primary Care

## 2019-08-19 ENCOUNTER — Ambulatory Visit (INDEPENDENT_AMBULATORY_CARE_PROVIDER_SITE_OTHER): Payer: 59 | Admitting: Primary Care

## 2019-08-19 VITALS — BP 130/84 | HR 62 | Temp 96.1°F | Ht 67.0 in | Wt 158.8 lb

## 2019-08-19 DIAGNOSIS — M542 Cervicalgia: Secondary | ICD-10-CM | POA: Insufficient documentation

## 2019-08-19 DIAGNOSIS — I10 Essential (primary) hypertension: Secondary | ICD-10-CM | POA: Diagnosis not present

## 2019-08-19 DIAGNOSIS — M546 Pain in thoracic spine: Secondary | ICD-10-CM | POA: Diagnosis not present

## 2019-08-19 DIAGNOSIS — Z125 Encounter for screening for malignant neoplasm of prostate: Secondary | ICD-10-CM | POA: Diagnosis not present

## 2019-08-19 DIAGNOSIS — R300 Dysuria: Secondary | ICD-10-CM

## 2019-08-19 HISTORY — DX: Cervicalgia: M54.2

## 2019-08-19 LAB — LIPID PANEL
Cholesterol: 170 mg/dL (ref 0–200)
HDL: 38.6 mg/dL — ABNORMAL LOW (ref >39.00)
LDL Cholesterol: 100 mg/dL — ABNORMAL HIGH (ref 0–99)
NonHDL: 131.07
Total CHOL/HDL Ratio: 4
Triglycerides: 156 mg/dL — ABNORMAL HIGH (ref 0.0–149.0)
VLDL: 31.2 mg/dL (ref 0.0–40.0)

## 2019-08-19 LAB — PSA: PSA: 2.24 ng/mL (ref 0.10–4.00)
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
## 2019-08-19 NOTE — Patient Instructions (Signed)
Be sure to get up every 30-60 minutes to stretch or walk.  Gradually increase walking as tolerated.   Use the cyclobenzaprine up to three times daily as needed. Continue Tylenol and Ibuprofen, heat/ice.  It was a pleasure to see you today!
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
## 2019-08-20 ENCOUNTER — Other Ambulatory Visit: Payer: Self-pay

## 2019-08-20 ENCOUNTER — Other Ambulatory Visit: Payer: 59

## 2019-08-20 ENCOUNTER — Other Ambulatory Visit (INDEPENDENT_AMBULATORY_CARE_PROVIDER_SITE_OTHER): Payer: 59

## 2019-08-20 ENCOUNTER — Other Ambulatory Visit: Payer: Self-pay | Admitting: Primary Care

## 2019-08-20 DIAGNOSIS — R300 Dysuria: Secondary | ICD-10-CM | POA: Diagnosis not present

## 2019-08-20 LAB — POC URINALSYSI DIPSTICK (AUTOMATED)
Bilirubin, UA: NEGATIVE
Blood, UA: NEGATIVE
Glucose, UA: NEGATIVE
Leukocytes, UA: NEGATIVE
Nitrite, UA: NEGATIVE
Protein, UA: POSITIVE — AB
Spec Grav, UA: 1.02 (ref 1.010–1.025)
Urobilinogen, UA: 0.2 E.U./dL
pH, UA: 6 (ref 5.0–8.0)

## 2019-08-20 NOTE — Telephone Encounter (Signed)
James Barry.

## 2019-08-21 LAB — URINE CULTURE
MICRO NUMBER:: 10554498
Result:: NO GROWTH
SPECIMEN QUALITY:: ADEQUATE

## 2019-08-23 LAB — TRICHOMONAS VAGINALIS RNA, QL,MALES: Trichomonas vaginalis RNA: NOT DETECTED

## 2019-08-23 LAB — C. TRACHOMATIS/N. GONORRHOEAE RNA
C. trachomatis RNA, TMA: NOT DETECTED
N. gonorrhoeae RNA, TMA: NOT DETECTED

## 2019-08-24 DIAGNOSIS — S335XXA Sprain of ligaments of lumbar spine, initial encounter: Secondary | ICD-10-CM | POA: Diagnosis not present

## 2019-08-24 DIAGNOSIS — S134XXA Sprain of ligaments of cervical spine, initial encounter: Secondary | ICD-10-CM | POA: Diagnosis not present

## 2019-08-26 ENCOUNTER — Encounter: Payer: Self-pay | Admitting: Primary Care

## 2019-08-26 ENCOUNTER — Other Ambulatory Visit: Payer: Self-pay

## 2019-08-26 ENCOUNTER — Ambulatory Visit: Payer: 59 | Admitting: Primary Care

## 2019-08-26 VITALS — BP 130/82 | HR 65 | Temp 96.5°F | Ht 67.0 in | Wt 157.0 lb

## 2019-08-26 DIAGNOSIS — Z0000 Encounter for general adult medical examination without abnormal findings: Secondary | ICD-10-CM | POA: Diagnosis not present

## 2019-08-26 DIAGNOSIS — Z23 Encounter for immunization: Secondary | ICD-10-CM | POA: Diagnosis not present

## 2019-08-26 DIAGNOSIS — N529 Male erectile dysfunction, unspecified: Secondary | ICD-10-CM | POA: Diagnosis not present

## 2019-08-26 DIAGNOSIS — I10 Essential (primary) hypertension: Secondary | ICD-10-CM | POA: Diagnosis not present

## 2019-08-26 DIAGNOSIS — E7849 Other hyperlipidemia: Secondary | ICD-10-CM

## 2019-08-26 DIAGNOSIS — Z79899 Other long term (current) drug therapy: Secondary | ICD-10-CM

## 2019-08-26 DIAGNOSIS — R351 Nocturia: Secondary | ICD-10-CM

## 2019-08-26 DIAGNOSIS — K219 Gastro-esophageal reflux disease without esophagitis: Secondary | ICD-10-CM

## 2019-08-26 DIAGNOSIS — M542 Cervicalgia: Secondary | ICD-10-CM

## 2019-08-26 DIAGNOSIS — R39198 Other difficulties with micturition: Secondary | ICD-10-CM

## 2019-08-26 DIAGNOSIS — M546 Pain in thoracic spine: Secondary | ICD-10-CM

## 2019-08-26 MED ORDER — TAMSULOSIN HCL 0.4 MG PO CAPS: 0.4000 mg | ORAL_CAPSULE | Freq: Every day | ORAL | 0 refills | Status: DC

## 2019-08-26 NOTE — Assessment & Plan Note (Signed)
Well controlled on Nexium 40 mg, also taking either omeprazole or Pepcid in the afternoon.   He will send me a message via my chart with the name of the medication he's taking in the afternoon.

## 2019-08-26 NOTE — Addendum Note (Signed)
Addended by: Jacqualin Combes on: 08/26/2019 10:22 AM   Modules accepted: Orders

## 2019-08-26 NOTE — Assessment & Plan Note (Signed)
Well controlled without medications. Continue to monitor.

## 2019-08-26 NOTE — Progress Notes (Signed)
Subjective:    Patient ID: James Barry, male    DOB: 25-Apr-1955, 64 y.o.   MRN: [id number]  HPI  This visit occurred during the SARS-CoV-2 public health emergency.  Safety protocols were in place, including screening questions prior to the visit, additional usage of staff PPE, and extensive cleaning of exam room while observing appropriate contact time as indicated for disinfecting solutions.   James Barry is a 64 year old male who presents today for complete physical.  Immunizations: -Tetanus: Completed in 2011 -Influenza: Completed last season  -Shingles: Completed series -Covid-19: Completed series  Diet: He endorses a healthy diet.  Exercise: He is walking some during the evenings.  Eye exam: Completed in 2021, due soon.  Dental exam: Completes regularly   Colonoscopy: Completed in 2019, due in 2024 PSA: 2.24 Hep C Screen: Negative   BP Readings from Last 3 Encounters:  08/26/19 130/82  08/19/19 130/84  08/06/19 130/80     Review of Systems  Constitutional: Negative for unexpected weight change.  HENT: Negative for rhinorrhea.   Respiratory: Negative for cough and shortness of breath.   Cardiovascular: Negative for chest pain.  Gastrointestinal: Negative for constipation and diarrhea.  Genitourinary: Positive for difficulty urinating.       Difficulty initiating urinary stream during the night, also with nocturia 1-3 times nightly.   Musculoskeletal: Positive for arthralgias, back pain and myalgias.  Skin: Negative for rash.  Allergic/Immunologic: Negative for environmental allergies.  Neurological: Negative for dizziness, numbness and headaches.  Psychiatric/Behavioral: The patient is not nervous/anxious.        Past Medical History:  Diagnosis Date  . Diverticulosis   . Erectile dysfunction   . GERD (gastroesophageal reflux disease)   . Hyperlipidemia   . Vertigo      Social History   Socioeconomic History  . Marital status: Married    Spouse  name: Not on file  . Number of children: Not on file  . Years of education: Not on file  . Highest education level: Not on file  Occupational History  . Not on file  Tobacco Use  . Smoking status: Never Smoker  . Smokeless tobacco: Never Used  Substance and Sexual Activity  . Alcohol use: Yes    Alcohol/week: 0.0 standard drinks    Comment: social  . Drug use: No  . Sexual activity: Yes    Partners: Male    Comment: offered condoms  Other Topics Concern  . Not on file  Social History Narrative   Married.   No children.   Retired.   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   Social Determinants of Health   Financial Resource Strain:   . Difficulty of Paying Living Expenses:   Food Insecurity:   . Worried About Charity fundraiser in the Last Year:   . Arboriculturist in the Last Year:   Transportation Needs:   . Film/video editor (Medical):   Marland Kitchen Lack of Transportation (Non-Medical):   Physical Activity:   . Days of Exercise per Week:   . Minutes of Exercise per Session:   Stress:   . Feeling of Stress :   Social Connections:   . Frequency of Communication with Friends and Family:   . Frequency of Social Gatherings with Friends and Family:   . Attends Religious Services:   . Active Member of Clubs or Organizations:   . Attends Archivist Meetings:   .  Marital Status:   Intimate Partner Violence:   . Fear of Current or Ex-Partner:   . Emotionally Abused:   Marland Kitchen Physically Abused:   . Sexually Abused:     Past Surgical History:  Procedure Laterality Date  . COLONOSCOPY  2013   cleared for 5 yrs- Dr @ [facility] N[facility])    Family History  Problem Relation Age of Onset  . Breast cancer Mother   . Lung cancer Father   . Heart disease Father   . Hypertension Father   . Arthritis Maternal Grandmother     Allergies  Allergen Reactions  . Buspirone   . Cephalexin     Current Outpatient Medications on  File Prior to Visit  Medication Sig Dispense Refill  . acetaminophen (TYLENOL) 650 MG CR tablet Take 650 mg by mouth every 8 (eight) hours as needed for pain.    . cyclobenzaprine (FLEXERIL) 10 MG tablet Take 0.5-1 tablets (5-10 mg total) by mouth at bedtime as needed for muscle spasms. 20 tablet 0  . emtricitabine-tenofovir (TRUVADA) 200-300 MG tablet Take 1 tablet by mouth daily. 30 tablet 3  . esomeprazole (NEXIUM) 40 MG capsule TAKE 1 CAPSULE BY MOUTH EVERY DAY 90 capsule 1  . FIBER PO Take by mouth.    . Multiple Vitamin (MULTIVITAMIN) capsule Take 1 capsule by mouth daily.    . sildenafil (VIAGRA) 100 MG tablet Take 1 tablet (100 mg total) by mouth as needed for erectile dysfunction. 90 tablet 0   No current facility-administered medications on file prior to visit.    BP 130/82   Pulse 65   Temp (!) 96.5 F (35.8 C) (Temporal)   Ht 5\' 7"  (1.702 m)   Wt 157 lb (71.2 kg)   SpO2 98%   BMI 24.59 kg/m    Objective:   Physical Exam  Constitutional: He is oriented to person, place, and time.  HENT:  Right Ear: Tympanic membrane and ear canal normal.  Left Ear: Tympanic membrane and ear canal normal.  Eyes: Pupils are equal, round, and reactive to light.  Cardiovascular: Normal rate and regular rhythm.  Respiratory: Effort normal and breath sounds normal.  GI: Soft. Bowel sounds are normal. There is no abdominal tenderness.  Musculoskeletal:        General: Normal range of motion.     Cervical back: Neck supple.  Neurological: He is alert and oriented to person, place, and time. No cranial nerve deficit.  Reflex Scores:      Patellar reflexes are 2+ on the right side and 2+ on the left side. Skin: Skin is warm and dry.           Assessment & Plan:

## 2019-08-26 NOTE — Assessment & Plan Note (Signed)
Recent lipid panel stable. Continue to monitor.

## 2019-08-26 NOTE — Patient Instructions (Signed)
Start tamsulosin 0.4 mg once nightly for difficulty urinating and urinary frequency.  Please update me in a few weeks.  Start exercising. You should be getting 150 minutes of moderate intensity exercise weekly.  It's important to improve your diet by reducing consumption of fast food, fried food, processed snack foods, sugary drinks. Increase consumption of fresh vegetables and fruits, whole grains, water.  Ensure you are drinking 64 ounces of water daily.  Have a great time in the [place]! It was a pleasure to see you today!   Preventive Care 39-38 Years Old, Male Preventive care refers to lifestyle choices and visits with your health care provider that can promote health and wellness. This includes:  A yearly physical exam. This is also called an annual well check.  Regular dental and eye exams.  Immunizations.  Screening for certain conditions.  Healthy lifestyle choices, such as eating a healthy diet, getting regular exercise, not using drugs or products that contain nicotine and tobacco, and limiting alcohol use. What can I expect for my preventive care visit? Physical exam Your health care provider will check:  Height and weight. These may be used to calculate body mass index (BMI), which is a measurement that tells if you are at a healthy weight.  Heart rate and blood pressure.  Your skin for abnormal spots. Counseling Your health care provider may ask you questions about:  Alcohol, tobacco, and drug use.  Emotional well-being.  Home and relationship well-being.  Sexual activity.  Eating habits.  Work and work Statistician. What immunizations do I need?  Influenza (flu) vaccine  This is recommended every year. Tetanus, diphtheria, and pertussis (Tdap) vaccine  You may need a Td booster every 10 years. Varicella (chickenpox) vaccine  You may need this vaccine if you have not already been vaccinated. Zoster (shingles) vaccine  You may need this after age  38. Measles, mumps, and rubella (MMR) vaccine  You may need at least one dose of MMR if you were born in 1957 or later. You may also need a second dose. Pneumococcal conjugate (PCV13) vaccine  You may need this if you have certain conditions and were not previously vaccinated. Pneumococcal polysaccharide (PPSV23) vaccine  You may need one or two doses if you smoke cigarettes or if you have certain conditions. Meningococcal conjugate (MenACWY) vaccine  You may need this if you have certain conditions. Hepatitis A vaccine  You may need this if you have certain conditions or if you travel or work in places where you may be exposed to hepatitis A. Hepatitis B vaccine  You may need this if you have certain conditions or if you travel or work in places where you may be exposed to hepatitis B. Haemophilus influenzae type b (Hib) vaccine  You may need this if you have certain risk factors. Human papillomavirus (HPV) vaccine  If recommended by your health care provider, you may need three doses over 6 months. You may receive vaccines as individual doses or as more than one vaccine together in one shot (combination vaccines). Talk with your health care provider about the risks and benefits of combination vaccines. What tests do I need? Blood tests  Lipid and cholesterol levels. These may be checked every 5 years, or more frequently if you are over 71 years old.  Hepatitis C test.  Hepatitis B test. Screening  Lung cancer screening. You may have this screening every year starting at age 57 if you have a 30-pack-year history of smoking and currently smoke  or have quit within the past 15 years.  Prostate cancer screening. Recommendations will vary depending on your family history and other risks.  Colorectal cancer screening. All adults should have this screening starting at age 64 and continuing until age 63. Your health care provider may recommend screening at age 56 if you are at  increased risk. You will have tests every 1-10 years, depending on your results and the type of screening test.  Diabetes screening. This is done by checking your blood sugar (glucose) after you have not eaten for a while (fasting). You may have this done every 1-3 years.  Sexually transmitted disease (STD) testing. Follow these instructions at home: Eating and drinking  Eat a diet that includes fresh fruits and vegetables, whole grains, lean protein, and low-fat dairy products.  Take vitamin and mineral supplements as recommended by your health care provider.  Do not drink alcohol if your health care provider tells you not to drink.  If you drink alcohol: ? Limit how much you have to 0-2 drinks a day. ? Be aware of how much alcohol is in your drink. In the [place] one drink equals one 12 oz bottle of beer (355 mL), one 5 oz glass of wine (148 mL), or one 1 oz glass of hard liquor (44 mL). Lifestyle  Take daily care of your teeth and gums.  Stay active. Exercise for at least 30 minutes on 5 or more days each week.  Do not use any products that contain nicotine or tobacco, such as cigarettes, e-cigarettes, and chewing tobacco. If you need help quitting, ask your health care provider.  If you are sexually active, practice safe sex. Use a condom or other form of protection to prevent STIs (sexually transmitted infections).  Talk with your health care provider about taking a low-dose aspirin every day starting at age 57. What's next?  Go to your health care provider once a year for a well check visit.  Ask your health care provider how often you should have your eyes and teeth checked.  Stay up to date on all vaccines. This information is not intended to replace advice given to you by your health care provider. Make sure you discuss any questions you have with your health care provider. Document Revised: 02/26/2018 Document Reviewed: 02/26/2018 Elsevier Patient Education  2020  [employer].

## 2019-08-26 NOTE — Assessment & Plan Note (Signed)
Tetanus due, provided today. Other immunizations UTD. Colonoscopy UTD, due in 2024. PSA UTD. Encouraged a healthy diet, regular exercise. Exam today unremarkable. Labs reviewed.

## 2019-08-26 NOTE — Assessment & Plan Note (Signed)
Gradually improving, following with chiropractor. Continue PRN cyclobenzaprine.

## 2019-08-26 NOTE — Assessment & Plan Note (Signed)
Compliant, following with infectious disease.  Continue Truvada.

## 2019-08-26 NOTE — Assessment & Plan Note (Signed)
Doing well on sildenafil, continue same.
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
## 2019-08-26 NOTE — Assessment & Plan Note (Signed)
Chronic, now with difficulty urinating.  He is willing to trial Flomax for symptoms. Recent PSA unremarkable.   Rx for Flomax sent to pharmacy. He will update.

## 2019-08-29 DIAGNOSIS — R39198 Other difficulties with micturition: Secondary | ICD-10-CM

## 2019-08-29 DIAGNOSIS — R351 Nocturia: Secondary | ICD-10-CM

## 2019-08-30 ENCOUNTER — Other Ambulatory Visit: Payer: Self-pay | Admitting: Primary Care

## 2019-08-30 MED ORDER — TAMSULOSIN HCL 0.4 MG PO CAPS: 0.4000 mg | ORAL_CAPSULE | Freq: Every day | ORAL | 3 refills | Status: DC

## 2019-09-06 ENCOUNTER — Other Ambulatory Visit: Payer: Self-pay | Admitting: Primary Care

## 2019-09-06 DIAGNOSIS — K219 Gastro-esophageal reflux disease without esophagitis: Secondary | ICD-10-CM

## 2019-09-08 MED FILL — TRUVADA 200-300 MG TABS: 200-300 | 30 days supply | Qty: 30 | Fill #2

## 2019-09-08 MED FILL — TAMSULOSIN HCL 0.4 MG CAP: 0.4 | 90 days supply | Qty: 90 | Fill #0

## 2019-09-09 MED FILL — ESOMEPRAZOLE MAG DR 40 MG C: 40 | 90 days supply | Qty: 90 | Fill #0

## 2019-09-14 ENCOUNTER — Ambulatory Visit: Payer: 59 | Admitting: Physical Therapy

## 2019-09-27 ENCOUNTER — Encounter: Payer: Self-pay | Admitting: Primary Care

## 2019-09-27 ENCOUNTER — Other Ambulatory Visit: Payer: Self-pay

## 2019-09-27 ENCOUNTER — Ambulatory Visit: Payer: 59 | Admitting: Primary Care

## 2019-09-27 ENCOUNTER — Encounter: Payer: 59 | Admitting: Physical Therapy

## 2019-09-27 DIAGNOSIS — T148XXA Other injury of unspecified body region, initial encounter: Secondary | ICD-10-CM | POA: Insufficient documentation

## 2019-09-27 NOTE — Patient Instructions (Signed)
Continue ice and ibuprofen as needed.  Remain active during the day, elevate at night.   It was a pleasure to see you today!

## 2019-09-27 NOTE — Assessment & Plan Note (Signed)
Noted to right lower extremity today, did sustain mild injury about 10 days ago. Improving.   Exam today stable, appears to be healing. Discussed that it may take several weeks to completely resolve.  Continue ice and ibuprofen as needed. He will update with any changes.

## 2019-09-27 NOTE — Telephone Encounter (Signed)
Per appt notes pt already has appt today at 10 AM with Gentry Fitz NP.

## 2019-09-27 NOTE — Telephone Encounter (Signed)
[place] Night - Client Nonclinical Telephone Record [facility] Client [place] Night - Client Client Site [place] Physician Alma Friendly - NP Contact Type Call Who Is Calling Patient / Member / Family / Caregiver Caller Name [place] Phone Number [phone] Patient Name James Barry Patient DOB 03-Oct-1955 Call Type Message Only Information Provided Reason for Call Request to Schedule Office Appointment Initial Comment Caller states he accidentally hit his right leg. It is swollen and he thinks he hit a vein. he wants an appt. Additional Comment Provided office hours and advised to call back. Non symptomatic. Disp. Time Disposition Final User 09/27/2019 8:05:49 AM General Information Provided Yes Ovid Curd, [place] Call Closed By: Rolan Bucco Transaction Date/Time: 09/27/2019 8:03:01 AM (ET)

## 2019-09-27 NOTE — Progress Notes (Signed)
Subjective:    Patient ID: James Barry, male    DOB: [birth year], 64 y.o.   MRN: [id number]  HPI  This visit occurred during the SARS-CoV-2 public health emergency.  Safety protocols were in place, including screening questions prior to the visit, additional usage of staff PPE, and extensive cleaning of exam room while observing appropriate contact time as indicated for disinfecting solutions.   James Barry is a 64 year old male who presents today with a chief complaint of lower extremity pain.  About ten days ago he was on vacation in the [place], was in the process of getting into a beach chair when he hit the medial side of his right lower leg just over the vein. The chair was steel, he hit pretty hard. He immediately noticed moderate swelling with pain on palpation. No bleeding or open skin. He happened to run into a cardiologist who evaluated him and told him not to worry. He was ambulatory immediately.  Since the incident he's noticed a reduction in swelling. He did develop bruising around the site which has gradually improved.  He only notices tenderness upon palpation and sometimes during the night. He's been applying ice and taking Ibuprofen.    Review of Systems  Respiratory: Negative for shortness of breath.   Cardiovascular: Negative for chest pain and palpitations.  Skin: Positive for color change.       Hematoma        Past Medical History:  Diagnosis Date  . Diverticulosis   . Erectile dysfunction   . GERD (gastroesophageal reflux disease)   . Hyperlipidemia   . Vertigo      Social History   Socioeconomic History  . Marital status: Married    Spouse name: Not on file  . Number of children: Not on file  . Years of education: Not on file  . Highest education level: Not on file  Occupational History  . Not on file  Tobacco Use  . Smoking status: Never Smoker  . Smokeless tobacco: Never Used  Substance and Sexual Activity  . Alcohol use: Yes     Alcohol/week: 0.0 standard drinks    Comment: social  . Drug use: No  . Sexual activity: Yes    Partners: Male    Comment: offered condoms  Other Topics Concern  . Not on file  Social History Narrative   Married.   No children.   Retired.   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   Social Determinants of Health   Financial Resource Strain:   . Difficulty of Paying Living Expenses:   Food Insecurity:   . Worried About Charity fundraiser in the Last Year:   . Arboriculturist in the Last Year:   Transportation Needs:   . Film/video editor (Medical):   Marland Kitchen Lack of Transportation (Non-Medical):   Physical Activity:   . Days of Exercise per Week:   . Minutes of Exercise per Session:   Stress:   . Feeling of Stress :   Social Connections:   . Frequency of Communication with Friends and Family:   . Frequency of Social Gatherings with Friends and Family:   . Attends Religious Services:   . Active Member of Clubs or Organizations:   . Attends Archivist Meetings:   Marland Kitchen Marital Status:   Intimate Partner Violence:   . Fear of Current or Ex-Partner:   . Emotionally Abused:   .  Physically Abused:   . Sexually Abused:     Past Surgical History:  Procedure Laterality Date  . COLONOSCOPY  2013   cleared for 5 yrs- Dr @ [facility] Narda Amber)    Family History  Problem Relation Age of Onset  . Breast cancer Mother   . Lung cancer Father   . Heart disease Father   . Hypertension Father   . Arthritis Maternal Grandmother     Allergies  Allergen Reactions  . Buspirone   . Cephalexin     Current Outpatient Medications on File Prior to Visit  Medication Sig Dispense Refill  . acetaminophen (TYLENOL) 650 MG CR tablet Take 650 mg by mouth every 8 (eight) hours as needed for pain.    . cyclobenzaprine (FLEXERIL) 10 MG tablet Take 0.5-1 tablets (5-10 mg total) by mouth at bedtime as needed for muscle spasms. 20 tablet 0  .  emtricitabine-tenofovir (TRUVADA) 200-300 MG tablet Take 1 tablet by mouth daily. 30 tablet 3  . esomeprazole (NEXIUM) 40 MG capsule TAKE 1 CAPSULE BY MOUTH ONCE DAILY 90 capsule 1  . famotidine (PEPCID) 20 MG tablet Take 20 mg by mouth daily.    Marland Kitchen FIBER PO Take by mouth.    . Multiple Vitamin (MULTIVITAMIN) capsule Take 1 capsule by mouth daily.    . sildenafil (VIAGRA) 100 MG tablet Take 1 tablet (100 mg total) by mouth as needed for erectile dysfunction. 90 tablet 0  . tamsulosin (FLOMAX) 0.4 MG CAPS capsule Take 1 capsule (0.4 mg total) by mouth daily after supper. For urinary symptoms. 90 capsule 3   No current facility-administered medications on file prior to visit.    BP 126/82   Pulse 78   Temp (!) 96.2 F (35.7 C) (Temporal)   Ht 5\' 7"  (1.702 m)   Wt 157 lb 8 oz (71.4 kg)   SpO2 98%   BMI 24.67 kg/m    Objective:   Physical Exam Cardiovascular:     Rate and Rhythm: Normal rate and regular rhythm.     Pulses:          Dorsalis pedis pulses are 2+ on the right side.       Posterior tibial pulses are 2+ on the right side.  Pulmonary:     Effort: Pulmonary effort is normal.     Breath sounds: Normal breath sounds.  Skin:    General: Skin is warm and dry.     Findings: Bruising present.     Comments: Small hematoma noted to right medial lower extremity proximal to ankle. Old, faint bruising to site.   Neurological:     Mental Status: He is alert.            Assessment & Plan:

## 2019-09-29 ENCOUNTER — Encounter: Payer: 59 | Admitting: Physical Therapy

## 2019-10-04 ENCOUNTER — Other Ambulatory Visit (HOSPITAL_COMMUNITY)
Admission: RE | Admit: 2019-10-04 | Discharge: 2019-10-04 | Disposition: A | Discharge status: Home / Self Care | Payer: 59 | Source: Ambulatory Visit | Attending: Family | Admitting: Family

## 2019-10-04 ENCOUNTER — Other Ambulatory Visit: Payer: Self-pay

## 2019-10-04 ENCOUNTER — Ambulatory Visit: Payer: 59 | Admitting: Family

## 2019-10-04 ENCOUNTER — Other Ambulatory Visit: Payer: Self-pay | Admitting: Family

## 2019-10-04 ENCOUNTER — Encounter: Payer: Self-pay | Admitting: Family

## 2019-10-04 ENCOUNTER — Encounter: Payer: 59 | Admitting: Physical Therapy

## 2019-10-04 VITALS — BP 141/91 | HR 81 | Temp 98.3°F | Wt 158.0 lb

## 2019-10-04 DIAGNOSIS — Z79899 Other long term (current) drug therapy: Secondary | ICD-10-CM | POA: Diagnosis not present

## 2019-10-04 DIAGNOSIS — Z7252 High risk homosexual behavior: Secondary | ICD-10-CM

## 2019-10-04 MED ORDER — EMTRICITABINE-TENOFOVIR DF 200-300 MG PO TABS: 1.0000 | ORAL_TABLET | Freq: Every day | ORAL | 3 refills | Status: DC

## 2019-10-04 NOTE — Assessment & Plan Note (Signed)
James Barry continues to have good adherence and tolerance to his prep regimen of Truvada.  Reviewed previous lab work and discussed the plan of care.  Check lab work today including STI testing, HIV testing, and hepatic function and renal function.  Continue current dose of Truvada.  Follow-up in 3 months or sooner if needed.

## 2019-10-04 NOTE — Progress Notes (Signed)
Subjective:    Patient ID: James Barry, male    DOB: September 06, 1955, 64 y.o.   MRN: [id number]  Chief Complaint  Patient presents with  . Follow-up    PREP     HPI:  NATHIAN Barry is a 64 y.o. male on PrEP was last seen in the office on 07/05/19 with good adherence and tolerance to his PrEP regimen of Truvada. Previous HIV and STI testing were negative. Here today for routine follow up.  [name] has continued to take his Truvada daily with no adverse side effects. Overall feeling well today with no new concerns or complaints. Denies fevers, chills, night sweats, headaches, changes in vision, neck pain/stiffness, nausea, diarrhea, vomiting, lesions or rashes.    Allergies  Allergen Reactions  . Buspirone   . Cephalexin       Outpatient Medications Prior to Visit  Medication Sig Dispense Refill  . acetaminophen (TYLENOL) 650 MG CR tablet Take 650 mg by mouth every 8 (eight) hours as needed for pain.    . cyclobenzaprine (FLEXERIL) 10 MG tablet Take 0.5-1 tablets (5-10 mg total) by mouth at bedtime as needed for muscle spasms. 20 tablet 0  . esomeprazole (NEXIUM) 40 MG capsule TAKE 1 CAPSULE BY MOUTH ONCE DAILY 90 capsule 1  . famotidine (PEPCID) 20 MG tablet Take 20 mg by mouth daily.    Marland Kitchen FIBER PO Take by mouth.    . Multiple Vitamin (MULTIVITAMIN) capsule Take 1 capsule by mouth daily.    . sildenafil (VIAGRA) 100 MG tablet Take 1 tablet (100 mg total) by mouth as needed for erectile dysfunction. 90 tablet 0  . tamsulosin (FLOMAX) 0.4 MG CAPS capsule Take 1 capsule (0.4 mg total) by mouth daily after supper. For urinary symptoms. 90 capsule 3  . emtricitabine-tenofovir (TRUVADA) 200-300 MG tablet Take 1 tablet by mouth daily. 30 tablet 3   No facility-administered medications prior to visit.     Past Medical History:  Diagnosis Date  . Diverticulosis   . Erectile dysfunction   . GERD (gastroesophageal reflux disease)   . Hyperlipidemia   . Vertigo      Past  Surgical History:  Procedure Laterality Date  . COLONOSCOPY  2013   cleared for 5 yrs- Dr @ [facility] Narda Amber)       Review of Systems  Constitutional: Negative for appetite change, chills, fatigue, fever and unexpected weight change.  Eyes: Negative for visual disturbance.  Respiratory: Negative for cough, chest tightness, shortness of breath and wheezing.   Cardiovascular: Negative for chest pain and leg swelling.  Gastrointestinal: Negative for abdominal pain, constipation, diarrhea, nausea and vomiting.  Genitourinary: Negative for dysuria, flank pain, frequency, genital sores, hematuria and urgency.  Skin: Negative for rash.  Allergic/Immunologic: Negative for immunocompromised state.  Neurological: Negative for dizziness and headaches.      Objective:    BP (!) 141/91   Pulse 81   Temp 98.3 F (36.8 C) (Oral)   Wt 158 lb (71.7 kg)   BMI 24.75 kg/m  Nursing note and vital signs reviewed.  Physical Exam Constitutional:      General: He is not in acute distress.    Appearance: He is well-developed.  Eyes:     Conjunctiva/sclera: Conjunctivae normal.  Cardiovascular:     Rate and Rhythm: Normal rate and regular rhythm.     Heart sounds: Normal heart sounds. No murmur heard.  No friction rub. No gallop.   Pulmonary:     Effort:  Pulmonary effort is normal. No respiratory distress.     Breath sounds: Normal breath sounds. No wheezing or rales.  Chest:     Chest wall: No tenderness.  Abdominal:     General: Bowel sounds are normal.     Palpations: Abdomen is soft.     Tenderness: There is no abdominal tenderness.  Musculoskeletal:     Cervical back: Neck supple.  Lymphadenopathy:     Cervical: No cervical adenopathy.  Skin:    General: Skin is warm and dry.     Findings: No rash.  Neurological:     Mental Status: He is alert and oriented to person, place, and time.  Psychiatric:        Behavior: Behavior normal.        Thought Content: Thought  content normal.        Judgment: Judgment normal.      Depression screen [facility] 2/9 10/04/2019 03/25/2019 12/24/2018 09/23/2018 07/06/2018  Decreased Interest 0 0 0 0 0  Down, Depressed, Hopeless 0 0 0 0 0  PHQ - 2 Score 0 0 0 0 0       Assessment & Plan:    Patient Active Problem List   Diagnosis Date Noted  . Hematoma 09/27/2019  . Acute neck pain 08/19/2019  . Acute thoracic back pain 07/30/2019  . Clavicular asymmetry 07/30/2019  . Chest discomfort 11/06/2018  . 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 Groin discomfort 09/21/2018  . Preventative health care 08/24/2018  . On pre-exposure prophylaxis for HIV 12/17/2017  . Chronic left shoulder pain 08/13/2017  . Nocturia 08/13/2017  . Exposure to HIV 06/06/2017  . Erectile dysfunction 06/28/2015  . 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 Routine screening for STI (sexually transmitted infection) 07/14/2014  . Abnormal LFTs 07/14/2014  . Essential (primary) hypertension 07/14/2014  . Acid reflux 07/14/2014     Problem List Items Addressed This Visit      Other   On pre-exposure prophylaxis for HIV - Primary    Mr. Can continues to have good adherence and tolerance to his prep regimen of Truvada.  Reviewed previous lab work and discussed the plan of care.  Check lab work today including STI testing, HIV testing, and hepatic function and renal function.  Continue current dose of Truvada.  Follow-up in 3 months or sooner if needed.      Relevant Medications   emtricitabine-tenofovir (TRUVADA) 200-300 MG tablet   Other Relevant Orders   COMPLETE METABOLIC PANEL WITH GFR   RPR   Cytology (oral, anal, urethral) ancillary only   Cytology (oral, anal, urethral) ancillary only   Urine cytology ancillary only([place])   HIV antibody (with reflex)   High risk homosexual behavior   Relevant Orders   COMPLETE METABOLIC PANEL WITH GFR   RPR   Cytology (oral, anal, urethral) ancillary only   Cytology  (oral, anal, urethral) ancillary only   Urine cytology ancillary only([place])   HIV antibody (with reflex)       I am having [name] maintain his multivitamin, FIBER PO, acetaminophen, sildenafil, cyclobenzaprine, famotidine, tamsulosin, esomeprazole, and emtricitabine-tenofovir.   Meds ordered this encounter  Medications  . emtricitabine-tenofovir (TRUVADA) 200-300 MG tablet    Sig: Take 1 tablet by mouth daily.    Dispense:  30 tablet    Refill:  3    Order Specific Question:   Supervising Provider    Answer:   Carlyle Basques [[id number]]     Follow-up:  Return in about 3 months (around 01/04/2020).   Terri Piedra, MSN, FNP-C Nurse Practitioner [facility] for Infectious Disease [place] number: [phone]

## 2019-10-04 NOTE — Patient Instructions (Signed)
Nice to see you.   Continue to take the Truvada as prescribed.  Refills will be sent to the pharmacy.  We will check your lab work today.  Plan for follow up in 3 months or sooner if needed.

## 2019-10-05 LAB — COMPLETE METABOLIC PANEL WITH GFR
AG Ratio: 1.7 (calc) (ref 1.0–2.5)
ALT: 23 U/L (ref 9–46)
AST: 19 U/L (ref 10–35)
Albumin: 4.6 g/dL (ref 3.6–5.1)
Alkaline phosphatase (APISO): 66 U/L (ref 35–144)
BUN: 13 mg/dL (ref 7–25)
CO2: 29 mmol/L (ref 20–32)
Calcium: 9.4 mg/dL (ref 8.6–10.3)
Chloride: 103 mmol/L (ref 98–110)
Creat: 0.96 mg/dL (ref 0.70–1.25)
GFR, Est African American: 96 mL/min/{1.73_m2} (ref >=60)
GFR, Est Non African American: 83 mL/min/{1.73_m2} (ref >=60)
Globulin: 2.7 g/dL (calc) (ref 1.9–3.7)
Glucose, Bld: 93 mg/dL (ref 65–99)
Potassium: 4.1 mmol/L (ref 3.5–5.3)
Sodium: 139 mmol/L (ref 135–146)
Total Bilirubin: 0.3 mg/dL (ref 0.2–1.2)
Total Protein: 7.3 g/dL (ref 6.1–8.1)

## 2019-10-05 LAB — RPR: RPR Ser Ql: NONREACTIVE

## 2019-10-05 LAB — HIV ANTIBODY (ROUTINE TESTING W REFLEX): HIV 1&2 Ab, 4th Generation: NONREACTIVE

## 2019-10-06 ENCOUNTER — Encounter: Payer: 59 | Admitting: Physical Therapy

## 2019-10-06 DIAGNOSIS — G548 Other nerve root and plexus disorders: Secondary | ICD-10-CM | POA: Diagnosis not present

## 2019-10-06 DIAGNOSIS — D2262 Melanocytic nevi of left upper limb, including shoulder: Secondary | ICD-10-CM | POA: Diagnosis not present

## 2019-10-06 LAB — CYTOLOGY, (ORAL, ANAL, URETHRAL) ANCILLARY ONLY
Chlamydia: NEGATIVE
Comment: NEGATIVE
Comment: NORMAL
Neisseria Gonorrhea: NEGATIVE

## 2019-10-06 LAB — URINE CYTOLOGY ANCILLARY ONLY
Chlamydia: NEGATIVE
Comment: NEGATIVE
Comment: NORMAL
Neisseria Gonorrhea: NEGATIVE

## 2019-10-08 LAB — MOLECULAR ANCILLARY ONLY
Chlamydia: NEGATIVE
Comment: NEGATIVE
Comment: NORMAL
Neisseria Gonorrhea: NEGATIVE

## 2019-10-11 ENCOUNTER — Encounter: Payer: 59 | Admitting: Physical Therapy

## 2019-10-12 MED FILL — EMTRICITABINE-TENOFOVIR DF: 200-300 | 30 days supply | Qty: 30 | Fill #3

## 2019-10-13 ENCOUNTER — Encounter: Payer: 59 | Admitting: Physical Therapy

## 2019-10-18 DIAGNOSIS — I87393 Chronic venous hypertension (idiopathic) with other complications of bilateral lower extremity: Secondary | ICD-10-CM | POA: Diagnosis not present

## 2019-10-18 DIAGNOSIS — S8011XA Contusion of right lower leg, initial encounter: Secondary | ICD-10-CM | POA: Diagnosis not present

## 2019-11-15 ENCOUNTER — Telehealth: Payer: Self-pay | Admitting: *Deleted

## 2019-11-15 ENCOUNTER — Other Ambulatory Visit: Payer: Self-pay | Admitting: Family

## 2019-11-15 DIAGNOSIS — I87393 Chronic venous hypertension (idiopathic) with other complications of bilateral lower extremity: Secondary | ICD-10-CM | POA: Diagnosis not present

## 2019-11-15 DIAGNOSIS — Z79899 Other long term (current) drug therapy: Secondary | ICD-10-CM

## 2019-11-15 DIAGNOSIS — S8011XA Contusion of right lower leg, initial encounter: Secondary | ICD-10-CM | POA: Diagnosis not present

## 2019-11-15 NOTE — Telephone Encounter (Signed)
Received request for truvada refill from [facility] outpatient pharmacy. Refill last sent 10/04/19 with 3 refills.  Per pharmacy, they may need a new prescription.  Pharmacy will call back. Landis Gandy, RN

## 2019-11-16 ENCOUNTER — Other Ambulatory Visit: Payer: Self-pay | Admitting: Internal Medicine

## 2019-11-16 DIAGNOSIS — Z79899 Other long term (current) drug therapy: Secondary | ICD-10-CM

## 2019-11-17 ENCOUNTER — Telehealth: Payer: Self-pay | Admitting: Primary Care

## 2019-11-17 MED FILL — EMTRICITABINE-TENOFOVIR DF: 200-300 | 30 days supply | Qty: 30 | Fill #0

## 2019-11-17 NOTE — Telephone Encounter (Signed)
I called patient to discuss a billing concern that he brought to my attention when he came into the office. I left a voicemail asking him to give me a call back.

## 2019-11-18 NOTE — Telephone Encounter (Signed)
Will forward to ID.

## 2019-11-18 NOTE — Telephone Encounter (Signed)
Should this be forwarded to Terri Piedra FNP ?

## 2019-11-29 DIAGNOSIS — I83813 Varicose veins of bilateral lower extremities with pain: Secondary | ICD-10-CM | POA: Diagnosis not present

## 2019-12-01 ENCOUNTER — Ambulatory Visit (INDEPENDENT_AMBULATORY_CARE_PROVIDER_SITE_OTHER): Payer: 59

## 2019-12-01 ENCOUNTER — Other Ambulatory Visit: Payer: Self-pay

## 2019-12-01 DIAGNOSIS — Z23 Encounter for immunization: Secondary | ICD-10-CM

## 2019-12-13 ENCOUNTER — Other Ambulatory Visit: Payer: Self-pay | Admitting: Primary Care

## 2019-12-13 DIAGNOSIS — N529 Male erectile dysfunction, unspecified: Secondary | ICD-10-CM

## 2019-12-13 MED FILL — EMTRICITABINE-TENOFOVIR DF: 200-300 | 30 days supply | Qty: 30 | Fill #1

## 2019-12-13 MED FILL — TAMSULOSIN HCL 0.4 MG CAP: 0.4 | 90 days supply | Qty: 90 | Fill #1

## 2019-12-14 MED FILL — ESOMEPRAZOLE MAG DR 40 MG C: 40 | 90 days supply | Qty: 90 | Fill #1

## 2019-12-17 ENCOUNTER — Other Ambulatory Visit: Payer: Self-pay | Admitting: Primary Care

## 2019-12-17 MED FILL — SILDENAFIL CITRATE 100 MG T: 100 | 90 days supply | Qty: 90 | Fill #0

## 2019-12-17 NOTE — Telephone Encounter (Signed)
LAST APPOINTMENT DATE: 08/26/2019   NEXT APPOINTMENT DATE: no f/u     LAST REFILL: 06/02/2019  QTY: #90 rf 0

## 2019-12-17 NOTE — Telephone Encounter (Signed)
Refills sent to pharmacy. 

## 2019-12-21 DIAGNOSIS — Z20822 Contact with and (suspected) exposure to covid-19: Secondary | ICD-10-CM | POA: Diagnosis not present

## 2019-12-28 ENCOUNTER — Ambulatory Visit: Payer: 59 | Admitting: Family

## 2020-01-17 MED FILL — EMTRICITABINE-TENOFOVIR DF: 200-300 | 30 days supply | Qty: 30 | Fill #2

## 2020-01-20 ENCOUNTER — Ambulatory Visit: Payer: 59 | Admitting: Family

## 2020-01-20 ENCOUNTER — Other Ambulatory Visit: Payer: Self-pay

## 2020-01-20 ENCOUNTER — Other Ambulatory Visit: Payer: Self-pay | Admitting: Family

## 2020-01-20 ENCOUNTER — Other Ambulatory Visit (HOSPITAL_COMMUNITY)
Admission: RE | Admit: 2020-01-20 | Discharge: 2020-01-20 | Disposition: A | Discharge status: Home / Self Care | Payer: 59 | Source: Ambulatory Visit | Attending: Family | Admitting: Family

## 2020-01-20 ENCOUNTER — Encounter: Payer: Self-pay | Admitting: Family

## 2020-01-20 VITALS — BP 122/82 | HR 79 | Temp 98.1°F | Ht 67.0 in | Wt 151.0 lb

## 2020-01-20 DIAGNOSIS — Z7252 High risk homosexual behavior: Secondary | ICD-10-CM

## 2020-01-20 DIAGNOSIS — Z79899 Other long term (current) drug therapy: Secondary | ICD-10-CM | POA: Insufficient documentation

## 2020-01-20 DIAGNOSIS — Z5181 Encounter for therapeutic drug level monitoring: Secondary | ICD-10-CM | POA: Diagnosis not present

## 2020-01-20 MED ORDER — EMTRICITABINE-TENOFOVIR AF 200-25 MG PO TABS: 1.0000 | ORAL_TABLET | Freq: Every day | ORAL | 4 refills | Status: DC

## 2020-01-20 NOTE — Assessment & Plan Note (Signed)
Mr. James Barry continues to have good adherence and tolerance to his prep regimen of Truvada.  Previous blood work all negative and within normal limits.  Check blood work today.  Discussed plan of care and will change to Descovy.  Discussed importance of safe sexual practice to reduce risk of STI.  Follow-up in 3 months or sooner if needed with lab work on the same day.

## 2020-01-20 NOTE — Patient Instructions (Signed)
Nice to see you.  We will check your blood work today.  Prescription for Descovy has been sent to the pharmacy  Plan for follow-up in 3 months or sooner if needed with lab work on the same day.  Have a great day and stay safe!  Have a great trip to [place]!

## 2020-01-20 NOTE — Progress Notes (Signed)
Subjective:    Patient ID: James Barry, male    DOB: 11/30/1955, 64 y.o.   MRN: [id number]  Chief Complaint  Patient presents with  . Follow-up    declined condoms      HPI:  James Barry is a 64 y.o. male on preexposure prophylaxis for HIV who was last seen in the office on 10/04/2019 with good adherence and tolerance to his prep regimen of Truvada.  Here today for routine follow-up.  James Barry continues to take his Truvada daily as prescribed with no adverse side effects or missed doses. Overall feeling well today with no new concerns or complaints.    Allergies  Allergen Reactions  . Buspirone   . Cephalexin       Outpatient Medications Prior to Visit  Medication Sig Dispense Refill  . acetaminophen (TYLENOL) 650 MG CR tablet Take 650 mg by mouth every 8 (eight) hours as needed for pain.    Marland Kitchen esomeprazole (NEXIUM) 40 MG capsule TAKE 1 CAPSULE BY MOUTH ONCE DAILY 90 capsule 1  . famotidine (PEPCID) 20 MG tablet Take 20 mg by mouth daily.    Marland Kitchen FIBER PO Take by mouth.    . Multiple Vitamin (MULTIVITAMIN) capsule Take 1 capsule by mouth daily.    . sildenafil (VIAGRA) 100 MG tablet TAKE 1 TABLET BY MOUTH AS NEEDED FOR ERECTILE DYSFUNCTION. 90 tablet 0  . tamsulosin (FLOMAX) 0.4 MG CAPS capsule Take 1 capsule (0.4 mg total) by mouth daily after supper. For urinary symptoms. 90 capsule 3  . emtricitabine-tenofovir (TRUVADA) 200-300 MG tablet Take 1 tablet by mouth daily. 30 tablet 3  . cyclobenzaprine (FLEXERIL) 10 MG tablet Take 0.5-1 tablets (5-10 mg total) by mouth at bedtime as needed for muscle spasms. 20 tablet 0   No facility-administered medications prior to visit.     Past Medical History:  Diagnosis Date  . Diverticulosis   . Erectile dysfunction   . GERD (gastroesophageal reflux disease)   . Hyperlipidemia   . Vertigo      Past Surgical History:  Procedure Laterality Date  . COLONOSCOPY  2013   cleared for 5 yrs- Dr @ [facility] Narda Amber)        Review of Systems  Constitutional: Negative for appetite change, chills, fatigue, fever and unexpected weight change.  Eyes: Negative for visual disturbance.  Respiratory: Negative for cough, chest tightness, shortness of breath and wheezing.   Cardiovascular: Negative for chest pain and leg swelling.  Gastrointestinal: Negative for abdominal pain, constipation, diarrhea, nausea and vomiting.  Genitourinary: Negative for dysuria, flank pain, frequency, genital sores, hematuria and urgency.  Skin: Negative for rash.  Allergic/Immunologic: Negative for immunocompromised state.  Neurological: Negative for dizziness and headaches.      Objective:    BP 122/82   Pulse 79   Temp 98.1 F (36.7 C) (Oral)   Ht 5\' 7"  (1.702 m)   Wt 151 lb (68.5 kg)   SpO2 98%   BMI 23.65 kg/m  Nursing note and vital signs reviewed.  Physical Exam Constitutional:      General: He is not in acute distress.    Appearance: He is well-developed.  Eyes:     Conjunctiva/sclera: Conjunctivae normal.  Cardiovascular:     Rate and Rhythm: Normal rate and regular rhythm.     Heart sounds: Normal heart sounds. No murmur heard.  No friction rub. No gallop.   Pulmonary:     Effort: Pulmonary effort is normal. No respiratory distress.  Breath sounds: Normal breath sounds. No wheezing or rales.  Chest:     Chest wall: No tenderness.  Abdominal:     General: Bowel sounds are normal.     Palpations: Abdomen is soft.     Tenderness: There is no abdominal tenderness.  Musculoskeletal:     Cervical back: Neck supple.  Lymphadenopathy:     Cervical: No cervical adenopathy.  Skin:    General: Skin is warm and dry.     Findings: No rash.  Neurological:     Mental Status: He is alert and oriented to person, place, and time.  Psychiatric:        Behavior: Behavior normal.        Thought Content: Thought content normal.        Judgment: Judgment normal.      Depression screen [facility] 2/9 01/20/2020  10/04/2019 03/25/2019 12/24/2018 09/23/2018  Decreased Interest 0 0 0 0 0  Down, Depressed, Hopeless 0 0 0 0 0  PHQ - 2 Score 0 0 0 0 0       Assessment & Plan:    Patient Active Problem List   Diagnosis Date Noted  . Hematoma 09/27/2019  . Acute neck pain 08/19/2019  . Acute thoracic back pain 07/30/2019  . Clavicular asymmetry 07/30/2019  . Chest discomfort 11/06/2018  . 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 Groin discomfort 09/21/2018  . Preventative health care 08/24/2018  . On pre-exposure prophylaxis for HIV 12/17/2017  . Chronic left shoulder pain 08/13/2017  . Nocturia 08/13/2017  . Exposure to HIV 06/06/2017  . Erectile dysfunction 06/28/2015  . 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 Routine screening for STI (sexually transmitted infection) 07/14/2014  . Abnormal LFTs 07/14/2014  . Essential (primary) hypertension 07/14/2014  . Acid reflux 07/14/2014     Problem List Items Addressed This Visit      Other   On pre-exposure prophylaxis for HIV - Primary    Mr. James Barry continues to have good adherence and tolerance to his prep regimen of Truvada.  Previous blood work all negative and within normal limits.  Check blood work today.  Discussed plan of care and will change to Descovy.  Discussed importance of safe sexual practice to reduce risk of STI.  Follow-up in 3 months or sooner if needed with lab work on the same day.      Relevant Orders   COMPLETE METABOLIC PANEL WITH GFR   RPR   Cytology (oral, anal, urethral) ancillary only   Cytology (oral, anal, urethral) ancillary only   HIV antibody (with reflex)   Urine cytology ancillary only([place])   High risk homosexual behavior   Relevant Orders   COMPLETE METABOLIC PANEL WITH GFR   RPR   Cytology (oral, anal, urethral) ancillary only   Cytology (oral, anal, urethral) ancillary only   HIV antibody (with reflex)   Urine cytology ancillary only([place])    Other Visit  Diagnoses    Therapeutic drug monitoring       Relevant Orders   COMPLETE METABOLIC PANEL WITH GFR   RPR   Cytology (oral, anal, urethral) ancillary only   Cytology (oral, anal, urethral) ancillary only   HIV antibody (with reflex)   Urine cytology ancillary only([place])       I have discontinued Annie Main D. Stouffer's emtricitabine-tenofovir. I am also having him start on emtricitabine-tenofovir AF. Additionally, I am having him maintain his multivitamin, FIBER PO, acetaminophen, cyclobenzaprine, famotidine, tamsulosin, esomeprazole, and sildenafil.  Meds ordered this encounter  Medications  . emtricitabine-tenofovir AF (DESCOVY) 200-25 MG tablet    Sig: Take 1 tablet by mouth daily.    Dispense:  30 tablet    Refill:  4    Order Specific Question:   Supervising Provider    Answer:   Carlyle Basques [[id number]]     Follow-up: Return in about 3 months (around 04/21/2020), or if symptoms worsen or fail to improve.   Terri Piedra, MSN, FNP-C Nurse Practitioner [facility] for Infectious Disease [place] number: [phone]

## 2020-01-21 LAB — COMPLETE METABOLIC PANEL WITH GFR
AG Ratio: 1.8 (calc) (ref 1.0–2.5)
ALT: 24 U/L (ref 9–46)
AST: 23 U/L (ref 10–35)
Albumin: 4.8 g/dL (ref 3.6–5.1)
Alkaline phosphatase (APISO): 63 U/L (ref 35–144)
BUN: 11 mg/dL (ref 7–25)
CO2: 30 mmol/L (ref 20–32)
Calcium: 9.9 mg/dL (ref 8.6–10.3)
Chloride: 102 mmol/L (ref 98–110)
Creat: 1.03 mg/dL (ref 0.70–1.25)
GFR, Est African American: 89 mL/min/{1.73_m2} (ref >=60)
GFR, Est Non African American: 76 mL/min/{1.73_m2} (ref >=60)
Globulin: 2.7 g/dL (calc) (ref 1.9–3.7)
Glucose, Bld: 94 mg/dL (ref 65–99)
Potassium: 4.3 mmol/L (ref 3.5–5.3)
Sodium: 139 mmol/L (ref 135–146)
Total Bilirubin: 0.3 mg/dL (ref 0.2–1.2)
Total Protein: 7.5 g/dL (ref 6.1–8.1)

## 2020-01-21 LAB — CYTOLOGY, (ORAL, ANAL, URETHRAL) ANCILLARY ONLY
Chlamydia: NEGATIVE
Chlamydia: NEGATIVE
Comment: NEGATIVE
Comment: NEGATIVE
Comment: NORMAL
Comment: NORMAL
Neisseria Gonorrhea: NEGATIVE
Neisseria Gonorrhea: NEGATIVE

## 2020-01-21 LAB — RPR: RPR Ser Ql: NONREACTIVE

## 2020-01-21 LAB — URINE CYTOLOGY ANCILLARY ONLY
Chlamydia: NEGATIVE
Comment: NEGATIVE
Comment: NORMAL
Neisseria Gonorrhea: NEGATIVE

## 2020-01-21 LAB — HIV ANTIBODY (ROUTINE TESTING W REFLEX): HIV 1&2 Ab, 4th Generation: NONREACTIVE

## 2020-01-28 MED FILL — DESCOVY 200-25 MG TABS: 200-25 | 30 days supply | Qty: 30 | Fill #0

## 2020-02-03 ENCOUNTER — Other Ambulatory Visit: Payer: Self-pay

## 2020-02-03 ENCOUNTER — Ambulatory Visit: Payer: 59 | Admitting: Family Medicine

## 2020-02-03 ENCOUNTER — Encounter: Payer: Self-pay | Admitting: Family Medicine

## 2020-02-03 ENCOUNTER — Other Ambulatory Visit: Payer: Self-pay | Admitting: Family Medicine

## 2020-02-03 VITALS — BP 120/90 | HR 77 | Temp 98.0°F | Ht 67.0 in | Wt 149.8 lb

## 2020-02-03 DIAGNOSIS — R361 Hematospermia: Secondary | ICD-10-CM | POA: Diagnosis not present

## 2020-02-03 DIAGNOSIS — N342 Other urethritis: Secondary | ICD-10-CM

## 2020-02-03 DIAGNOSIS — R972 Elevated prostate specific antigen [PSA]: Secondary | ICD-10-CM

## 2020-02-03 MED ORDER — DOXYCYCLINE HYCLATE 100 MG PO TABS: 100.0000 mg | ORAL_TABLET | Freq: Two times a day (BID) | ORAL | 0 refills | Status: DC

## 2020-02-03 MED FILL — DOXYCYCLINE HYCLATE 100 MG: 100 | 7 days supply | Qty: 14 | Fill #0

## 2020-02-03 NOTE — Progress Notes (Addendum)
Waylen Depaolo T. Senya Hinzman, MD, [place]  Primary Care and Sports Medicine [facility] at [facility] [place], [address]  Phone: [phone]  FAX: [place] - 64 y.o. male  MRN [id number]  Date of Birth: 05-15-1955  Date: 02/03/2020  PCP: Pleas Koch, NP  Referral: Pleas Koch, NP  Chief Complaint  Patient presents with  . Blood when ejaculating    2 days ago    This visit occurred during the SARS-CoV-2 public health emergency.  Safety protocols were in place, including screening questions prior to the visit, additional usage of staff PPE, and extensive cleaning of exam room while observing appropriate contact time as indicated for disinfecting solutions.   Subjective:   James Barry is a 64 y.o. very pleasant male patient with Body mass index is 23.45 kg/m. who presents with the following:  He is a very nice young for age gentleman and he presents today with some acute hematospermia.  This first started a few days ago, and this blood was during ejaculation and in the ejaculate.  He did not have any significant pain, but this also returned again.  He had no blood in his urine or stool.  No melanotic stools.  He has never had anything like this before.  He is sexually active and married, but he and his partner are not monogamous.  They do not use condoms regularly.  He has recently had some STD screening.  All of this including gonorrhea, chlamydia, and HIV were all negative.  Check Trich  Check PSA  Non-gon ureth Check epid  Last PSA 2.24  Blood from tip. Doing PREP  A few days ago, was masturbating Had a lot  No blood in the urine Just in the ejaculate  Review of Systems is noted in the HPI, as appropriate  Objective:   BP 120/90   Pulse 77   Temp 98 F (36.7 C) (Temporal)   Ht 5\' 7"  (1.702 m)   Wt 149 lb 12 oz (67.9 kg)   SpO2 97%   BMI 23.45 kg/m   GEN: No acute distress;  alert,appropriate. PULM: Breathing comfortably in no respiratory distress PSYCH: Normally interactive.   GU: Normal phallus, no ulcers or wounds.  Testicles are grossly nontender.  Spermatic cord is nontender.  Laboratory and Imaging Data:  Assessment and Plan:     ICD-10-CM   1. Hematospermia  R36.1 Trichomonas vaginalis, RNA    PSA, Total with Reflex to PSA, Free    reflex PSA, Free    Ambulatory referral to Urology  2. Urethritis  N34.2 Trichomonas vaginalis, RNA    PSA, Total with Reflex to PSA, Free    reflex PSA, Free  3. Elevated PSA  R97.20 Ambulatory referral to Urology   Most likely nongonococcal urethritis.  He does have some discomfort in the tip of his penis, so I am going to give him some doxycycline to take.  He does have an elevation of his PSA to 4.3 which is increased from 6 months ago.  Plan to discuss this with the patient verbally to develop further plan of care.  Addendum: 02/04/20 2:30 PM With the PSA of 4.3 and the velocity + blood in semen, I am going to consult urology.  Meds ordered this encounter  Medications  . doxycycline (VIBRA-TABS) 100 MG tablet    Sig: Take 1 tablet (100 mg total) by mouth 2 (two) times daily for 7  days.    Dispense:  14 tablet    Refill:  0   There are no discontinued medications. Orders Placed This Encounter  Procedures  . Trichomonas vaginalis, RNA  . PSA, Total with Reflex to PSA, Free  . reflex PSA, Free  . Ambulatory referral to Urology    Follow-up: No follow-ups on file.  Signed,  Maud Deed. Latesa Fratto, MD   Outpatient Encounter Medications as of 02/03/2020  Medication Sig  . acetaminophen (TYLENOL) 650 MG CR tablet Take 650 mg by mouth every 8 (eight) hours as needed for pain.  . cyclobenzaprine (FLEXERIL) 10 MG tablet Take 0.5-1 tablets (5-10 mg total) by mouth at bedtime as needed for muscle spasms.  Marland Kitchen emtricitabine-tenofovir AF (DESCOVY) 200-25 MG tablet Take 1 tablet by mouth daily.  Marland Kitchen esomeprazole  (NEXIUM) 40 MG capsule TAKE 1 CAPSULE BY MOUTH ONCE DAILY  . famotidine (PEPCID) 20 MG tablet Take 20 mg by mouth daily.  Marland Kitchen FIBER PO Take by mouth.  . Multiple Vitamin (MULTIVITAMIN) capsule Take 1 capsule by mouth daily.  . sildenafil (VIAGRA) 100 MG tablet TAKE 1 TABLET BY MOUTH AS NEEDED FOR ERECTILE DYSFUNCTION.  . tamsulosin (FLOMAX) 0.4 MG CAPS capsule Take 1 capsule (0.4 mg total) by mouth daily after supper. For urinary symptoms.  Marland Kitchen doxycycline (VIBRA-TABS) 100 MG tablet Take 1 tablet (100 mg total) by mouth 2 (two) times daily for 7 days.   No facility-administered encounter medications on file as of 02/03/2020.

## 2020-02-04 ENCOUNTER — Encounter: Payer: Self-pay | Admitting: Family Medicine

## 2020-02-04 LAB — REFLEX PSA, FREE
PSA, % Free: 7 % (calc) — ABNORMAL LOW (ref >25)
PSA, Free: 0.3 ng/mL

## 2020-02-04 LAB — PSA, TOTAL WITH REFLEX TO PSA, FREE: PSA, Total: 4.3 ng/mL — ABNORMAL HIGH (ref <=4.0)

## 2020-02-04 NOTE — Addendum Note (Signed)
Addended by: Owens Loffler on: 02/04/2020 02:31 PM   Modules accepted: Orders

## 2020-02-07 ENCOUNTER — Encounter: Payer: Self-pay | Admitting: Family Medicine

## 2020-02-07 LAB — TRICHOMONAS VAGINALIS RNA, QL,MALES: Trichomonas vaginalis RNA: NOT DETECTED

## 2020-02-22 MED FILL — DESCOVY 200-25 MG TABS: 200-25 | 30 days supply | Qty: 30 | Fill #1

## 2020-03-14 ENCOUNTER — Other Ambulatory Visit: Payer: Self-pay | Admitting: Primary Care

## 2020-03-14 DIAGNOSIS — K219 Gastro-esophageal reflux disease without esophagitis: Secondary | ICD-10-CM

## 2020-03-14 MED FILL — ESOMEPRAZOLE MAG DR 40 MG C: 40 | 90 days supply | Qty: 90 | Fill #0

## 2020-03-14 MED FILL — TAMSULOSIN HCL 0.4 MG CAP: 0.4 | 90 days supply | Qty: 90 | Fill #2

## 2020-03-27 MED FILL — DESCOVY 200-25 MG TABS: 200-25 | 30 days supply | Qty: 30 | Fill #2

## 2020-03-29 DIAGNOSIS — R972 Elevated prostate specific antigen [PSA]: Secondary | ICD-10-CM | POA: Diagnosis not present

## 2020-03-29 DIAGNOSIS — R361 Hematospermia: Secondary | ICD-10-CM | POA: Diagnosis not present

## 2020-03-29 DIAGNOSIS — Z125 Encounter for screening for malignant neoplasm of prostate: Secondary | ICD-10-CM | POA: Diagnosis not present

## 2020-04-20 ENCOUNTER — Other Ambulatory Visit (HOSPITAL_COMMUNITY)
Admission: RE | Admit: 2020-04-20 | Discharge: 2020-04-20 | Disposition: A | Discharge status: Home / Self Care | Payer: 59 | Source: Ambulatory Visit | Attending: Family | Admitting: Family

## 2020-04-20 ENCOUNTER — Ambulatory Visit (INDEPENDENT_AMBULATORY_CARE_PROVIDER_SITE_OTHER): Payer: 59 | Admitting: Family

## 2020-04-20 ENCOUNTER — Other Ambulatory Visit: Payer: Self-pay

## 2020-04-20 ENCOUNTER — Encounter: Payer: Self-pay | Admitting: Family

## 2020-04-20 ENCOUNTER — Other Ambulatory Visit: Payer: Self-pay | Admitting: Family

## 2020-04-20 VITALS — BP 135/89 | HR 75 | Temp 97.8°F | Wt 155.0 lb

## 2020-04-20 DIAGNOSIS — Z7252 High risk homosexual behavior: Secondary | ICD-10-CM | POA: Diagnosis not present

## 2020-04-20 DIAGNOSIS — Z79899 Other long term (current) drug therapy: Secondary | ICD-10-CM

## 2020-04-20 DIAGNOSIS — R972 Elevated prostate specific antigen [PSA]: Secondary | ICD-10-CM | POA: Diagnosis not present

## 2020-04-20 HISTORY — DX: Elevated prostate specific antigen (PSA): R97.20

## 2020-04-20 MED ORDER — EMTRICITABINE-TENOFOVIR AF 200-25 MG PO TABS: 1.0000 | ORAL_TABLET | Freq: Every day | ORAL | 4 refills | Status: DC

## 2020-04-20 MED FILL — DESCOVY 200-25 MG TABS: 200-25 | 30 days supply | Qty: 30 | Fill #0

## 2020-04-20 NOTE — Progress Notes (Signed)
Subjective:    Patient ID: James Barry, male    DOB: 01-26-1956, 65 y.o.   MRN: [id number]  Chief Complaint  Patient presents with  . Follow-up    PREP     HPI:  James Barry is a 65 y.o. male on PrEP for HIV prophylaxis was last seen on 01/20/20 with good adherence and tolerance to Truvada. STI and HIV lab work was negative. Here today for routine follow up.  James Barry continues to take his Truvada daily as prescribed with no adverse side effects or missed doses since his last office visit. Overall feeling well today. Has bene concerned recently about elevated PSA and was seen by Urology. Denies fevers, chills, night sweats, headaches, changes in vision, neck pain/stiffness, nausea, diarrhea, vomiting, lesions or rashes.   Allergies  Allergen Reactions  . Buspirone   . Cephalexin       Outpatient Medications Prior to Visit  Medication Sig Dispense Refill  . acetaminophen (TYLENOL) 650 MG CR tablet Take 650 mg by mouth every 8 (eight) hours as needed for pain.    . cyclobenzaprine (FLEXERIL) 10 MG tablet Take 0.5-1 tablets (5-10 mg total) by mouth at bedtime as needed for muscle spasms. 20 tablet 0  . esomeprazole (NEXIUM) 40 MG capsule TAKE 1 CAPSULE BY MOUTH ONCE DAILY 90 capsule 1  . famotidine (PEPCID) 20 MG tablet Take 20 mg by mouth daily.    Marland Kitchen FIBER PO Take by mouth.    . Multiple Vitamin (MULTIVITAMIN) capsule Take 1 capsule by mouth daily.    . sildenafil (VIAGRA) 100 MG tablet TAKE 1 TABLET BY MOUTH AS NEEDED FOR ERECTILE DYSFUNCTION. 90 tablet 0  . tamsulosin (FLOMAX) 0.4 MG CAPS capsule Take 1 capsule (0.4 mg total) by mouth daily after supper. For urinary symptoms. 90 capsule 3  . emtricitabine-tenofovir AF (DESCOVY) 200-25 MG tablet Take 1 tablet by mouth daily. 30 tablet 4   No facility-administered medications prior to visit.     Past Medical History:  Diagnosis Date  . Diverticulosis   . Erectile dysfunction   . GERD (gastroesophageal reflux  disease)   . Hyperlipidemia   . Vertigo      Past Surgical History:  Procedure Laterality Date  . COLONOSCOPY  2013   cleared for 5 yrs- Dr @ [facility] Narda Amber)       Review of Systems  Constitutional: Negative for appetite change, chills, fatigue, fever and unexpected weight change.  Eyes: Negative for visual disturbance.  Respiratory: Negative for cough, chest tightness, shortness of breath and wheezing.   Cardiovascular: Negative for chest pain and leg swelling.  Gastrointestinal: Negative for abdominal pain, constipation, diarrhea, nausea and vomiting.  Genitourinary: Negative for dysuria, flank pain, frequency, genital sores, hematuria and urgency.  Skin: Negative for rash.  Allergic/Immunologic: Negative for immunocompromised state.  Neurological: Negative for dizziness and headaches.      Objective:    BP 135/89   Pulse 75   Temp 97.8 F (36.6 C) (Oral)   Wt 155 lb (70.3 kg)   BMI 24.28 kg/m  Nursing note and vital signs reviewed.  Physical Exam Constitutional:      General: He is not in acute distress.    Appearance: He is well-developed.  HENT:     Mouth/Throat:     Mouth: Oropharynx is clear and moist.  Eyes:     Conjunctiva/sclera: Conjunctivae normal.  Cardiovascular:     Rate and Rhythm: Normal rate and regular rhythm.  Pulses: Intact distal pulses.     Heart sounds: Normal heart sounds. No murmur heard. No friction rub. No gallop.   Pulmonary:     Effort: Pulmonary effort is normal. No respiratory distress.     Breath sounds: Normal breath sounds. No wheezing or rales.  Chest:     Chest wall: No tenderness.  Abdominal:     General: Bowel sounds are normal.     Palpations: Abdomen is soft.     Tenderness: There is no abdominal tenderness.  Musculoskeletal:     Cervical back: Neck supple.  Lymphadenopathy:     Cervical: No cervical adenopathy.  Skin:    General: Skin is warm and dry.     Findings: No rash.  Neurological:      Mental Status: He is alert and oriented to person, place, and time.  Psychiatric:        Mood and Affect: Mood and affect normal.        Behavior: Behavior normal.        Thought Content: Thought content normal.        Judgment: Judgment normal.      Depression screen [facility] 2/9 04/20/2020 01/20/2020 10/04/2019 03/25/2019 12/24/2018  Decreased Interest 0 0 0 0 0  Down, Depressed, Hopeless 0 0 0 0 0  PHQ - 2 Score 0 0 0 0 0       Assessment & Plan:    Patient Active Problem List   Diagnosis Date Noted  . Elevated PSA 04/20/2020  . Acute neck pain 08/19/2019  . Acute thoracic back pain 07/30/2019  . Clavicular asymmetry 07/30/2019  . Chest discomfort 11/06/2018  . 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 Groin discomfort 09/21/2018  . Preventative health care 08/24/2018  . On pre-exposure prophylaxis for HIV 12/17/2017  . Chronic left shoulder pain 08/13/2017  . Nocturia 08/13/2017  . Exposure to HIV 06/06/2017  . Erectile dysfunction 06/28/2015  . 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 Routine screening for STI (sexually transmitted infection) 07/14/2014  . Abnormal LFTs 07/14/2014  . Essential (primary) hypertension 07/14/2014  . Acid reflux 07/14/2014     Problem List Items Addressed This Visit      Other   On pre-exposure prophylaxis for HIV - Primary   Relevant Orders   HIV Antibody (routine testing w rflx)   RPR   Cytology (oral, anal, urethral) ancillary only   Cytology (oral, anal, urethral) ancillary only   COMPLETE METABOLIC PANEL WITH GFR   Urine cytology ancillary only([place])   High risk homosexual behavior    James Barry continues to have good adherence and tolerance to his Descovy. Check blood work and STI today. Continue current dose of Truvada. Discussed importance of safe sexual practice to reduce risk of STI. Plan for follow up in 3 months or sooner if needed.       Relevant Orders   HIV Antibody (routine testing w rflx)    RPR   Cytology (oral, anal, urethral) ancillary only   Cytology (oral, anal, urethral) ancillary only   COMPLETE METABOLIC PANEL WITH GFR   Urine cytology ancillary only()   Elevated PSA    James Barry was recently noted to have elevated PSA and is being followed by Urology. Requesting repeat PSA test today.       Relevant Orders   PSA       I am having James Nay "Richardson Landry" maintain his multivitamin, FIBER PO, acetaminophen, cyclobenzaprine, famotidine, tamsulosin, sildenafil, esomeprazole,  and emtricitabine-tenofovir AF.   Meds ordered this encounter  Medications  . emtricitabine-tenofovir AF (DESCOVY) 200-25 MG tablet    Sig: Take 1 tablet by mouth daily.    Dispense:  30 tablet    Refill:  4    Order Specific Question:   Supervising Provider    Answer:   Carlyle Basques [[id number]]     Follow-up: Return in about 3 months (around 07/18/2020).   Terri Piedra, MSN, FNP-C Nurse Practitioner [facility] for Infectious Disease [place] number: [phone]

## 2020-04-20 NOTE — Assessment & Plan Note (Signed)
James Barry was recently noted to have elevated PSA and is being followed by Urology. Requesting repeat PSA test today.

## 2020-04-20 NOTE — Assessment & Plan Note (Signed)
Mr. Trickett continues to have good adherence and tolerance to his Descovy. Check blood work and STI today. Continue current dose of Truvada. Discussed importance of safe sexual practice to reduce risk of STI. Plan for follow up in 3 months or sooner if needed.

## 2020-04-20 NOTE — Patient Instructions (Signed)
Nice to see you..  We will check your lab work today.  Continue to take your Truvada daily as prescribed.   Refills are at the pharmacy.  Plan for follow up in 3 months or sooner if needed.   Have a great day and stay safe!

## 2020-04-21 LAB — COMPLETE METABOLIC PANEL WITH GFR
AG Ratio: 1.8 (calc) (ref 1.0–2.5)
ALT: 19 U/L (ref 9–46)
AST: 20 U/L (ref 10–35)
Albumin: 4.8 g/dL (ref 3.6–5.1)
Alkaline phosphatase (APISO): 68 U/L (ref 35–144)
BUN: 12 mg/dL (ref 7–25)
CO2: 25 mmol/L (ref 20–32)
Calcium: 9.8 mg/dL (ref 8.6–10.3)
Chloride: 102 mmol/L (ref 98–110)
Creat: 0.94 mg/dL (ref 0.70–1.25)
GFR, Est African American: 99 mL/min/{1.73_m2} (ref >=60)
GFR, Est Non African American: 85 mL/min/{1.73_m2} (ref >=60)
Globulin: 2.7 g/dL (calc) (ref 1.9–3.7)
Glucose, Bld: 86 mg/dL (ref 65–99)
Potassium: 3.9 mmol/L (ref 3.5–5.3)
Sodium: 139 mmol/L (ref 135–146)
Total Bilirubin: 0.4 mg/dL (ref 0.2–1.2)
Total Protein: 7.5 g/dL (ref 6.1–8.1)

## 2020-04-21 LAB — CYTOLOGY, (ORAL, ANAL, URETHRAL) ANCILLARY ONLY
Chlamydia: NEGATIVE
Chlamydia: NEGATIVE
Comment: NEGATIVE
Comment: NEGATIVE
Comment: NORMAL
Comment: NORMAL
Neisseria Gonorrhea: NEGATIVE
Neisseria Gonorrhea: NEGATIVE

## 2020-04-21 LAB — HIV ANTIBODY (ROUTINE TESTING W REFLEX): HIV 1&2 Ab, 4th Generation: NONREACTIVE

## 2020-04-21 LAB — URINE CYTOLOGY ANCILLARY ONLY
Chlamydia: NEGATIVE
Comment: NEGATIVE
Comment: NORMAL
Neisseria Gonorrhea: NEGATIVE

## 2020-04-21 LAB — PSA: PSA: 3.32 ng/mL (ref <=4.0)

## 2020-04-21 LAB — RPR: RPR Ser Ql: NONREACTIVE

## 2020-04-25 DIAGNOSIS — Z86018 Personal history of other benign neoplasm: Secondary | ICD-10-CM | POA: Diagnosis not present

## 2020-04-25 DIAGNOSIS — L578 Other skin changes due to chronic exposure to nonionizing radiation: Secondary | ICD-10-CM | POA: Diagnosis not present

## 2020-05-09 ENCOUNTER — Other Ambulatory Visit (HOSPITAL_COMMUNITY): Payer: Self-pay | Admitting: *Deleted

## 2020-05-09 DIAGNOSIS — H52223 Regular astigmatism, bilateral: Secondary | ICD-10-CM | POA: Diagnosis not present

## 2020-05-09 DIAGNOSIS — H02889 Meibomian gland dysfunction of unspecified eye, unspecified eyelid: Secondary | ICD-10-CM | POA: Diagnosis not present

## 2020-05-09 DIAGNOSIS — H5203 Hypermetropia, bilateral: Secondary | ICD-10-CM | POA: Diagnosis not present

## 2020-05-09 DIAGNOSIS — H524 Presbyopia: Secondary | ICD-10-CM | POA: Diagnosis not present

## 2020-05-09 MED FILL — TOBRAMYCIN-DEXAMETH OPTH SU: 0.3-0.1 | 7 days supply | Qty: 5 | Fill #0

## 2020-05-19 MED FILL — DESCOVY 200-25 MG TABS: 200-25 | 30 days supply | Qty: 30 | Fill #1

## 2020-06-12 MED FILL — TAMSULOSIN HCL 0.4 MG CAP: 0.4 | 90 days supply | Qty: 90 | Fill #3

## 2020-06-12 MED FILL — ESOMEPRAZOLE MAG DR 40 MG C: 40 | 90 days supply | Qty: 90 | Fill #1

## 2020-06-14 DIAGNOSIS — L821 Other seborrheic keratosis: Secondary | ICD-10-CM | POA: Diagnosis not present

## 2020-06-15 MED FILL — DESCOVY 200-25 MG TABS: 200-25 | 30 days supply | Qty: 30 | Fill #2

## 2020-07-10 ENCOUNTER — Other Ambulatory Visit (HOSPITAL_COMMUNITY): Payer: Self-pay

## 2020-07-17 ENCOUNTER — Other Ambulatory Visit (HOSPITAL_COMMUNITY): Payer: Self-pay

## 2020-07-17 MED FILL — Emtricitabine-Tenofovir Alafenamide Fumarate Tab 200-25 MG: ORAL | 30 days supply | Qty: 30 | Fill #0 | Status: AC

## 2020-07-28 DIAGNOSIS — Z125 Encounter for screening for malignant neoplasm of prostate: Secondary | ICD-10-CM | POA: Diagnosis not present

## 2020-07-28 LAB — PSA: PSA: 5.09

## 2020-08-03 ENCOUNTER — Ambulatory Visit: Payer: 59 | Admitting: Family

## 2020-08-04 ENCOUNTER — Other Ambulatory Visit (HOSPITAL_COMMUNITY): Payer: Self-pay

## 2020-08-04 DIAGNOSIS — R972 Elevated prostate specific antigen [PSA]: Secondary | ICD-10-CM | POA: Diagnosis not present

## 2020-08-04 MED ORDER — LEVOFLOXACIN 750 MG PO TABS
ORAL_TABLET | ORAL | 0 refills | Status: DC
  Filled 2020-08-04: qty 1, 1d supply, fill #0

## 2020-08-07 ENCOUNTER — Ambulatory Visit: Payer: 59 | Admitting: Family

## 2020-08-07 ENCOUNTER — Other Ambulatory Visit: Payer: Self-pay

## 2020-08-07 ENCOUNTER — Other Ambulatory Visit (HOSPITAL_COMMUNITY): Payer: Self-pay

## 2020-08-07 ENCOUNTER — Other Ambulatory Visit (HOSPITAL_COMMUNITY)
Admission: RE | Admit: 2020-08-07 | Discharge: 2020-08-07 | Disposition: A | Discharge status: Home / Self Care | Payer: 59 | Source: Ambulatory Visit | Attending: Family | Admitting: Family

## 2020-08-07 ENCOUNTER — Encounter: Payer: Self-pay | Admitting: Family

## 2020-08-07 VITALS — BP 138/91 | HR 73 | Temp 97.8°F | Wt 157.0 lb

## 2020-08-07 DIAGNOSIS — Z79899 Other long term (current) drug therapy: Secondary | ICD-10-CM

## 2020-08-07 DIAGNOSIS — Z7252 High risk homosexual behavior: Secondary | ICD-10-CM | POA: Insufficient documentation

## 2020-08-07 MED ORDER — EMTRICITABINE-TENOFOVIR AF 200-25 MG PO TABS
1.0000 | ORAL_TABLET | Freq: Every day | ORAL | 3 refills | Status: DC
  Filled 2020-08-07 – 2020-08-08 (×3): qty 30, 30d supply, fill #0
  Filled 2020-09-11: qty 30, 30d supply, fill #1

## 2020-08-07 MED ORDER — DIAZEPAM 10 MG PO TABS
ORAL_TABLET | ORAL | 0 refills | Status: DC
  Filled 2020-08-07: qty 1, 1d supply, fill #0

## 2020-08-07 NOTE — Patient Instructions (Signed)
Nice to see you.   We will check your lab work today.   Continue to take your medications daily.  Refills have been sent to the pharmacy.  Plan for follow up in July prior to your trip.  Have a great day and stay safe! 

## 2020-08-07 NOTE — Assessment & Plan Note (Signed)
James Barry continues to take his Truvada daily as prescribed. Previous testing negative. Check lab work today including HIV and STDs. Discussed importance of safe sexual practice to reduce risk of STI. Continue current dose of Truvada. Plan for follow up in 2-3 months or sooner if needed.

## 2020-08-07 NOTE — Progress Notes (Signed)
Subjective:    Patient ID: James Barry, male    DOB: September 14, 1955, 65 y.o.   MRN: [id number]  Chief Complaint  Patient presents with  . Follow-up    Prep     HPI:  James Barry is a 65 y.o. male on pre-exposure prophylaxis for HIV last seen on 04/20/20 with good tolerance to Truvada and negative HIV and STI testing. Here today for routine follow up.  James Barry continues to take Truvada daily as prescribed with no adverse side effects.  Has no problems obtaining medication from the pharmacy.  Overall feeling well today. Denies fevers, chills, night sweats, headaches, changes in vision, neck pain/stiffness, nausea, diarrhea, vomiting, lesions or rashes.    Allergies  Allergen Reactions  . Buspirone   . Cephalexin       Outpatient Medications Prior to Visit  Medication Sig Dispense Refill  . acetaminophen (TYLENOL) 650 MG CR tablet Take 650 mg by mouth every 8 (eight) hours as needed for pain.    . cyclobenzaprine (FLEXERIL) 10 MG tablet Take 0.5-1 tablets (5-10 mg total) by mouth at bedtime as needed for muscle spasms. 20 tablet 0  . diazepam (VALIUM) 10 MG tablet Take 1 tablet by mouth 30-60 minutes prior to your procedure 1 tablet 0  . doxycycline (VIBRA-TABS) 100 MG tablet TAKE 1 TABLET BY MOUTH 2 TIMES DAILY FOR 7 DAYS. 14 tablet 0  . esomeprazole (NEXIUM) 40 MG capsule TAKE 1 CAPSULE BY MOUTH ONCE DAILY 90 capsule 1  . famotidine (PEPCID) 20 MG tablet Take 20 mg by mouth daily.    Marland Kitchen FIBER PO Take by mouth.    . levofloxacin (LEVAQUIN) 750 MG tablet Take one tablet by mouth the morning of your biopsy. 1 tablet 0  . Multiple Vitamin (MULTIVITAMIN) capsule Take 1 capsule by mouth daily.    . sildenafil (VIAGRA) 100 MG tablet TAKE 1 TABLET BY MOUTH AS NEEDED FOR ERECTILE DYSFUNCTION DAILY 90 tablet 0  . tamsulosin (FLOMAX) 0.4 MG CAPS capsule TAKE 1 CAPSULE (0.4 MG TOTAL) BY MOUTH DAILY AFTER SUPPER. FOR URINARY SYMPTOMS. 90 capsule 3  . tobramycin-dexamethasone (TOBRADEX)  ophthalmic solution INSTILL 1 DROP INTO BOTH EYES FOUR TIMES A DAY 5 mL 1  . emtricitabine-tenofovir AF (DESCOVY) 200-25 MG tablet TAKE 1 TABLET BY MOUTH DAILY. 30 tablet 4   No facility-administered medications prior to visit.     Past Medical History:  Diagnosis Date  . Diverticulosis   . Erectile dysfunction   . GERD (gastroesophageal reflux disease)   . Hyperlipidemia   . Vertigo      Past Surgical History:  Procedure Laterality Date  . COLONOSCOPY  2013   cleared for 5 yrs- Dr @ [facility] Narda Amber)       Review of Systems  Constitutional: Negative for appetite change, chills, fatigue, fever and unexpected weight change.  Eyes: Negative for visual disturbance.  Respiratory: Negative for cough, chest tightness, shortness of breath and wheezing.   Cardiovascular: Negative for chest pain and leg swelling.  Gastrointestinal: Negative for abdominal pain, constipation, diarrhea, nausea and vomiting.  Genitourinary: Negative for dysuria, flank pain, frequency, genital sores, hematuria and urgency.  Skin: Negative for rash.  Allergic/Immunologic: Negative for immunocompromised state.  Neurological: Negative for dizziness and headaches.      Objective:    BP (!) 138/91   Pulse 73   Temp 97.8 F (36.6 C) (Oral)   Wt 157 lb (71.2 kg)   BMI 24.59 kg/m  Nursing note  and vital signs reviewed.  Physical Exam Constitutional:      General: He is not in acute distress.    Appearance: He is well-developed.  Eyes:     Conjunctiva/sclera: Conjunctivae normal.  Cardiovascular:     Rate and Rhythm: Normal rate and regular rhythm.     Heart sounds: Normal heart sounds. No murmur heard. No friction rub. No gallop.   Pulmonary:     Effort: Pulmonary effort is normal. No respiratory distress.     Breath sounds: Normal breath sounds. No wheezing or rales.  Chest:     Chest wall: No tenderness.  Abdominal:     General: Bowel sounds are normal.     Palpations: Abdomen is  soft.     Tenderness: There is no abdominal tenderness.  Musculoskeletal:     Cervical back: Neck supple.  Lymphadenopathy:     Cervical: No cervical adenopathy.  Skin:    General: Skin is warm and dry.     Findings: No rash.  Neurological:     Mental Status: He is alert and oriented to person, place, and time.  Psychiatric:        Behavior: Behavior normal.        Thought Content: Thought content normal.        Judgment: Judgment normal.     Depression screen [facility] 2/9 08/07/2020 04/20/2020 01/20/2020 10/04/2019 03/25/2019  Decreased Interest 0 0 0 0 0  Down, Depressed, Hopeless 0 0 0 0 0  PHQ - 2 Score 0 0 0 0 0       Assessment & Plan:    Patient Active Problem List   Diagnosis Date Noted  . Elevated PSA 04/20/2020  . Acute neck pain 08/19/2019  . Acute thoracic back pain 07/30/2019  . Clavicular asymmetry 07/30/2019  . Chest discomfort 11/06/2018  . 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 Groin discomfort 09/21/2018  . Preventative health care 08/24/2018  . On pre-exposure prophylaxis for HIV 12/17/2017  . Chronic left shoulder pain 08/13/2017  . Nocturia 08/13/2017  . Exposure to HIV 06/06/2017  . Erectile dysfunction 06/28/2015  . 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 Routine screening for STI (sexually transmitted infection) 07/14/2014  . Abnormal LFTs 07/14/2014  . Essential (primary) hypertension 07/14/2014  . Acid reflux 07/14/2014     Problem List Items Addressed This Visit      Other   On pre-exposure prophylaxis for HIV - Primary    James Barry continues to take his Truvada daily as prescribed. Previous testing negative. Check lab work today including HIV and STDs. Discussed importance of safe sexual practice to reduce risk of STI. Continue current dose of Truvada. Plan for follow up in 2-3 months or sooner if needed.       Relevant Orders   Cytology (oral, anal, urethral) ancillary only   Cytology (oral, anal, urethral)  ancillary only   Urine cytology ancillary only([place])   HIV Antibody (routine testing w rflx)   RPR   High risk homosexual behavior   Relevant Orders   Cytology (oral, anal, urethral) ancillary only   Cytology (oral, anal, urethral) ancillary only   Urine cytology ancillary only([place])   HIV Antibody (routine testing w rflx)   RPR      I am having James Nay "Richardson Landry" maintain his multivitamin, FIBER PO, acetaminophen, cyclobenzaprine, famotidine, tobramycin-dexamethasone, esomeprazole, doxycycline, sildenafil, tamsulosin, levofloxacin, diazepam, and emtricitabine-tenofovir AF.   Meds ordered this encounter  Medications  . emtricitabine-tenofovir AF (  DESCOVY) 200-25 MG tablet    Sig: TAKE 1 TABLET BY MOUTH DAILY.    Dispense:  30 tablet    Refill:  3    Order Specific Question:   Supervising Provider    Answer:   Carlyle Basques [[id number]]    Follow-up: Return in about 2 months (around 10/07/2020).   Terri Piedra, MSN, FNP-C Nurse Practitioner [facility] for Infectious Disease [place] number: [phone]

## 2020-08-08 ENCOUNTER — Other Ambulatory Visit (HOSPITAL_COMMUNITY): Payer: Self-pay

## 2020-08-08 LAB — URINE CYTOLOGY ANCILLARY ONLY
Chlamydia: NEGATIVE
Comment: NEGATIVE
Comment: NORMAL
Neisseria Gonorrhea: NEGATIVE

## 2020-08-08 LAB — CYTOLOGY, (ORAL, ANAL, URETHRAL) ANCILLARY ONLY
Chlamydia: NEGATIVE
Chlamydia: NEGATIVE
Comment: NEGATIVE
Comment: NEGATIVE
Comment: NORMAL
Comment: NORMAL
Neisseria Gonorrhea: NEGATIVE
Neisseria Gonorrhea: NEGATIVE

## 2020-08-08 LAB — RPR: RPR Ser Ql: NONREACTIVE

## 2020-08-08 LAB — HIV ANTIBODY (ROUTINE TESTING W REFLEX): HIV 1&2 Ab, 4th Generation: NONREACTIVE

## 2020-08-09 ENCOUNTER — Other Ambulatory Visit (HOSPITAL_COMMUNITY): Payer: Self-pay

## 2020-08-11 ENCOUNTER — Other Ambulatory Visit (HOSPITAL_COMMUNITY): Payer: Self-pay

## 2020-08-15 ENCOUNTER — Other Ambulatory Visit (HOSPITAL_COMMUNITY): Payer: Self-pay

## 2020-08-16 ENCOUNTER — Other Ambulatory Visit (HOSPITAL_COMMUNITY): Payer: Self-pay

## 2020-08-28 ENCOUNTER — Encounter: Payer: 59 | Admitting: Primary Care

## 2020-08-29 ENCOUNTER — Encounter: Payer: Self-pay | Admitting: Primary Care

## 2020-08-29 ENCOUNTER — Ambulatory Visit (INDEPENDENT_AMBULATORY_CARE_PROVIDER_SITE_OTHER): Payer: 59 | Admitting: Primary Care

## 2020-08-29 ENCOUNTER — Other Ambulatory Visit: Payer: Self-pay

## 2020-08-29 VITALS — BP 120/72 | HR 102 | Temp 98.6°F | Ht 67.0 in | Wt 154.0 lb

## 2020-08-29 DIAGNOSIS — R351 Nocturia: Secondary | ICD-10-CM | POA: Diagnosis not present

## 2020-08-29 DIAGNOSIS — K219 Gastro-esophageal reflux disease without esophagitis: Secondary | ICD-10-CM | POA: Diagnosis not present

## 2020-08-29 DIAGNOSIS — Z8719 Personal history of other diseases of the digestive system: Secondary | ICD-10-CM

## 2020-08-29 DIAGNOSIS — I10 Essential (primary) hypertension: Secondary | ICD-10-CM

## 2020-08-29 DIAGNOSIS — Z79899 Other long term (current) drug therapy: Secondary | ICD-10-CM | POA: Diagnosis not present

## 2020-08-29 DIAGNOSIS — R972 Elevated prostate specific antigen [PSA]: Secondary | ICD-10-CM

## 2020-08-29 DIAGNOSIS — Z0000 Encounter for general adult medical examination without abnormal findings: Secondary | ICD-10-CM

## 2020-08-29 DIAGNOSIS — E7849 Other hyperlipidemia: Secondary | ICD-10-CM | POA: Diagnosis not present

## 2020-08-29 DIAGNOSIS — N529 Male erectile dysfunction, unspecified: Secondary | ICD-10-CM

## 2020-08-29 HISTORY — DX: Personal history of other diseases of the digestive system: Z87.19

## 2020-08-29 LAB — CBC
HCT: 38.6 % — ABNORMAL LOW (ref 39.0–52.0)
Hemoglobin: 12.8 g/dL — ABNORMAL LOW (ref 13.0–17.0)
MCHC: 33.2 g/dL (ref 30.0–36.0)
MCV: 88.6 fl (ref 78.0–100.0)
Platelets: 250 10*3/uL (ref 150.0–400.0)
RBC: 4.36 Mil/uL (ref 4.22–5.81)
RDW: 14.3 % (ref 11.5–15.5)
WBC: 6.9 10*3/uL (ref 4.0–10.5)

## 2020-08-29 LAB — LIPID PANEL
Cholesterol: 175 mg/dL (ref 0–200)
HDL: 42.9 mg/dL (ref >39.00)
LDL Cholesterol: 96 mg/dL (ref 0–99)
NonHDL: 132.24
Total CHOL/HDL Ratio: 4
Triglycerides: 182 mg/dL — ABNORMAL HIGH (ref 0.0–149.0)
VLDL: 36.4 mg/dL (ref 0.0–40.0)

## 2020-08-29 NOTE — Progress Notes (Signed)
Subjective:    Patient ID: James Barry, male    DOB: 1955-07-19, 65 y.o.   MRN: [id number]  HPI  James Barry is a very pleasant 65 y.o. male who presents today for complete physical.  He has noticed a two day history of diarrhea with generalized abdominal pain. History of diverticulitis, known diverticulosis to sigmoid and descending colon. Some bleeding that has subsided. He did eat watermelon two days ago. He is feeling better today.   Immunizations: -Tetanus: 2021 -Influenza: Completed last season  -Covid-19: Completed 4 vaccines -Shingles: Completed Shingrix -Pneumonia: Due in July 2022   Diet: [place].  Exercise: No regular exercise.  Eye exam: Completes annually  Dental exam: Completes semi-annually   Colonoscopy: 2019, due in 2024 PSA: Due  BP Readings from Last 3 Encounters:  08/29/20 120/72  08/07/20 (!) 138/91  04/20/20 135/89      Review of Systems  Constitutional:  Negative for unexpected weight change.  HENT:  Negative for rhinorrhea.   Eyes:  Negative for visual disturbance.  Respiratory:  Negative for shortness of breath.   Cardiovascular:  Negative for chest pain.  Gastrointestinal:  Positive for abdominal pain and blood in stool. Negative for constipation and diarrhea.  Genitourinary:  Negative for difficulty urinating.       Blood in semen   Musculoskeletal:  Positive for arthralgias.       Intermittent back pain  Skin:  Negative for rash.  Allergic/Immunologic: Negative for environmental allergies.  Neurological:  Negative for dizziness and headaches.  Psychiatric/Behavioral:  The patient is not nervous/anxious.         Past Medical History:  Diagnosis Date   Acute neck pain 08/19/2019   Acute thoracic back pain 07/30/2019   Clavicular asymmetry 07/30/2019   Diverticulosis    Erectile dysfunction    GERD (gastroesophageal reflux disease)    Hyperlipidemia    Vertigo     Social History   Socioeconomic History   Marital  status: Married    Spouse name: Not on file   Number of children: Not on file   Years of education: Not on file   Highest education level: Not on file  Occupational History   Not on file  Tobacco Use   Smoking status: Never   Smokeless tobacco: Never  Substance and Sexual Activity   Alcohol use: Yes    Alcohol/week: 0.0 standard drinks    Comment: social   Drug use: No   Sexual activity: Yes    Partners: Male    Comment: offered condoms  Other Topics Concern   Not on file  Social History Narrative   Married.   No children.   Retired.   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   Social Determinants of Health   Financial Resource Strain: Not on file  Food Insecurity: Not on file  Transportation Needs: Not on file  Physical Activity: Not on file  Stress: Not on file  Social Connections: Not on file  Intimate Partner Violence: Not on file    Past Surgical History:  Procedure Laterality Date   COLONOSCOPY  2013   cleared for 5 yrs- Dr @ [facility])    Family History  Problem Relation Age of Onset   Breast cancer Mother    Lung cancer Father    Heart disease Father    Hypertension Father    Arthritis Maternal Grandmother     Allergies  Allergen Reactions  Buspirone    Cephalexin     Current Outpatient Medications on File Prior to Visit  Medication Sig Dispense Refill   acetaminophen (TYLENOL) 650 MG CR tablet Take 650 mg by mouth every 8 (eight) hours as needed for pain.     diazepam (VALIUM) 10 MG tablet Take 1 tablet by mouth 30-60 minutes prior to your procedure 1 tablet 0   emtricitabine-tenofovir AF (DESCOVY) 200-25 MG tablet TAKE 1 TABLET BY MOUTH DAILY. 30 tablet 3   esomeprazole (NEXIUM) 40 MG capsule TAKE 1 CAPSULE BY MOUTH ONCE DAILY 90 capsule 1   FIBER PO Take by mouth.     levofloxacin (LEVAQUIN) 750 MG tablet Take one tablet by mouth the morning of your biopsy. 1 tablet 0   Multiple Vitamin  (MULTIVITAMIN) capsule Take 1 capsule by mouth daily.     sildenafil (VIAGRA) 100 MG tablet TAKE 1 TABLET BY MOUTH AS NEEDED FOR ERECTILE DYSFUNCTION DAILY 90 tablet 0   tamsulosin (FLOMAX) 0.4 MG CAPS capsule TAKE 1 CAPSULE (0.4 MG TOTAL) BY MOUTH DAILY AFTER SUPPER. FOR URINARY SYMPTOMS. 90 capsule 3   tobramycin-dexamethasone (TOBRADEX) ophthalmic solution INSTILL 1 DROP INTO BOTH EYES FOUR TIMES A DAY 5 mL 1   No current facility-administered medications on file prior to visit.    BP 120/72   Pulse (!) 102   Temp 98.6 F (37 C) (Temporal)   Ht 5\' 7"  (1.702 m)   Wt 154 lb (69.9 kg)   SpO2 99%   BMI 24.12 kg/m  Objective:   Physical Exam HENT:     Right Ear: Tympanic membrane and ear canal normal.     Left Ear: Tympanic membrane and ear canal normal.     Nose: Nose normal.     Right Sinus: No maxillary sinus tenderness or frontal sinus tenderness.     Left Sinus: No maxillary sinus tenderness or frontal sinus tenderness.  Eyes:     Conjunctiva/sclera: Conjunctivae normal.     Pupils: Pupils are equal, round, and reactive to light.  Neck:     Thyroid: No thyromegaly.     Vascular: No carotid bruit.  Cardiovascular:     Rate and Rhythm: Normal rate and regular rhythm.     Heart sounds: Normal heart sounds.  Pulmonary:     Effort: Pulmonary effort is normal.     Breath sounds: Normal breath sounds. No wheezing or rales.  Abdominal:     General: Bowel sounds are normal.     Palpations: Abdomen is soft.     Comments: Mild tenderness to central abdomen. No guarding.  Musculoskeletal:        General: Normal range of motion.     Cervical back: Neck supple.  Skin:    General: Skin is warm and dry.  Neurological:     Mental Status: He is alert and oriented to person, place, and time.     Cranial Nerves: No cranial nerve deficit.     Deep Tendon Reflexes: Reflexes are normal and symmetric.  Psychiatric:        Mood and Affect: Mood normal.          Assessment & Plan:       This visit occurred during the SARS-CoV-2 public health emergency.  Safety protocols were in place, including screening questions prior to the visit, additional usage of staff PPE, and extensive cleaning of exam room while observing appropriate contact time as indicated for disinfecting solutions.

## 2020-08-29 NOTE — Patient Instructions (Addendum)
Stop by the lab prior to leaving today. I will notify you of your results once received.   Please update me Friday this week if your abdominal pain persists.   Schedule a nurse visit after your birthday for the first pneumonia vaccine.  It was a pleasure to see you today!  Preventive Care 65-65 Years Old, Male Preventive care refers to lifestyle choices and visits with your health care provider that can promote health and wellness. This includes: A yearly physical exam. This is also called an annual wellness visit. Regular dental and eye exams. Immunizations. Screening for certain conditions. Healthy lifestyle choices, such as: Eating a healthy diet. Getting regular exercise. Not using drugs or products that contain nicotine and tobacco. Limiting alcohol use. What can I expect for my preventive care visit? Physical exam Your health care provider will check your: Height and weight. These may be used to calculate your BMI (body mass index). BMI is a measurement that tells if you are at a healthy weight. Heart rate and blood pressure. Body temperature. Skin for abnormal spots. Counseling Your health care provider may ask you questions about your: Past medical problems. Family's medical history. Alcohol, tobacco, and drug use. Emotional well-being. Home life and relationship well-being. Sexual activity. Diet, exercise, and sleep habits. Work and work Statistician. Access to firearms. What immunizations do I need?  Vaccines are usually given at various ages, according to a schedule. Your health care provider will recommend vaccines for you based on your age, medicalhistory, and lifestyle or other factors, such as travel or where you work. What tests do I need? Blood tests Lipid and cholesterol levels. These may be checked every 5 years, or more often if you are over 4 years old. Hepatitis C test. Hepatitis B test. Screening Lung cancer screening. You may have this screening  every year starting at age 21 if you have a 30-pack-year history of smoking and currently smoke or have quit within the past 15 years. Prostate cancer screening. Recommendations will vary depending on your family history and other risks. Genital exam to check for testicular cancer or hernias. Colorectal cancer screening. All adults should have this screening starting at age 60 and continuing until age 71. Your health care provider may recommend screening at age 66 if you are at increased risk. You will have tests every 1-10 years, depending on your results and the type of screening test. Diabetes screening. This is done by checking your blood sugar (glucose) after you have not eaten for a while (fasting). You may have this done every 1-3 years. STD (sexually transmitted disease) testing, if you are at risk. Follow these instructions at home: Eating and drinking  Eat a diet that includes fresh fruits and vegetables, whole grains, lean protein, and low-fat dairy products. Take vitamin and mineral supplements as recommended by your health care provider. Do not drink alcohol if your health care provider tells you not to drink. If you drink alcohol: Limit how much you have to 0-2 drinks a day. Be aware of how much alcohol is in your drink. In the [place] one drink equals one 12 oz bottle of beer (355 mL), one 5 oz glass of wine (148 mL), or one 1 oz glass of hard liquor (44 mL).  Lifestyle Take daily care of your teeth and gums. Brush your teeth every morning and night with fluoride toothpaste. Floss one time each day. Stay active. Exercise for at least 30 minutes 5 or more days each week. Do not  use any products that contain nicotine or tobacco, such as cigarettes, e-cigarettes, and chewing tobacco. If you need help quitting, ask your health care provider. Do not use drugs. If you are sexually active, practice safe sex. Use a condom or other form of protection to prevent STIs (sexually  transmitted infections). If told by your health care provider, take low-dose aspirin daily starting at age 65. Find healthy ways to cope with stress, such as: Meditation, yoga, or listening to music. Journaling. Talking to a trusted person. Spending time with friends and family. Safety Always wear your seat belt while driving or riding in a vehicle. Do not drive: If you have been drinking alcohol. Do not ride with someone who has been drinking. When you are tired or distracted. While texting. Wear a helmet and other protective equipment during sports activities. If you have firearms in your house, make sure you follow all gun safety procedures. What's next? Go to your health care provider once a year for an annual wellness visit. Ask your health care provider how often you should have your eyes and teeth checked. Stay up to date on all vaccines. This information is not intended to replace advice given to you by your health care provider. Make sure you discuss any questions you have with your healthcare provider. Document Revised: 12/01/2018 Document Reviewed: 02/26/2018 Elsevier Patient Education  2022 [employer].

## 2020-08-29 NOTE — Assessment & Plan Note (Signed)
Doing well on tamsulosin 0.4 mg daily, continue same.

## 2020-08-29 NOTE — Assessment & Plan Note (Signed)
Following with [facility], due for prostate biopsy in late June 2022.

## 2020-08-29 NOTE — Assessment & Plan Note (Signed)
Discussed the importance of a healthy diet and regular exercise in order for weight loss, and to reduce the risk of further co-morbidity.  Repeat lipid panel pending.  Not currently not on treatment.

## 2020-08-29 NOTE — Assessment & Plan Note (Signed)
Doing well on Nexium 40 mg daily, continue same.

## 2020-08-29 NOTE — Assessment & Plan Note (Signed)
Doing well on sildenafil 100 mg PRN, continue same.

## 2020-08-29 NOTE — Assessment & Plan Note (Signed)
Follows with infectious disease, continue Descovy.

## 2020-08-29 NOTE — Assessment & Plan Note (Signed)
Well controlled in the office today.  Continue off medication.

## 2020-08-29 NOTE — Assessment & Plan Note (Signed)
May be having some acute symptoms, overall he's feeling better today. Known diverticulosis to descending and sigmoid colon.   He will update later this week, consider antibiotic treatment if needed.

## 2020-08-29 NOTE — Assessment & Plan Note (Signed)
Prevnar 20 due in July 2022, he will set up a nurse visit. Other vaccines UTD. PSA UTD, follows with Urology. Colonoscopy UTD, due in 2024.  Discussed the importance of a healthy diet and regular exercise in order for weight loss, and to reduce the risk of further co-morbidity.  Exam today stable. Labs pending and also reviewed.

## 2020-09-08 ENCOUNTER — Other Ambulatory Visit (HOSPITAL_COMMUNITY): Payer: Self-pay

## 2020-09-11 ENCOUNTER — Other Ambulatory Visit (HOSPITAL_COMMUNITY): Payer: Self-pay

## 2020-09-12 ENCOUNTER — Other Ambulatory Visit: Payer: Self-pay | Admitting: Primary Care

## 2020-09-12 ENCOUNTER — Other Ambulatory Visit (HOSPITAL_COMMUNITY): Payer: Self-pay

## 2020-09-12 DIAGNOSIS — K219 Gastro-esophageal reflux disease without esophagitis: Secondary | ICD-10-CM

## 2020-09-12 DIAGNOSIS — R351 Nocturia: Secondary | ICD-10-CM

## 2020-09-12 DIAGNOSIS — R39198 Other difficulties with micturition: Secondary | ICD-10-CM

## 2020-09-13 ENCOUNTER — Other Ambulatory Visit: Payer: Self-pay | Admitting: Primary Care

## 2020-09-13 ENCOUNTER — Other Ambulatory Visit (HOSPITAL_COMMUNITY): Payer: Self-pay

## 2020-09-13 DIAGNOSIS — N529 Male erectile dysfunction, unspecified: Secondary | ICD-10-CM

## 2020-09-13 MED ORDER — ESOMEPRAZOLE MAGNESIUM 40 MG PO CPDR
40.0000 mg | DELAYED_RELEASE_CAPSULE | Freq: Every day | ORAL | 3 refills | Status: DC
  Filled 2020-09-13: qty 90, 90d supply, fill #0
  Filled 2020-12-28: qty 90, 90d supply, fill #1
  Filled 2021-03-19: qty 90, 90d supply, fill #2
  Filled 2021-06-18: qty 90, 90d supply, fill #3

## 2020-09-13 MED ORDER — TAMSULOSIN HCL 0.4 MG PO CAPS
ORAL_CAPSULE | ORAL | 3 refills | Status: DC
  Filled 2020-09-13: qty 90, 90d supply, fill #0

## 2020-09-14 ENCOUNTER — Other Ambulatory Visit (HOSPITAL_COMMUNITY): Payer: Self-pay

## 2020-09-14 DIAGNOSIS — R972 Elevated prostate specific antigen [PSA]: Secondary | ICD-10-CM | POA: Diagnosis not present

## 2020-09-14 DIAGNOSIS — C61 Malignant neoplasm of prostate: Secondary | ICD-10-CM | POA: Diagnosis not present

## 2020-09-14 MED ORDER — SILDENAFIL CITRATE 100 MG PO TABS
ORAL_TABLET | ORAL | 0 refills | Status: DC
  Filled 2020-09-14: qty 6, 30d supply, fill #0
  Filled 2020-12-28: qty 30, 30d supply, fill #2
  Filled 2020-12-28: qty 6, 30d supply, fill #1
  Filled 2020-12-28 – 2021-02-05 (×2): qty 30, 30d supply, fill #2
  Filled 2021-03-05: qty 30, 30d supply, fill #3

## 2020-09-25 DIAGNOSIS — C61 Malignant neoplasm of prostate: Secondary | ICD-10-CM | POA: Diagnosis not present

## 2020-09-28 ENCOUNTER — Other Ambulatory Visit (HOSPITAL_COMMUNITY): Payer: Self-pay | Admitting: Urology

## 2020-09-28 ENCOUNTER — Other Ambulatory Visit (HOSPITAL_COMMUNITY): Payer: Self-pay

## 2020-09-28 ENCOUNTER — Encounter: Payer: Self-pay | Admitting: Primary Care

## 2020-09-28 DIAGNOSIS — C61 Malignant neoplasm of prostate: Secondary | ICD-10-CM

## 2020-10-02 ENCOUNTER — Ambulatory Visit: Payer: 59 | Admitting: Family

## 2020-10-02 ENCOUNTER — Other Ambulatory Visit: Payer: Self-pay | Admitting: Family

## 2020-10-02 ENCOUNTER — Other Ambulatory Visit (HOSPITAL_COMMUNITY): Payer: Self-pay

## 2020-10-02 ENCOUNTER — Other Ambulatory Visit: Payer: Self-pay | Admitting: Urology

## 2020-10-02 MED ORDER — EMTRICITABINE-TENOFOVIR AF 200-25 MG PO TABS
1.0000 | ORAL_TABLET | Freq: Every day | ORAL | 3 refills | Status: DC
  Filled 2020-10-02 – 2020-10-06 (×2): qty 30, 30d supply, fill #0
  Filled 2020-11-22: qty 30, 30d supply, fill #1

## 2020-10-03 ENCOUNTER — Other Ambulatory Visit (HOSPITAL_COMMUNITY): Payer: Self-pay

## 2020-10-04 ENCOUNTER — Other Ambulatory Visit (HOSPITAL_COMMUNITY): Payer: Self-pay

## 2020-10-06 ENCOUNTER — Other Ambulatory Visit (HOSPITAL_COMMUNITY): Payer: Self-pay

## 2020-10-06 ENCOUNTER — Other Ambulatory Visit: Payer: Self-pay | Admitting: Family

## 2020-10-07 DIAGNOSIS — Z20822 Contact with and (suspected) exposure to covid-19: Secondary | ICD-10-CM | POA: Diagnosis not present

## 2020-10-09 NOTE — Telephone Encounter (Signed)
Message sent to let know you will address later in week.

## 2020-10-11 ENCOUNTER — Other Ambulatory Visit (HOSPITAL_COMMUNITY): Payer: Self-pay

## 2020-10-13 ENCOUNTER — Other Ambulatory Visit (HOSPITAL_COMMUNITY): Payer: Self-pay

## 2020-10-17 NOTE — Progress Notes (Signed)
GU Location of Tumor / Histology:  Adenocarcinoma of the prostate  If Prostate Cancer, Gleason Score is (4 + 3), PSA (5.09 as of 07/28/20), and Prostate volume (27.7 cc)  James Barry presented with signs/symptoms of: hematospermia and elevated PSA in January on this year. Patient elected to wait and recheck his PSA before proceeding with biopsy  Biopsies revealed:  09/14/2020   Past/Anticipated interventions by urology, if any:  12/04/2020 (tentatively)  Dr. Raynelle Bring --ROBOTIC ASSISTED LAPAROSCOPIC RADICAL PROSTATECTOMY  --BILATERAL LYMPHADENECTOMY, PELVIC  09/25/2020 Dr. Raynelle Bring  09/14/2020 Dr. Raynelle Bring Transrectal ultrasound of prostate with biopsies  Past/Anticipated interventions by medical oncology, if any:  No referral placed at this time  Weight changes, if any: Patient denies  IPSS Score: 6 (mild) SHIM Score: (20 mild ED)  Bowel/Bladder complaints, if any: Denies any changes in bowel habits. Denies any new or concerning urinary symptoms. Reports he would be mostly satisfied if he had to live with his current urinary condition for the rest of his life  Nausea/Vomiting, if any: Patient denies  Pain issues, if any:  Patient denies  SAFETY ISSUES: Prior radiation? No Pacemaker/ICD? No Possible current pregnancy? N/A Is the patient on methotrexate? No  Current Complaints / other details:  Patient has received the first 3 Moderna vaccines, as well as a Moderna booster

## 2020-10-18 ENCOUNTER — Other Ambulatory Visit: Payer: Self-pay

## 2020-10-18 ENCOUNTER — Ambulatory Visit
Admission: RE | Admit: 2020-10-18 | Discharge: 2020-10-18 | Disposition: A | Discharge status: Home / Self Care | Payer: 59 | Source: Ambulatory Visit | Attending: Radiation Oncology | Admitting: Radiation Oncology

## 2020-10-18 ENCOUNTER — Ambulatory Visit (HOSPITAL_COMMUNITY)
Admission: RE | Admit: 2020-10-18 | Discharge: 2020-10-18 | Disposition: A | Discharge status: Home / Self Care | Payer: 59 | Source: Ambulatory Visit | Attending: Urology | Admitting: Urology

## 2020-10-18 DIAGNOSIS — C61 Malignant neoplasm of prostate: Secondary | ICD-10-CM | POA: Insufficient documentation

## 2020-10-18 HISTORY — DX: Malignant neoplasm of prostate: C61

## 2020-10-18 MED ORDER — PIFLIFOLASTAT F 18 (PYLARIFY) INJECTION
9.0000 | Freq: Once | INTRAVENOUS | Status: AC
  Administered 2020-10-18: 8.1 via INTRAVENOUS

## 2020-10-18 NOTE — Progress Notes (Signed)
Radiation Oncology         [phone] ________________________________  Initial Outpatient Consultation  Name: James Barry MRN: [id number]  Date: 10/18/2020  DOB: 1956-02-03  [id number], Leticia Penna, NP  Raynelle Bring, MD   REFERRING PHYSICIAN: Raynelle Bring, MD  DIAGNOSIS: 65 y.o. gentleman with Stage T1c adenocarcinoma of the prostate with Gleason score of 4+3, and PSA of 5.09.    ICD-10-CM   1. Malignant neoplasm of prostate ([place])  C61       HISTORY OF PRESENT ILLNESS: James Barry is a 65 y.o. male with a diagnosis of prostate cancer. He was noted to have an elevated PSA of 4.3 in 11 of 2021 and hematospermia by his primary care provider, Loma Boston, NP.his prior baseline PSA was 2.24 in May 2021.  Accordingly, he was referred for evaluation in urology by Dr. Alinda Money on 03/29/2020,  digital rectal examination was performed at that time revealing slight asymmetry with the right lobe larger than left and slight firmness on the right but no discrete nodularities or high suspicion for malignancy.  A repeat PSA that day had come down slightly to 3.93 but when repeated again 2 months later on 07/28/2020, was elevated at 5.09.  The patient proceeded to transrectal ultrasound with 12 biopsies of the prostate on 09/14/2020.  The prostate volume measured 27.7 cc.  Out of 12 core biopsies, 1 was positive.  The maximum Gleason score was 4+3, and this was seen in the right base.  To complete his disease staging, he is scheduled for a PSMA PET scan this afternoon at 2 PM.  The patient reviewed the biopsy results with his urologist and he has kindly been referred today for discussion of potential radiation treatment options.  He is tentatively scheduled for RALP with Dr. Alinda Money on 12/04/2020.   PREVIOUS RADIATION THERAPY: No  PAST MEDICAL HISTORY:  Past Medical History:  Diagnosis Date   Acute neck pain 08/19/2019   Acute thoracic back pain 07/30/2019   Clavicular asymmetry 07/30/2019    Diverticulosis    Erectile dysfunction    GERD (gastroesophageal reflux disease)    Hyperlipidemia    Vertigo       PAST SURGICAL HISTORY: Past Surgical History:  Procedure Laterality Date   COLONOSCOPY  2013   cleared for 5 yrs- Dr @ [facility])    FAMILY HISTORY:  Family History  Problem Relation Age of Onset   Breast cancer Mother    Lung cancer Father    Heart disease Father    Hypertension Father    Arthritis Maternal Grandmother     SOCIAL HISTORY:  Social History   Socioeconomic History   Marital status: Married    Spouse name: Not on file   Number of children: Not on file   Years of education: Not on file   Highest education level: Not on file  Occupational History   Not on file  Tobacco Use   Smoking status: Never   Smokeless tobacco: Never  Substance and Sexual Activity   Alcohol use: Yes    Alcohol/week: 0.0 standard drinks    Comment: social   Drug use: No   Sexual activity: Yes    Partners: Male    Comment: offered condoms  Other Topics Concern   Not on file  Social History Narrative   Married.   No children.   Retired.   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   Social Determinants of  Health   Financial Resource Strain: Not on file  Food Insecurity: Not on file  Transportation Needs: Not on file  Physical Activity: Not on file  Stress: Not on file  Social Connections: Not on file  Intimate Partner Violence: Not on file    ALLERGIES: Buspirone and Cephalexin  MEDICATIONS:  Current Outpatient Medications  Medication Sig Dispense Refill   acetaminophen (TYLENOL) 650 MG CR tablet Take 650 mg by mouth every 8 (eight) hours as needed for pain.     diazepam (VALIUM) 10 MG tablet Take 1 tablet by mouth 30-60 minutes prior to your procedure 1 tablet 0   emtricitabine-tenofovir AF (DESCOVY) 200-25 MG tablet TAKE 1 TABLET BY MOUTH DAILY. 30 tablet 3   esomeprazole (NEXIUM) 40 MG capsule Take  1 capsule  by mouth daily for heartburn. 90 capsule 3   FIBER PO Take by mouth.     levofloxacin (LEVAQUIN) 750 MG tablet Take one tablet by mouth the morning of your biopsy. 1 tablet 0   Multiple Vitamin (MULTIVITAMIN) capsule Take 1 capsule by mouth daily.     sildenafil (VIAGRA) 100 MG tablet Take 1 tablet by mouth 30-60 minutes prior to intercourse as needed. 90 tablet 0   tamsulosin (FLOMAX) 0.4 MG CAPS capsule TAKE 1 CAPSULE BY MOUTH DAILY AFTER SUPPER FOR URINARY SYMPTOMS. 90 capsule 3   tobramycin-dexamethasone (TOBRADEX) ophthalmic solution INSTILL 1 DROP INTO BOTH EYES FOUR TIMES A DAY 5 mL 1   No current facility-administered medications for this encounter.    REVIEW OF SYSTEMS:  On review of systems, the patient reports that he is doing well overall. He denies any chest pain, shortness of breath, cough, fevers, chills, night sweats, unintended weight changes. He denies any bowel disturbances, and denies abdominal pain, nausea or vomiting. He denies any new musculoskeletal or joint aches or pains. His IPSS was 6, indicating mild urinary symptoms with nocturia x3. His SHIM was 20, indicating he has mild erectile dysfunction. A complete review of systems is obtained and is otherwise negative.    PHYSICAL EXAM:  Wt Readings from Last 3 Encounters:  08/29/20 154 lb (69.9 kg)  08/07/20 157 lb (71.2 kg)  04/20/20 155 lb (70.3 kg)   Temp Readings from Last 3 Encounters:  08/29/20 98.6 F (37 C) (Temporal)  08/07/20 97.8 F (36.6 C) (Oral)  04/20/20 97.8 F (36.6 C) (Oral)   BP Readings from Last 3 Encounters:  08/29/20 120/72  08/07/20 (!) 138/91  04/20/20 135/89   Pulse Readings from Last 3 Encounters:  08/29/20 (!) 102  08/07/20 73  04/20/20 75    /10  In general this is a well appearing Caucasian male in no acute distress. He's alert and oriented x4 and appropriate throughout the examination. Cardiopulmonary assessment is negative for acute distress, and he exhibits  normal effort.     KPS = 100  100 - Normal; no complaints; no evidence of disease. 90   - Able to carry on normal activity; minor signs or symptoms of disease. 80   - Normal activity with effort; some signs or symptoms of disease. 70   - Cares for self; unable to carry on normal activity or to do active work. 60   - Requires occasional assistance, but is able to care for most of his personal needs. 50   - Requires considerable assistance and frequent medical care. 6   - Disabled; requires special care and assistance. 30   - Severely disabled; hospital admission is indicated although  death not imminent. 7   - Very sick; hospital admission necessary; active supportive treatment necessary. 10   - Moribund; fatal processes progressing rapidly. 0     - Dead  Karnofsky DA, Abelmann [place], Craver LS and Burchenal [facility] [phone]) The use of the nitrogen mustards in the palliative treatment of carcinoma: with particular reference to bronchogenic carcinoma Cancer 1 634-56  LABORATORY DATA:  Lab Results  Component Value Date   WBC 6.9 08/29/2020   HGB 12.8 (L) 08/29/2020   HCT 38.6 (L) 08/29/2020   MCV 88.6 08/29/2020   PLT 250.0 08/29/2020   Lab Results  Component Value Date   NA 139 04/20/2020   K 3.9 04/20/2020   CL 102 04/20/2020   CO2 25 04/20/2020   Lab Results  Component Value Date   ALT 19 04/20/2020   AST 20 04/20/2020   ALKPHOS 74 08/14/2018   BILITOT 0.4 04/20/2020     RADIOGRAPHY: No results found.    IMPRESSION/PLAN: 1. 66 y.o. gentleman with Stage T1c adenocarcinoma of the prostate with Gleason Score of 4+3, and PSA of 5.09. We discussed the patient's workup and outlined the nature of prostate cancer in this setting. The patient's T stage, Gleason's score, and PSA put him into the unfavorable intermediate risk group. Accordingly, he is eligible for a variety of potential treatment options including brachytherapy, 5.5 weeks of external radiation,  or prostatectomy. We  discussed the available radiation techniques, and focused on the details and logistics of delivery. We discussed and outlined the risks, benefits, short and long-term effects associated with radiotherapy and compared and contrasted these with prostatectomy. We discussed the role of SpaceOAR gel in reducing the rectal toxicity associated with radiotherapy. He appears to have a good understanding of his disease and our treatment recommendations which are of curative intent.    The patient focused most of his questions and interest in robotic-assisted laparoscopic radical prostatectomy.  We discussed some of the potential advantages of surgery including surgical staging, the availability of salvage radiotherapy to the prostatic fossa, and the confidence associated with immediate biochemical response.  We discussed some of the potential proven indications for postoperative radiotherapy including positive margins, extracapsular extension, and seminal vesicle involvement. We also talked about some of the other potential findings leading to a recommendation for radiotherapy including a non-zero postoperative PSA and positive lymph nodes. He was encouraged to ask questions that were answered to his stated satisfaction.  At the conclusion of our conversation, the patient is interested in moving forward with prostatectomy which is already tentatively scheduled for 12/04/2020. We enjoyed meeting with him today, and will look forward to following his progress via correspondence and would be more than happy to continue to participate in his care if clinically indicated in the future.    We personally spent 60 minutes in this encounter including chart review, reviewing radiological studies, meeting face-to-face with the patient, entering orders and completing documentation.    Nicholos Johns, PA-C    Tyler Pita, MD  [facility] Direct Dial: [phone]  Fax:  [phone] [email or web address]  Skype  LinkedIn

## 2020-11-22 ENCOUNTER — Other Ambulatory Visit (HOSPITAL_COMMUNITY)
Admission: RE | Admit: 2020-11-22 | Discharge: 2020-11-22 | Disposition: A | Discharge status: Home / Self Care | Payer: 59 | Source: Ambulatory Visit | Attending: Family | Admitting: Family

## 2020-11-22 ENCOUNTER — Ambulatory Visit (INDEPENDENT_AMBULATORY_CARE_PROVIDER_SITE_OTHER): Payer: 59 | Admitting: Family

## 2020-11-22 ENCOUNTER — Other Ambulatory Visit (HOSPITAL_COMMUNITY): Payer: Self-pay

## 2020-11-22 ENCOUNTER — Other Ambulatory Visit: Payer: Self-pay

## 2020-11-22 ENCOUNTER — Encounter: Payer: Self-pay | Admitting: Family

## 2020-11-22 VITALS — BP 131/88 | HR 76 | Temp 97.9°F | Wt 160.0 lb

## 2020-11-22 DIAGNOSIS — Z79899 Other long term (current) drug therapy: Secondary | ICD-10-CM

## 2020-11-22 DIAGNOSIS — Z7252 High risk homosexual behavior: Secondary | ICD-10-CM

## 2020-11-22 DIAGNOSIS — Z5181 Encounter for therapeutic drug level monitoring: Secondary | ICD-10-CM | POA: Diagnosis not present

## 2020-11-22 MED ORDER — EMTRICITABINE-TENOFOVIR AF 200-25 MG PO TABS
1.0000 | ORAL_TABLET | Freq: Every day | ORAL | 3 refills | Status: DC
  Filled 2020-11-23: qty 30, 30d supply, fill #0
  Filled 2020-12-20: qty 30, 30d supply, fill #1
  Filled 2021-01-17 (×2): qty 30, 30d supply, fill #2
  Filled 2021-02-09: qty 30, 30d supply, fill #3

## 2020-11-22 NOTE — Assessment & Plan Note (Signed)
James Barry continues to have good tolerance to his Truvada for PrEP. Discussed importance of safe sexual practice and condom use. Check HIV and STD today. Continue current dose of Truvada. Plan for follow up in 3 months or sooner if needed.

## 2020-11-22 NOTE — Patient Instructions (Signed)
Nice to see you.  Continue to take your medication daily.  Refills have been sent to the pharmacy.  Plan for follow up in 3 months or sooner if needed.   Have a great day and stay safe!

## 2020-11-22 NOTE — Progress Notes (Signed)
Subjective:    Patient ID: James Barry, male    DOB: [birth year], 65 y.o.   MRN: [id number]  Chief Complaint  Patient presents with   Follow-up    PREP    HPI:  TAJE EADS is a 65 y.o. male on pre-exposure prophylaxis for HIV last seen on 08/07/20 with good tolerance to Descovy. HIV and STD testing were negative. Scheduled for prostatectomy on 12/04/20 for prostate cancer. Here today for routine follow up.  Mr. Timbers continues to take his Descovy with no adverse side effects. Overall feeling well today with no new concerns/complaints. Does have anxiety about his upcoming procedure. Denies fevers, chills, night sweats, headaches, changes in vision, neck pain/stiffness, nausea, diarrhea, vomiting, lesions or rashes. No problems obtaining medication from the pharmacy. Monkeypox vaccination series in progress. Just returned from month long trip.     Allergies  Allergen Reactions   Buspirone     Unaware    Cephalexin     Unaware       Outpatient Medications Prior to Visit  Medication Sig Dispense Refill   acetaminophen (TYLENOL) 650 MG CR tablet Take 1,300 mg by mouth every morning.     esomeprazole (NEXIUM) 40 MG capsule Take 1 capsule  by mouth daily for heartburn. 90 capsule 3   FIBER PO Take 2 capsules by mouth daily.     Multiple Vitamin (MULTIVITAMIN) capsule Take 2 capsules by mouth daily.     sildenafil (VIAGRA) 100 MG tablet Take 1 tablet by mouth 30-60 minutes prior to intercourse as needed. 90 tablet 0   tamsulosin (FLOMAX) 0.4 MG CAPS capsule TAKE 1 CAPSULE BY MOUTH DAILY AFTER SUPPER FOR URINARY SYMPTOMS. 90 capsule 3   tobramycin-dexamethasone (TOBRADEX) ophthalmic solution INSTILL 1 DROP INTO BOTH EYES FOUR TIMES A DAY (Patient taking differently: Place 1 drop into both eyes daily as needed (irritation).) 5 mL 1   emtricitabine-tenofovir AF (DESCOVY) 200-25 MG tablet TAKE 1 TABLET BY MOUTH DAILY. 30 tablet 3   No facility-administered medications prior to visit.      Past Medical History:  Diagnosis Date   Acute neck pain 08/19/2019   Acute thoracic back pain 07/30/2019   Clavicular asymmetry 07/30/2019   Diverticulosis    Erectile dysfunction    GERD (gastroesophageal reflux disease)    Hyperlipidemia    Vertigo      Past Surgical History:  Procedure Laterality Date   COLONOSCOPY  2013   cleared for 5 yrs- Dr @ [facility])       Review of Systems  Constitutional:  Negative for appetite change, chills, fatigue, fever and unexpected weight change.  Eyes:  Negative for visual disturbance.  Respiratory:  Negative for cough, chest tightness, shortness of breath and wheezing.   Cardiovascular:  Negative for chest pain and leg swelling.  Gastrointestinal:  Negative for abdominal pain, constipation, diarrhea, nausea and vomiting.  Genitourinary:  Negative for dysuria, flank pain, frequency, genital sores, hematuria and urgency.  Skin:  Negative for rash.  Allergic/Immunologic: Negative for immunocompromised state.  Neurological:  Negative for dizziness and headaches.     Objective:    BP 131/88   Pulse 76   Temp 97.9 F (36.6 C) (Oral)   Wt 160 lb (72.6 kg)   BMI 25.06 kg/m  Nursing note and vital signs reviewed.  Physical Exam Constitutional:      General: He is not in acute distress.    Appearance: He is well-developed.  Eyes:  Conjunctiva/sclera: Conjunctivae normal.  Cardiovascular:     Rate and Rhythm: Normal rate and regular rhythm.     Heart sounds: Normal heart sounds. No murmur heard.   No friction rub. No gallop.  Pulmonary:     Effort: Pulmonary effort is normal. No respiratory distress.     Breath sounds: Normal breath sounds. No wheezing or rales.  Chest:     Chest wall: No tenderness.  Abdominal:     General: Bowel sounds are normal.     Palpations: Abdomen is soft.     Tenderness: There is no abdominal tenderness.  Musculoskeletal:     Cervical back: Neck supple.  Lymphadenopathy:      Cervical: No cervical adenopathy.  Skin:    General: Skin is warm and dry.     Findings: No rash.  Neurological:     Mental Status: He is alert and oriented to person, place, and time.  Psychiatric:        Behavior: Behavior normal.        Thought Content: Thought content normal.        Judgment: Judgment normal.     Depression screen [facility] 2/9 11/22/2020 08/29/2020 08/07/2020 04/20/2020 01/20/2020  Decreased Interest 0 0 0 0 0  Down, Depressed, Hopeless 0 0 0 0 0  PHQ - 2 Score 0 0 0 0 0  Altered sleeping - 0 - - -  Tired, decreased energy - 0 - - -  Change in appetite - 0 - - -  Feeling bad or failure about yourself  - 0 - - -  Trouble concentrating - 0 - - -  Moving slowly or fidgety/restless - 0 - - -  Suicidal thoughts - 0 - - -  PHQ-9 Score - 0 - - -  Difficult doing work/chores - Not difficult at all - - -       Assessment & Plan:    Patient Active Problem List   Diagnosis Date Noted   Malignant neoplasm of prostate ([place] 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 Gutowski continues to have good tolerance to his Truvada for PrEP. Discussed importance of safe sexual practice and condom use. Check HIV and STD today. Continue current dose of Truvada. Plan for follow up in 3 months or sooner if needed.       Relevant Orders   COMPLETE METABOLIC PANEL WITH GFR   Cytology (oral, anal, urethral) ancillary only   Cytology (oral, anal, urethral) ancillary only  RPR   HIV Antibody (routine testing w rflx)   Urine cytology ancillary only([place])   Other Visit Diagnoses     Therapeutic drug monitoring            I am having [name] maintain his multivitamin, FIBER PO, acetaminophen, tobramycin-dexamethasone, esomeprazole, tamsulosin, sildenafil, and emtricitabine-tenofovir AF.   Meds ordered this encounter  Medications   emtricitabine-tenofovir AF (DESCOVY) 200-25 MG tablet    Sig: TAKE 1 TABLET BY MOUTH DAILY.    Dispense:  30 tablet    Refill:  3    Order Specific Question:   Supervising Provider    Answer:   Carlyle Basques [[id number]]     Follow-up: Return in about 3 months (around 02/21/2021), or if symptoms worsen or fail to improve.   Terri Piedra, MSN, FNP-C Nurse Practitioner [facility] [place] number: [phone]

## 2020-11-23 ENCOUNTER — Other Ambulatory Visit (HOSPITAL_COMMUNITY): Payer: Self-pay

## 2020-11-23 LAB — CYTOLOGY, (ORAL, ANAL, URETHRAL) ANCILLARY ONLY
Chlamydia: NEGATIVE
Chlamydia: NEGATIVE
Comment: NEGATIVE
Comment: NEGATIVE
Comment: NORMAL
Comment: NORMAL
Neisseria Gonorrhea: NEGATIVE
Neisseria Gonorrhea: NEGATIVE

## 2020-11-23 LAB — COMPLETE METABOLIC PANEL WITH GFR
AG Ratio: 1.6 (calc) (ref 1.0–2.5)
ALT: 18 U/L (ref 9–46)
AST: 19 U/L (ref 10–35)
Albumin: 4.5 g/dL (ref 3.6–5.1)
Alkaline phosphatase (APISO): 65 U/L (ref 35–144)
BUN: 13 mg/dL (ref 7–25)
CO2: 27 mmol/L (ref 20–32)
Calcium: 9 mg/dL (ref 8.6–10.3)
Chloride: 103 mmol/L (ref 98–110)
Creat: 0.99 mg/dL (ref 0.70–1.35)
Globulin: 2.9 g/dL (calc) (ref 1.9–3.7)
Glucose, Bld: 78 mg/dL (ref 65–99)
Potassium: 3.8 mmol/L (ref 3.5–5.3)
Sodium: 139 mmol/L (ref 135–146)
Total Bilirubin: 0.3 mg/dL (ref 0.2–1.2)
Total Protein: 7.4 g/dL (ref 6.1–8.1)
eGFR: 85 mL/min/{1.73_m2} (ref >=60)

## 2020-11-23 LAB — URINE CYTOLOGY ANCILLARY ONLY
Chlamydia: NEGATIVE
Comment: NEGATIVE
Comment: NORMAL
Neisseria Gonorrhea: NEGATIVE

## 2020-11-23 LAB — RPR: RPR Ser Ql: NONREACTIVE

## 2020-11-23 LAB — HIV ANTIBODY (ROUTINE TESTING W REFLEX): HIV 1&2 Ab, 4th Generation: NONREACTIVE

## 2020-11-24 DIAGNOSIS — M6281 Muscle weakness (generalized): Secondary | ICD-10-CM | POA: Diagnosis not present

## 2020-11-24 DIAGNOSIS — M6289 Other specified disorders of muscle: Secondary | ICD-10-CM | POA: Diagnosis not present

## 2020-11-24 DIAGNOSIS — M62838 Other muscle spasm: Secondary | ICD-10-CM | POA: Diagnosis not present

## 2020-11-24 DIAGNOSIS — C61 Malignant neoplasm of prostate: Secondary | ICD-10-CM | POA: Diagnosis not present

## 2020-11-24 NOTE — Patient Instructions (Addendum)
DUE TO COVID-19 ONLY ONE VISITOR IS ALLOWED TO COME WITH YOU AND STAY IN THE WAITING ROOM ONLY DURING PRE OP AND PROCEDURE.   **NO VISITORS ARE ALLOWED IN THE SHORT STAY AREA OR RECOVERY ROOM!!**  IF YOU WILL BE ADMITTED INTO THE HOSPITAL YOU ARE ALLOWED ONLY TWO SUPPORT PEOPLE DURING VISITATION HOURS ONLY (10AM -8PM)   The support person(s) may change daily. The support person(s) must pass our screening, gel in and out, and wear a mask at all times, including in the patient's room. Patients must also wear a mask when staff or their support person are in the room.  No visitors under the age of 20. Any visitor under the age of 57 must be accompanied by an adult.        Your procedure is scheduled on: 12/04/20   Report to [facility] Main  Entrance   Report to Short Stay at 5:15 AM   [facility])   Call this number if you have problems the morning of surgery [phone]   Do not eat food :After Midnight.   May have liquids until 4:15 AM day of surgery  CLEAR LIQUID DIET  Foods Allowed                                                                     Foods Excluded  Water, Black Coffee and tea (no milk or creamer)           liquids that you cannot  Plain Jell-O in any flavor  (No red)                                     see through such as: Fruit ices (not with fruit pulp)                                            milk, soups, orange juice              Iced Popsicles (No red)                                                All solid food                                   Apple juices Sports drinks like Gatorade (No red) Lightly seasoned clear broth or consume(fat free) Sugar  Oral Hygiene is also important to reduce your risk of infection.                                    Remember - BRUSH YOUR TEETH THE MORNING OF SURGERY WITH YOUR REGULAR TOOTHPASTE   Take these medicines the morning of surgery with A SIP OF WATER: Tylenol, Descovy, Esomeprazole.  You may not have any metal on your body including jewelry, and body piercing             Do not wear lotions, powders, cologne, or deodorant              Men may shave face and neck.   Do not bring valuables to the hospital. [place].   Bring small overnight bag day of surgery.   Special Instructions: Bring a copy of your healthcare power of attorney and living will documents         the day of surgery if you haven't scanned them in before.   Please read over the following fact sheets you were given: IF YOU HAVE QUESTIONS ABOUT YOUR PRE OP INSTRUCTIONS PLEASE CALL [phone]- [place] - Preparing for Surgery Before surgery, you can play an important role.  Because skin is not sterile, your skin needs to be as free of germs as possible.  You can reduce the number of germs on your skin by washing with CHG (chlorahexidine gluconate) soap before surgery.  CHG is an antiseptic cleaner which kills germs and bonds with the skin to continue killing germs even after washing. Please DO NOT use if you have an allergy to CHG or antibacterial soaps.  If your skin becomes reddened/irritated stop using the CHG and inform your nurse when you arrive at Short Stay. Do not shave (including legs and underarms) for at least 48 hours prior to the first CHG shower.  You may shave your face/neck.  Please follow these instructions carefully:  1.  Shower with CHG Soap the night before surgery and the  morning of surgery.  2.  If you choose to wash your hair, wash your hair first as usual with your normal  shampoo.  3.  After you shampoo, rinse your hair and body thoroughly to remove the shampoo.                             4.  Use CHG as you would any other liquid soap.  You can apply chg directly to the skin and wash.  Gently with a scrungie or clean washcloth.  5.  Apply the CHG Soap to your body ONLY FROM THE NECK DOWN.   Do   not use on  face/ open                           Wound or open sores. Avoid contact with eyes, ears mouth and   genitals (private parts).                       Wash face,  Genitals (private parts) with your normal soap.             6.  Wash thoroughly, paying special attention to the area where your    surgery  will be performed.  7.  Thoroughly rinse your body with warm water from the neck down.  8.  DO NOT shower/wash with your normal soap after using and rinsing off the CHG Soap.                9.  Pat yourself dry with a clean towel.  10.  Wear clean pajamas.            11.  Place clean sheets on your bed the night of your first shower and do not  sleep with pets. Day of Surgery : Do not apply any lotions/deodorants the morning of surgery.  Please wear clean clothes to the hospital/surgery center.  FAILURE TO FOLLOW THESE INSTRUCTIONS MAY RESULT IN THE CANCELLATION OF YOUR SURGERY  PATIENT SIGNATURE_________________________________  NURSE SIGNATURE__________________________________  ________________________________________________________________________

## 2020-11-24 NOTE — Progress Notes (Addendum)
COVID swab appointment: N/a  COVID Vaccine Completed: yes x3 Date COVID Vaccine completed: 06/03/19, 07/01/19 Has received booster: 06/16/20 COVID vaccine manufacturer:  Moderna     Date of COVID positive in last 90 days: yes 10/07/20, Epic  PCP - Alma Friendly, NP Cardiologist - N/a  Chest x-ray - PET 10/18/20 Epic EKG - 11/27/20 Epic Stress Test - 2 years ago per pt ECHO - 11/19/18 Epic Cardiac Cath - N/a Pacemaker/ICD device last checked:N/a Spinal Cord Stimulator: N/a  Sleep Study - N/a CPAP -   Fasting Blood Sugar - N/a Checks Blood Sugar _____ times a day  Blood Thinner Instructions: N/a Aspirin Instructions: Last Dose:  Activity level: Can go up a flight of stairs and perform activities of daily living without stopping and without symptoms of chest pain or shortness of breath.       Anesthesia review: PAT BP 146/100 and 157/101. Patient was very nervous during appointment and says it it usually high when he is feeling that way. He does not have a BP cuff at home, he says he will get one. Got EKG and brought to PA, Angela's attention. Per her instructions he needs to call his PCP to let them know. Patients stated he will call his PCP.  Patient denies shortness of breath, fever, cough and chest pain at PAT appointment   Patient verbalized understanding of instructions that were given to them at the PAT appointment. Patient was also instructed that they will need to review over the PAT instructions again at home before surgery.

## 2020-11-27 ENCOUNTER — Encounter (HOSPITAL_COMMUNITY)
Admission: RE | Admit: 2020-11-27 | Discharge: 2020-11-27 | Disposition: A | Discharge status: Home / Self Care | Payer: 59 | Source: Ambulatory Visit | Attending: Urology | Admitting: Urology

## 2020-11-27 ENCOUNTER — Encounter (HOSPITAL_COMMUNITY): Payer: Self-pay

## 2020-11-27 ENCOUNTER — Other Ambulatory Visit: Payer: Self-pay

## 2020-11-27 DIAGNOSIS — Z01818 Encounter for other preprocedural examination: Secondary | ICD-10-CM | POA: Insufficient documentation

## 2020-11-27 HISTORY — DX: Malignant (primary) neoplasm, unspecified: C80.1

## 2020-11-27 HISTORY — DX: Unspecified osteoarthritis, unspecified site: M19.90

## 2020-11-27 LAB — BASIC METABOLIC PANEL
Anion gap: 9 (ref 5–15)
BUN: 12 mg/dL (ref 8–23)
CO2: 27 mmol/L (ref 22–32)
Calcium: 10.2 mg/dL (ref 8.9–10.3)
Chloride: 106 mmol/L (ref 98–111)
Creatinine, Ser: 0.89 mg/dL (ref 0.61–1.24)
GFR, Estimated: >60 mL/min (ref >60)
Glucose, Bld: 97 mg/dL (ref 70–99)
Potassium: 4.3 mmol/L (ref 3.5–5.1)
Sodium: 142 mmol/L (ref 135–145)

## 2020-11-27 LAB — CBC
HCT: 41.9 % (ref 39.0–52.0)
Hemoglobin: 13.5 g/dL (ref 13.0–17.0)
MCH: 28.5 pg (ref 26.0–34.0)
MCHC: 32.2 g/dL (ref 30.0–36.0)
MCV: 88.6 fL (ref 80.0–100.0)
Platelets: 237 10*3/uL (ref 150–400)
RBC: 4.73 MIL/uL (ref 4.22–5.81)
RDW: 13.8 % (ref 11.5–15.5)
WBC: 7.7 10*3/uL (ref 4.0–10.5)
nRBC: 0 % (ref 0.0–0.2)

## 2020-11-28 DIAGNOSIS — L570 Actinic keratosis: Secondary | ICD-10-CM | POA: Diagnosis not present

## 2020-11-28 DIAGNOSIS — Z86018 Personal history of other benign neoplasm: Secondary | ICD-10-CM | POA: Diagnosis not present

## 2020-11-28 DIAGNOSIS — L578 Other skin changes due to chronic exposure to nonionizing radiation: Secondary | ICD-10-CM | POA: Diagnosis not present

## 2020-11-29 NOTE — H&P (Signed)
Prostate Cancer     James Barry is a 65 year old gentleman who was found to have an elevated PSA of 5.09 prompting a TRUS biopsy of the prostate on 09/14/20. This confirmed Gleason 4+3=7 adenocarcinoma with 1 out of 12 biopsy cores positive.   Family history: His mother died of metastatic breast cancer at age 19.   Imaging studies: None.   PMH: He has a history of anxiety, GERD (ulcer), arthritis, depression, hyperlipidemia, and hypertension.  PSH: Bilateral inguinal hernia repair.   TNM stage: cT1c Nx Mx  PSA: 5.09  Gleason score: 4+3=7 (GG 3)  Biopsy (09/14/20): 1/12 cores positive  Left: R base (30%, 4+3=7)  Right: Benign  Prostate volume: 27.7 cc   Nomogram  OC disease: 64%  EPE: 33%  SVI: 4%  LNI: 6%  PFS (5 year, 10 year): 74%, 60%   Urinary function: IPSS is 8.  Erectile function: SHIM score is 18. He uses Viagra 100 mg with reliable results.     ALLERGIES: No Allergies    MEDICATIONS: Diazepam 10 mg tablet Take 10 mg 30-60 minutes prior to your procedure  Levofloxacin 750 mg tablet Please take one tablet the morning of your biopsy.  Nexium  Viagra  Fiber  Multiple Vitamin  Pepcid     GU PSH: Prostate Needle Biopsy - 09/14/2020     NON-GU PSH: Surgical Pathology, Gross And Microscopic Examination For Prostate Needle - 09/14/2020     GU PMH: Elevated PSA - 09/14/2020, - 08/04/2020, - 03/29/2020 Hematospermia - 03/29/2020      PMH Notes:   1) Elevated PSA: He presented to me in January 2022 with hematospermia and an elevated PSA of 4.3 This was repeated and was 3.9. I offered him a biopsy at that time. He elected to first recheck his PSA later in the year to see if it would decrease as it was felt that he may have had prostatitis due to his hematospermia. He has no family history of prostate cancer but his mother died of metastatic breast cancer at age 83.    NON-GU PMH: Acute gastric ulcer with  hemorrhage Anxiety Arthritis Depression GERD Hypercholesterolemia Hypertension    FAMILY HISTORY: Breast Cancer - Mother Lung Cancer - Father   SOCIAL HISTORY: Marital Status: Married Preferred Language: English; Ethnicity: Not Hispanic Or Latino; Race: White Current Smoking Status: Patient has never smoked.   Tobacco Use Assessment Completed: Used Tobacco in last 30 days? Types of alcohol consumed: Wine.  Drinks 2 caffeinated drinks per day.    REVIEW OF SYSTEMS:    GU Review Male:   Patient denies frequent urination, hard to postpone urination, burning/ pain with urination, get up at night to urinate, leakage of urine, stream starts and stops, trouble starting your streams, and have to strain to urinate .  Gastrointestinal (Lower):   Patient denies diarrhea and constipation.  Gastrointestinal (Upper):   Patient denies nausea and vomiting.  Constitutional:   Patient denies fever, night sweats, weight loss, and fatigue.  Skin:   Patient denies skin rash/ lesion and itching.  Eyes:   Patient denies blurred vision and double vision.  Ears/ Nose/ Throat:   Patient denies sore throat and sinus problems.  Hematologic/Lymphatic:   Patient denies swollen glands and easy bruising.  Cardiovascular:   Patient denies leg swelling and chest pains.  Respiratory:   Patient denies cough and shortness of breath.  Endocrine:   Patient denies excessive thirst.  Musculoskeletal:   Patient denies back pain and  joint pain.  Neurological:   Patient denies headaches and dizziness.  Psychologic:   Patient denies depression and anxiety.    Weight 155 lb / 70.31 kg  Height 67 in / 170.18 cm  BMI 24.3 kg/m   MULTI-SYSTEM PHYSICAL EXAMINATION:    Constitutional: Well-nourished. No physical deformities. Normally developed. Good grooming.  Respiratory: No labored breathing, no use of accessory muscles. Clear bilaterally.  Cardiovascular: Normal temperature, normal extremity pulses, no swelling, no  varicosities. RRR.  Gastrointestinal: No mass, no tenderness, no rigidity, non obese abdomen.       ASSESSMENT:      ICD-10 Details  1 GU:   Prostate Cancer - C61    PLAN:       1. Unfavorable intermediate risk prostate cancer: I had a long discussion with James Barry and his husband today. The patient was counseled about the natural history of prostate cancer and the standard treatment options that are available for prostate cancer. It was explained to him how his age and life expectancy, clinical stage, Gleason score, and PSA affect his prognosis, the decision to proceed with additional staging studies, as well as how that information influences recommended treatment strategies. We discussed the roles for active surveillance, radiation therapy, surgical therapy, androgen deprivation, as well as ablative therapy options for the treatment of prostate cancer as appropriate to his individual cancer situation. We discussed the risks and benefits of these options with regard to their impact on cancer control and also in terms of potential adverse events, complications, and impact on quality of life particularly related to urinary and sexual function. The patient was encouraged to ask questions throughout the discussion today and all questions were answered to his stated satisfaction. In addition, the patient was provided with and/or directed to appropriate resources and literature for further education about prostate cancer and treatment options.   We discussed surgical therapy for prostate cancer including the different available surgical approaches. We discussed, in detail, the risks and expectations of surgery with regard to cancer control, urinary control, and erectile function as well as the expected postoperative recovery process. Additional risks of surgery including but not limited to bleeding, infection, hernia formation, nerve damage, lymphocele formation, bowel/rectal injury potentially necessitating  colostomy, damage to the urinary tract resulting in urine leakage, urethral stricture, and the cardiopulmonary risks such as myocardial infarction, stroke, death, venothromboembolism, etc. were explained. The risk of open surgical conversion for robotic/laparoscopic prostatectomy was also discussed.   I did recommend staging imaging consistent with NCCN guidelines for unfavorable intermediate risk prostate cancer. He also does have some moderate mid back pain. He will be scheduled for a PSMA PET scan for further evaluation. He also will be scheduled for a radiation oncology consultation with Dr. Tammi Klippel.   Currently, he appears to be leaning toward surgical therapy and my tentative plan would be to perform a BNS RAL radical prostatectomy and BPLND. He will notify me once he has made a decision about treatment.    James Barry since made the decision to proceed with surgical therapy.  His PSMA PET scan was negative.

## 2020-12-01 NOTE — Anesthesia Preprocedure Evaluation (Addendum)
Anesthesia Evaluation  Patient identified by MRN, date of birth, ID band Patient awake    Reviewed: Allergy & Precautions, NPO status , Patient's Chart, lab work & pertinent test results  Airway Mallampati: II  TM Distance: >3 FB Neck ROM: Full    Dental no notable dental hx. (+) Teeth Intact, Dental Advisory Given,    Pulmonary  COVID + 10/07/20   Pulmonary exam normal breath sounds clear to auscultation       Cardiovascular hypertension (hypertensive in PAT 140s-150s/100 2/2 anxiety, no known hx HTN. 156/99), Normal cardiovascular exam Rhythm:Regular Rate:Normal     Neuro/Psych negative neurological ROS  negative psych ROS   GI/Hepatic Neg liver ROS, PUD, GERD  Medicated and Controlled,  Endo/Other  negative endocrine ROS  Renal/GU negative Renal ROS   Prostate ca    Musculoskeletal  (+) Arthritis , Osteoarthritis,    Abdominal   Peds  Hematology negative hematology ROS (+) hct 41.9   Anesthesia Other Findings   Reproductive/Obstetrics negative OB ROS                            Anesthesia Physical Anesthesia Plan  ASA: 2  Anesthesia Plan: General   Post-op Pain Management:    Induction: Intravenous  PONV Risk Score and Plan: 2 and Ondansetron, Dexamethasone, Midazolam and Treatment may vary due to age or medical condition  Airway Management Planned: Oral ETT  Additional Equipment: None  Intra-op Plan:   Post-operative Plan: Extubation in OR  Informed Consent: I have reviewed the patients History and Physical, chart, labs and discussed the procedure including the risks, benefits and alternatives for the proposed anesthesia with the patient or authorized representative who has indicated his/her understanding and acceptance.     Dental advisory given  Plan Discussed with: CRNA  Anesthesia Plan Comments: (Type and screen)       Anesthesia Quick Evaluation

## 2020-12-04 ENCOUNTER — Encounter (HOSPITAL_COMMUNITY)
Admission: RE | Disposition: A | Discharge status: Home / Self Care | Payer: Self-pay | Source: Home / Self Care | Attending: Urology

## 2020-12-04 ENCOUNTER — Other Ambulatory Visit (HOSPITAL_COMMUNITY): Payer: Self-pay

## 2020-12-04 ENCOUNTER — Encounter (HOSPITAL_COMMUNITY): Payer: Self-pay | Admitting: Urology

## 2020-12-04 ENCOUNTER — Ambulatory Visit (HOSPITAL_COMMUNITY): Payer: 59 | Admitting: Physician Assistant

## 2020-12-04 ENCOUNTER — Other Ambulatory Visit: Payer: Self-pay

## 2020-12-04 ENCOUNTER — Observation Stay (HOSPITAL_COMMUNITY)
Admission: RE | Admit: 2020-12-04 | Discharge: 2020-12-05 | Disposition: A | Discharge status: Home / Self Care | Payer: 59 | Attending: Urology | Admitting: Urology

## 2020-12-04 ENCOUNTER — Ambulatory Visit (HOSPITAL_COMMUNITY): Payer: 59 | Admitting: Anesthesiology

## 2020-12-04 DIAGNOSIS — I10 Essential (primary) hypertension: Secondary | ICD-10-CM | POA: Insufficient documentation

## 2020-12-04 DIAGNOSIS — N529 Male erectile dysfunction, unspecified: Secondary | ICD-10-CM | POA: Diagnosis not present

## 2020-12-04 DIAGNOSIS — C61 Malignant neoplasm of prostate: Secondary | ICD-10-CM | POA: Diagnosis not present

## 2020-12-04 DIAGNOSIS — Z79899 Other long term (current) drug therapy: Secondary | ICD-10-CM | POA: Insufficient documentation

## 2020-12-04 DIAGNOSIS — Z8546 Personal history of malignant neoplasm of prostate: Secondary | ICD-10-CM | POA: Diagnosis present

## 2020-12-04 DIAGNOSIS — D360 Benign neoplasm of lymph nodes: Secondary | ICD-10-CM | POA: Diagnosis not present

## 2020-12-04 DIAGNOSIS — K219 Gastro-esophageal reflux disease without esophagitis: Secondary | ICD-10-CM | POA: Diagnosis not present

## 2020-12-04 HISTORY — PX: ROBOT ASSISTED LAPAROSCOPIC RADICAL PROSTATECTOMY: SHX5141

## 2020-12-04 HISTORY — PX: LYMPHADENECTOMY: SHX5960

## 2020-12-04 LAB — ABO/RH: ABO/RH(D): O POS

## 2020-12-04 LAB — TYPE AND SCREEN
ABO/RH(D): O POS
Antibody Screen: NEGATIVE

## 2020-12-04 LAB — HEMOGLOBIN AND HEMATOCRIT, BLOOD
HCT: 36.6 % — ABNORMAL LOW (ref 39.0–52.0)
Hemoglobin: 12 g/dL — ABNORMAL LOW (ref 13.0–17.0)

## 2020-12-04 SURGERY — XI ROBOTIC ASSISTED LAPAROSCOPIC RADICAL PROSTATECTOMY LEVEL 2
Anesthesia: General

## 2020-12-04 MED ORDER — FENTANYL CITRATE (PF) 100 MCG/2ML IJ SOLN
INTRAMUSCULAR | Status: AC
  Filled 2020-12-04: qty 2

## 2020-12-04 MED ORDER — DEXAMETHASONE SODIUM PHOSPHATE 10 MG/ML IJ SOLN
INTRAMUSCULAR | Status: DC | PRN
  Administered 2020-12-04: 5 mg via INTRAVENOUS

## 2020-12-04 MED ORDER — ALBUMIN HUMAN 5 % IV SOLN
INTRAVENOUS | Status: DC | PRN
Start: 2020-12-04 — End: 2020-12-04

## 2020-12-04 MED ORDER — PROPOFOL 10 MG/ML IV BOLUS
INTRAVENOUS | Status: AC
  Filled 2020-12-04: qty 20

## 2020-12-04 MED ORDER — ROCURONIUM BROMIDE 10 MG/ML (PF) SYRINGE
PREFILLED_SYRINGE | INTRAVENOUS | Status: AC
  Filled 2020-12-04: qty 10

## 2020-12-04 MED ORDER — ONDANSETRON HCL 4 MG/2ML IJ SOLN
INTRAMUSCULAR | Status: AC
  Filled 2020-12-04: qty 2

## 2020-12-04 MED ORDER — CEFAZOLIN SODIUM-DEXTROSE 2-4 GM/100ML-% IV SOLN
2.0000 g | Freq: Once | INTRAVENOUS | Status: AC
  Administered 2020-12-04: 2 g via INTRAVENOUS

## 2020-12-04 MED ORDER — ALUM & MAG HYDROXIDE-SIMETH 200-200-20 MG/5ML PO SUSP
30.0000 mL | ORAL | Status: DC | PRN
  Administered 2020-12-05: 30 mL via ORAL
  Filled 2020-12-04: qty 30

## 2020-12-04 MED ORDER — FENTANYL CITRATE (PF) 100 MCG/2ML IJ SOLN
INTRAMUSCULAR | Status: DC | PRN
  Administered 2020-12-04: 50 ug via INTRAVENOUS
  Administered 2020-12-04 (×2): 25 ug via INTRAVENOUS
  Administered 2020-12-04: 75 ug via INTRAVENOUS
  Administered 2020-12-04: 25 ug via INTRAVENOUS

## 2020-12-04 MED ORDER — HYDROMORPHONE HCL 1 MG/ML IJ SOLN
INTRAMUSCULAR | Status: AC
  Filled 2020-12-04: qty 1

## 2020-12-04 MED ORDER — PROMETHAZINE HCL 25 MG/ML IJ SOLN: 6.2500 mg | INTRAMUSCULAR | Status: DC | PRN

## 2020-12-04 MED ORDER — EMTRICITABINE-TENOFOVIR AF 200-25 MG PO TABS
1.0000 | ORAL_TABLET | Freq: Every day | ORAL | Status: DC
  Administered 2020-12-05: 1 via ORAL
  Filled 2020-12-04: qty 1

## 2020-12-04 MED ORDER — ACETAMINOPHEN 325 MG PO TABS
650.0000 mg | ORAL_TABLET | ORAL | Status: DC | PRN
  Administered 2020-12-04 – 2020-12-05 (×2): 650 mg via ORAL
  Filled 2020-12-04: qty 2

## 2020-12-04 MED ORDER — BELLADONNA ALKALOIDS-OPIUM 16.2-60 MG RE SUPP
1.0000 | Freq: Four times a day (QID) | RECTAL | Status: DC | PRN
  Administered 2020-12-04 (×2): 1 via RECTAL
  Filled 2020-12-04: qty 1

## 2020-12-04 MED ORDER — BELLADONNA ALKALOIDS-OPIUM 16.2-60 MG RE SUPP
RECTAL | Status: AC
  Filled 2020-12-04: qty 1

## 2020-12-04 MED ORDER — STERILE WATER FOR IRRIGATION IR SOLN
Status: DC | PRN
  Administered 2020-12-04: 1000 mL

## 2020-12-04 MED ORDER — MIDAZOLAM HCL 2 MG/2ML IJ SOLN
INTRAMUSCULAR | Status: AC
  Filled 2020-12-04: qty 4

## 2020-12-04 MED ORDER — ONDANSETRON HCL 4 MG/2ML IJ SOLN
INTRAMUSCULAR | Status: DC | PRN
  Administered 2020-12-04: 4 mg via INTRAVENOUS

## 2020-12-04 MED ORDER — HYDROMORPHONE HCL 1 MG/ML IJ SOLN: 0.5000 mg | INTRAMUSCULAR | Status: DC | PRN

## 2020-12-04 MED ORDER — LIDOCAINE HCL (PF) 2 % IJ SOLN
INTRAMUSCULAR | Status: AC
  Filled 2020-12-04: qty 5

## 2020-12-04 MED ORDER — DEXAMETHASONE SODIUM PHOSPHATE 10 MG/ML IJ SOLN
INTRAMUSCULAR | Status: AC
  Filled 2020-12-04: qty 1

## 2020-12-04 MED ORDER — AMISULPRIDE (ANTIEMETIC) 5 MG/2ML IV SOLN: 10.0000 mg | Freq: Once | INTRAVENOUS | Status: DC | PRN

## 2020-12-04 MED ORDER — PHENYLEPHRINE 40 MCG/ML (10ML) SYRINGE FOR IV PUSH (FOR BLOOD PRESSURE SUPPORT)
PREFILLED_SYRINGE | INTRAVENOUS | Status: AC
  Filled 2020-12-04: qty 20

## 2020-12-04 MED ORDER — KETOROLAC TROMETHAMINE 15 MG/ML IJ SOLN
15.0000 mg | Freq: Four times a day (QID) | INTRAMUSCULAR | Status: DC
  Administered 2020-12-04 – 2020-12-05 (×5): 15 mg via INTRAVENOUS
  Filled 2020-12-04 (×4): qty 1

## 2020-12-04 MED ORDER — LACTATED RINGERS IV SOLN: INTRAVENOUS | Status: DC

## 2020-12-04 MED ORDER — DOCUSATE SODIUM 100 MG PO CAPS: 100.0000 mg | ORAL_CAPSULE | Freq: Two times a day (BID) | ORAL | Status: AC

## 2020-12-04 MED ORDER — LIDOCAINE 2% (20 MG/ML) 5 ML SYRINGE
INTRAMUSCULAR | Status: DC | PRN
  Administered 2020-12-04: 60 mg via INTRAVENOUS

## 2020-12-04 MED ORDER — PHENYLEPHRINE HCL-NACL 20-0.9 MG/250ML-% IV SOLN
INTRAVENOUS | Status: DC | PRN
  Administered 2020-12-04: 40 ug/min via INTRAVENOUS

## 2020-12-04 MED ORDER — BACITRACIN-NEOMYCIN-POLYMYXIN 400-5-5000 EX OINT: 1.0000 "application " | TOPICAL_OINTMENT | Freq: Three times a day (TID) | CUTANEOUS | Status: DC | PRN

## 2020-12-04 MED ORDER — KETAMINE HCL 10 MG/ML IJ SOLN
INTRAMUSCULAR | Status: AC
  Filled 2020-12-04: qty 1

## 2020-12-04 MED ORDER — SODIUM CHLORIDE 0.9 % IV BOLUS
1000.0000 mL | Freq: Once | INTRAVENOUS | Status: AC
  Administered 2020-12-04: 1000 mL via INTRAVENOUS

## 2020-12-04 MED ORDER — SULFAMETHOXAZOLE-TRIMETHOPRIM 800-160 MG PO TABS
1.0000 | ORAL_TABLET | Freq: Two times a day (BID) | ORAL | 0 refills | Status: DC
  Filled 2020-12-04: qty 6, 3d supply, fill #0

## 2020-12-04 MED ORDER — KETOROLAC TROMETHAMINE 15 MG/ML IJ SOLN
INTRAMUSCULAR | Status: AC
  Filled 2020-12-04: qty 1

## 2020-12-04 MED ORDER — OXYCODONE HCL 5 MG PO TABS
5.0000 mg | ORAL_TABLET | Freq: Once | ORAL | Status: AC | PRN
  Administered 2020-12-04: 5 mg via ORAL

## 2020-12-04 MED ORDER — ACETAMINOPHEN 500 MG PO TABS
ORAL_TABLET | ORAL | Status: AC
  Filled 2020-12-04: qty 2

## 2020-12-04 MED ORDER — LACTATED RINGERS IV SOLN: INTRAVENOUS | Status: DC | PRN

## 2020-12-04 MED ORDER — ROCURONIUM BROMIDE 10 MG/ML (PF) SYRINGE
PREFILLED_SYRINGE | INTRAVENOUS | Status: DC | PRN
  Administered 2020-12-04: 100 mg via INTRAVENOUS

## 2020-12-04 MED ORDER — MAGNESIUM CITRATE PO SOLN: 1.0000 | Freq: Once | ORAL | Status: DC

## 2020-12-04 MED ORDER — DIPHENHYDRAMINE HCL 12.5 MG/5ML PO ELIX: 12.5000 mg | ORAL_SOLUTION | Freq: Four times a day (QID) | ORAL | Status: DC | PRN

## 2020-12-04 MED ORDER — PHENYLEPHRINE 40 MCG/ML (10ML) SYRINGE FOR IV PUSH (FOR BLOOD PRESSURE SUPPORT)
PREFILLED_SYRINGE | INTRAVENOUS | Status: AC
  Filled 2020-12-04: qty 10

## 2020-12-04 MED ORDER — BUPIVACAINE-EPINEPHRINE 0.25% -1:200000 IJ SOLN
INTRAMUSCULAR | Status: DC | PRN
  Administered 2020-12-04: 30 mL

## 2020-12-04 MED ORDER — ONDANSETRON HCL 4 MG/2ML IJ SOLN
4.0000 mg | INTRAMUSCULAR | Status: DC | PRN
  Administered 2020-12-04: 4 mg via INTRAVENOUS
  Filled 2020-12-04: qty 2

## 2020-12-04 MED ORDER — ACETAMINOPHEN 500 MG PO TABS
1000.0000 mg | ORAL_TABLET | Freq: Once | ORAL | Status: DC
  Filled 2020-12-04: qty 2

## 2020-12-04 MED ORDER — DEXTROSE-NACL 5-0.45 % IV SOLN: INTRAVENOUS | Status: DC

## 2020-12-04 MED ORDER — MIDAZOLAM HCL 5 MG/5ML IJ SOLN
INTRAMUSCULAR | Status: DC | PRN
Start: 2020-12-04 — End: 2020-12-04
  Administered 2020-12-04 (×2): 2 mg via INTRAVENOUS

## 2020-12-04 MED ORDER — OXYCODONE HCL 5 MG/5ML PO SOLN: 5.0000 mg | Freq: Once | ORAL | Status: AC | PRN

## 2020-12-04 MED ORDER — HEPARIN SODIUM (PORCINE) 1000 UNIT/ML IJ SOLN
INTRAMUSCULAR | Status: AC
  Filled 2020-12-04: qty 1

## 2020-12-04 MED ORDER — ACETAMINOPHEN 325 MG PO TABS
ORAL_TABLET | ORAL | Status: AC
  Filled 2020-12-04: qty 1

## 2020-12-04 MED ORDER — KETAMINE HCL 10 MG/ML IJ SOLN
INTRAMUSCULAR | Status: DC | PRN
  Administered 2020-12-04: 35 mg via INTRAVENOUS

## 2020-12-04 MED ORDER — DOCUSATE SODIUM 100 MG PO CAPS
100.0000 mg | ORAL_CAPSULE | Freq: Two times a day (BID) | ORAL | Status: DC
  Administered 2020-12-04 – 2020-12-05 (×2): 100 mg via ORAL
  Filled 2020-12-04 (×2): qty 1

## 2020-12-04 MED ORDER — TRAMADOL HCL 50 MG PO TABS
50.0000 mg | ORAL_TABLET | Freq: Four times a day (QID) | ORAL | 0 refills | Status: DC | PRN
  Filled 2020-12-04: qty 20, 3d supply, fill #0

## 2020-12-04 MED ORDER — FLEET ENEMA 7-19 GM/118ML RE ENEM: 1.0000 | ENEMA | Freq: Once | RECTAL | Status: DC

## 2020-12-04 MED ORDER — SODIUM CHLORIDE 0.9 % IR SOLN
Status: DC | PRN
  Administered 2020-12-04: 1000 mL

## 2020-12-04 MED ORDER — OXYCODONE HCL 5 MG PO TABS
ORAL_TABLET | ORAL | Status: AC
  Filled 2020-12-04: qty 1

## 2020-12-04 MED ORDER — CHLORHEXIDINE GLUCONATE 0.12 % MT SOLN
15.0000 mL | Freq: Once | OROMUCOSAL | Status: AC
  Administered 2020-12-04: 15 mL via OROMUCOSAL

## 2020-12-04 MED ORDER — CHLORHEXIDINE GLUCONATE CLOTH 2 % EX PADS
6.0000 | MEDICATED_PAD | Freq: Every day | CUTANEOUS | Status: DC
  Administered 2020-12-04 – 2020-12-05 (×2): 6 via TOPICAL

## 2020-12-04 MED ORDER — BUPIVACAINE-EPINEPHRINE (PF) 0.25% -1:200000 IJ SOLN
INTRAMUSCULAR | Status: AC
  Filled 2020-12-04: qty 30

## 2020-12-04 MED ORDER — HYDROMORPHONE HCL 1 MG/ML IJ SOLN
0.2500 mg | INTRAMUSCULAR | Status: DC | PRN
  Administered 2020-12-04 (×4): 0.5 mg via INTRAVENOUS

## 2020-12-04 MED ORDER — ORAL CARE MOUTH RINSE: 15.0000 mL | Freq: Once | OROMUCOSAL | Status: AC

## 2020-12-04 MED ORDER — PHENYLEPHRINE 40 MCG/ML (10ML) SYRINGE FOR IV PUSH (FOR BLOOD PRESSURE SUPPORT)
PREFILLED_SYRINGE | INTRAVENOUS | Status: DC | PRN
  Administered 2020-12-04 (×2): 80 ug via INTRAVENOUS
  Administered 2020-12-04 (×3): 120 ug via INTRAVENOUS

## 2020-12-04 MED ORDER — OXYCODONE HCL 5 MG PO TABS
5.0000 mg | ORAL_TABLET | ORAL | Status: DC | PRN
  Administered 2020-12-04 – 2020-12-05 (×3): 5 mg via ORAL
  Filled 2020-12-04 (×3): qty 1

## 2020-12-04 MED ORDER — PHENYLEPHRINE HCL (PRESSORS) 10 MG/ML IV SOLN
INTRAVENOUS | Status: AC
  Filled 2020-12-04: qty 2

## 2020-12-04 MED ORDER — PROPOFOL 10 MG/ML IV BOLUS
INTRAVENOUS | Status: DC | PRN
  Administered 2020-12-04: 150 mg via INTRAVENOUS

## 2020-12-04 MED ORDER — SUGAMMADEX SODIUM 200 MG/2ML IV SOLN
INTRAVENOUS | Status: DC | PRN
  Administered 2020-12-04: 200 mg via INTRAVENOUS

## 2020-12-04 MED ORDER — CEFAZOLIN SODIUM-DEXTROSE 2-4 GM/100ML-% IV SOLN
INTRAVENOUS | Status: AC
  Filled 2020-12-04: qty 100

## 2020-12-04 MED ORDER — DIPHENHYDRAMINE HCL 50 MG/ML IJ SOLN: 12.5000 mg | Freq: Four times a day (QID) | INTRAMUSCULAR | Status: DC | PRN

## 2020-12-04 MED ORDER — PANTOPRAZOLE SODIUM 40 MG PO TBEC
40.0000 mg | DELAYED_RELEASE_TABLET | Freq: Every day | ORAL | Status: DC
  Administered 2020-12-05: 40 mg via ORAL
  Filled 2020-12-04: qty 1

## 2020-12-04 SURGICAL SUPPLY — 60 items
APPLICATOR COTTON TIP 6 STRL (MISCELLANEOUS) ×2 IMPLANT
APPLICATOR COTTON TIP 6IN STRL (MISCELLANEOUS) ×3
BAG COUNTER SPONGE SURGICOUNT (BAG) IMPLANT
CATH FOLEY 2WAY SLVR 18FR 30CC (CATHETERS) ×3 IMPLANT
CATH FOLEY 2WAY SLVR 30CC 16FR (CATHETERS) ×3 IMPLANT
CATH ROBINSON RED A/P 16FR (CATHETERS) ×3 IMPLANT
CATH ROBINSON RED A/P 8FR (CATHETERS) ×3 IMPLANT
CATH TIEMANN FOLEY 18FR 5CC (CATHETERS) ×3 IMPLANT
CHLORAPREP W/TINT 26 (MISCELLANEOUS) ×3 IMPLANT
CLIP LIGATING HEM O LOK PURPLE (MISCELLANEOUS) ×6 IMPLANT
COVER SURGICAL LIGHT HANDLE (MISCELLANEOUS) ×3 IMPLANT
COVER TIP SHEARS 8 DVNC (MISCELLANEOUS) ×2 IMPLANT
COVER TIP SHEARS 8MM DA VINCI (MISCELLANEOUS) ×1
CUTTER ECHEON FLEX ENDO 45 340 (ENDOMECHANICALS) ×3 IMPLANT
DECANTER SPIKE VIAL GLASS SM (MISCELLANEOUS) ×3 IMPLANT
DERMABOND ADVANCED (GAUZE/BANDAGES/DRESSINGS) ×1
DERMABOND ADVANCED .7 DNX12 (GAUZE/BANDAGES/DRESSINGS) ×2 IMPLANT
DRAIN CHANNEL RND F F (WOUND CARE) IMPLANT
DRAPE ARM DVNC X/XI (DISPOSABLE) ×8 IMPLANT
DRAPE COLUMN DVNC XI (DISPOSABLE) ×2 IMPLANT
DRAPE DA VINCI XI ARM (DISPOSABLE) ×4
DRAPE DA VINCI XI COLUMN (DISPOSABLE) ×1
DRAPE SURG IRRIG POUCH 19X23 (DRAPES) ×3 IMPLANT
DRSG TEGADERM 4X4.75 (GAUZE/BANDAGES/DRESSINGS) ×3 IMPLANT
ELECT PENCIL ROCKER SW 15FT (MISCELLANEOUS) ×3 IMPLANT
ELECT REM PT RETURN 15FT ADLT (MISCELLANEOUS) ×3 IMPLANT
GLOVE SURG ENC MOIS LTX SZ6.5 (GLOVE) ×3 IMPLANT
GLOVE SURG ENC TEXT LTX SZ7.5 (GLOVE) ×6 IMPLANT
GOWN STRL REUS W/TWL LRG LVL3 (GOWN DISPOSABLE) ×9 IMPLANT
HEMOSTAT SURGICEL 2X3 (HEMOSTASIS) ×3 IMPLANT
HOLDER FOLEY CATH W/STRAP (MISCELLANEOUS) ×3 IMPLANT
IRRIG SUCT STRYKERFLOW 2 WTIP (MISCELLANEOUS) ×3
IRRIGATION SUCT STRKRFLW 2 WTP (MISCELLANEOUS) ×2 IMPLANT
IV LACTATED RINGERS 1000ML (IV SOLUTION) ×3 IMPLANT
KIT TURNOVER KIT A (KITS) ×3 IMPLANT
NDL SAFETY ECLIPSE 18X1.5 (NEEDLE) ×2 IMPLANT
NEEDLE HYPO 18GX1.5 SHARP (NEEDLE) ×1
PACK ROBOT UROLOGY CUSTOM (CUSTOM PROCEDURE TRAY) ×3 IMPLANT
PENCIL SMOKE EVACUATOR (MISCELLANEOUS) IMPLANT
SEAL CANN UNIV 5-8 DVNC XI (MISCELLANEOUS) ×8 IMPLANT
SEAL XI 5MM-8MM UNIVERSAL (MISCELLANEOUS) ×4
SET TUBE SMOKE EVAC HIGH FLOW (TUBING) ×3 IMPLANT
SOLUTION ELECTROLUBE (MISCELLANEOUS) ×3 IMPLANT
STAPLE RELOAD 45 GRN (STAPLE) ×2 IMPLANT
STAPLE RELOAD 45MM GREEN (STAPLE) ×1
SUT ETHILON 3 0 PS 1 (SUTURE) ×3 IMPLANT
SUT MNCRL 3 0 RB1 (SUTURE) ×2 IMPLANT
SUT MNCRL 3 0 VIOLET RB1 (SUTURE) ×2 IMPLANT
SUT MNCRL AB 4-0 PS2 18 (SUTURE) ×6 IMPLANT
SUT MONOCRYL 3 0 RB1 (SUTURE) ×2
SUT VIC AB 0 CT1 27 (SUTURE) ×1
SUT VIC AB 0 CT1 27XBRD ANTBC (SUTURE) ×2 IMPLANT
SUT VIC AB 0 UR5 27 (SUTURE) ×3 IMPLANT
SUT VIC AB 2-0 SH 27 (SUTURE) ×1
SUT VIC AB 2-0 SH 27X BRD (SUTURE) ×2 IMPLANT
SUT VICRYL 0 UR6 27IN ABS (SUTURE) ×6 IMPLANT
SYR 27GX1/2 1ML LL SAFETY (SYRINGE) ×3 IMPLANT
TOWEL OR NON WOVEN STRL DISP B (DISPOSABLE) ×3 IMPLANT
TROCAR XCEL NON-BLD 5MMX100MML (ENDOMECHANICALS) IMPLANT
WATER STERILE IRR 1000ML POUR (IV SOLUTION) ×3 IMPLANT

## 2020-12-04 NOTE — Anesthesia Procedure Notes (Signed)
Procedure Name: Intubation Date/Time: 12/04/2020 7:25 AM Performed by: Rosaland Lao, CRNA Pre-anesthesia Checklist: Patient identified, Emergency Drugs available, Suction available and Patient being monitored Patient Re-evaluated:Patient Re-evaluated prior to induction Oxygen Delivery Method: Circle system utilized Preoxygenation: Pre-oxygenation with 100% oxygen Induction Type: IV induction Ventilation: Mask ventilation without difficulty Laryngoscope Size: Miller and 2 Grade View: Grade II Tube type: Oral Tube size: 7.0 mm Number of attempts: 1 Airway Equipment and Method: Stylet Placement Confirmation: ETT inserted through vocal cords under direct vision, positive ETCO2 and breath sounds checked- equal and bilateral Secured at: 22 cm Tube secured with: Tape Dental Injury: Teeth and Oropharynx as per pre-operative assessment

## 2020-12-04 NOTE — Plan of Care (Signed)
Plan of care reviewed with pt and significant other at bedside. Pt up out of bed with assistance and ambulated in the hall x 2. Pt verbalizes adequate control throughout shift with PRN medications. Pt c/o nausea with vomiting x2 - PRN zofran given. Pt dressings clean dry and intact. Pt foley with adequate UOP. Pt safety maintained.   Problem: Education: Goal: Knowledge of General Education information will improve Description: Including pain rating scale, medication(s)/side effects and non-pharmacologic comfort measures Outcome: Progressing   Problem: Health Behavior/Discharge Planning: Goal: Ability to manage health-related needs will improve Outcome: Progressing   Problem: Clinical Measurements: Goal: Ability to maintain clinical measurements within normal limits will improve Outcome: Progressing Goal: Will remain free from infection Outcome: Progressing Goal: Diagnostic test results will improve Outcome: Progressing Goal: Respiratory complications will improve Outcome: Progressing Goal: Cardiovascular complication will be avoided Outcome: Progressing   Problem: Activity: Goal: Risk for activity intolerance will decrease Outcome: Progressing   Problem: Nutrition: Goal: Adequate nutrition will be maintained Outcome: Progressing   Problem: Coping: Goal: Level of anxiety will decrease Outcome: Progressing   Problem: Elimination: Goal: Will not experience complications related to bowel motility Outcome: Progressing Goal: Will not experience complications related to urinary retention Outcome: Progressing   Problem: Pain Managment: Goal: General experience of comfort will improve Outcome: Progressing   Problem: Safety: Goal: Ability to remain free from injury will improve Outcome: Progressing   Problem: Skin Integrity: Goal: Risk for impaired skin integrity will decrease Outcome: Progressing

## 2020-12-04 NOTE — Discharge Instructions (Signed)

## 2020-12-04 NOTE — Transfer of Care (Signed)
Immediate Anesthesia Transfer of Care Note  Patient: James Barry  Procedure(s) Performed: XI ROBOTIC ASSISTED LAPAROSCOPIC RADICAL PROSTATECTOMY LEVEL 2 LYMPHADENECTOMY, PELVIC (Bilateral)  Patient Location: PACU  Anesthesia Type:General  Level of Consciousness: awake, alert  and oriented  Airway & Oxygen Therapy: Patient Spontanous Breathing and Patient connected to face mask  Post-op Assessment: Report given to RN and Post -op Vital signs reviewed and stable  Post vital signs: Reviewed and stable  Last Vitals:  Vitals Value Taken Time  BP 108/43 12/04/20 0945  Temp    Pulse 94 12/04/20 0950  Resp 18 12/04/20 0950  SpO2 92 % 12/04/20 0950  Vitals shown include unvalidated device data.  Last Pain:  Vitals:   12/04/20 0613  TempSrc: Oral  PainSc:          Complications: No notable events documented.

## 2020-12-04 NOTE — Progress Notes (Signed)
Patient ID: James Barry, male   DOB: 1955-07-14, 65 y.o.   MRN: [id number]  .Post-op note  Subjective: The patient is doing well.  No complaints except mild bladder spasms.  Objective: Vital signs in last 24 hours: Temp:  [97.5 F (36.4 C)-97.8 F (36.6 C)] 97.8 F (36.6 C) (09/19 1200) Pulse Rate:  [77-99] 89 (09/19 1400) Resp:  [11-22] 18 (09/19 1400) BP: (108-156)/(43-100) 138/100 (09/19 1400) SpO2:  [90 %-99 %] 97 % (09/19 1400) Weight:  [70.9 kg] 70.9 kg (09/19 0610)  Intake/Output from previous day: No intake/output data recorded. Intake/Output this shift: Total I/O In: 1750 [I.V.:1500; IV Piggyback:250] Out: 850 [Urine:600; Drains:150; Blood:100]  Physical Exam:  General: Alert and oriented. Abdomen: Soft, Nondistended. Incisions: Clean and dry. Urine: red tinged  Lab Results: Recent Labs    12/04/20 0956  HGB 12.0*  HCT 36.6*    Assessment/Plan: POD#0   1) Continue to monitor  2) DVT prophy, clears, IS, amb, pain control  3) B/O for spasms   LOS: 0 days   Debbrah Alar 12/04/2020, 3:03 PM

## 2020-12-04 NOTE — Op Note (Signed)
Preoperative diagnosis: Clinically localized adenocarcinoma of the prostate (clinical stage T1c N0 M0)  Postoperative diagnosis: Clinically localized adenocarcinoma of the prostate (clinical stage T1c N0 M0)  Procedure:  Robotic assisted laparoscopic radical prostatectomy (bilateral nerve sparing) Bilateral robotic assisted laparoscopic pelvic lymphadenectomy  Surgeon: Pryor Curia. M.D.  Assistant: Debbrah Alar, PA-C  An assistant was required for this surgical procedure.  The duties of the assistant included but were not limited to suctioning, passing suture, camera manipulation, retraction. This procedure would not be able to be performed without an Environmental consultant.  Anesthesia: General  Complications: None  EBL: 100 mL  IVF:  1200 mL crystalloid  Specimens: Prostate and seminal vesicles Right pelvic lymph nodes Left pelvic lymph nodes  Disposition of specimens: Pathology  Drains: 20 Fr coude catheter # 19 Blake pelvic drain  Indication: James Barry is a 65 y.o. year old patient with clinically localized prostate cancer.  After a thorough review of the management options for treatment of prostate cancer, he elected to proceed with surgical therapy and the above procedure(s).  We have discussed the potential benefits and risks of the procedure, side effects of the proposed treatment, the likelihood of the patient achieving the goals of the procedure, and any potential problems that might occur during the procedure or recuperation. Informed consent has been obtained.  Description of procedure:  The patient was taken to the operating room and a general anesthetic was administered. He was given preoperative antibiotics, placed in the dorsal lithotomy position, and prepped and draped in the usual sterile fashion. Next a preoperative timeout was performed. A urethral catheter was placed into the bladder and a site was selected near the umbilicus for placement of the camera  port. This was placed using a standard open Hassan technique which allowed entry into the peritoneal cavity under direct vision and without difficulty. An 8 mm robotic port was placed and a pneumoperitoneum established. The camera was then used to inspect the abdomen and there was no evidence of any intra-abdominal injuries or other abnormalities. The remaining abdominal ports were then placed. 8 mm robotic ports were placed in the right lower quadrant, left lower quadrant, and far left lateral abdominal wall. A 5 mm port was placed in the right upper quadrant and a 12 mm port was placed in the right lateral abdominal wall for laparoscopic assistance. All ports were placed under direct vision without difficulty. The surgical cart was then docked.   Utilizing the cautery scissors, the bladder was reflected posteriorly allowing entry into the space of Retzius and identification of the endopelvic fascia and prostate. The periprostatic fat was then removed from the prostate allowing full exposure of the endopelvic fascia. The endopelvic fascia was then incised from the apex back to the base of the prostate bilaterally and the underlying levator muscle fibers were swept laterally off the prostate thereby isolating the dorsal venous complex. The dorsal vein was then stapled and divided with a 45 mm Flex Echelon stapler. Attention then turned to the bladder neck which was divided anteriorly thereby allowing entry into the bladder and exposure of the urethral catheter. The catheter balloon was deflated and the catheter was brought into the operative field and used to retract the prostate anteriorly. The posterior bladder neck was then examined and was divided allowing further dissection between the bladder and prostate posteriorly until the vasa deferentia and seminal vessels were identified. The vasa deferentia were isolated, divided, and lifted anteriorly. The seminal vesicles were dissected down to  their tips with care  to control the seminal vascular arterial blood supply. These structures were then lifted anteriorly and the space between Denonvillier's fascia and the anterior rectum was developed with a combination of sharp and blunt dissection. This isolated the vascular pedicles of the prostate.  The lateral prostatic fascia was then sharply incised allowing release of the neurovascular bundles bilaterally. The vascular pedicles of the prostate were then ligated with Weck clips between the prostate and neurovascular bundles and divided with sharp cold scissor dissection resulting in neurovascular bundle preservation. The neurovascular bundles were then separated off the apex of the prostate and urethra bilaterally.  The urethra was then sharply transected allowing the prostate specimen to be disarticulated. The pelvis was copiously irrigated and hemostasis was ensured. There was no evidence for rectal injury.  Attention then turned to the right pelvic sidewall. The fibrofatty tissue between the external iliac vein, confluence of the iliac vessels, hypogastric artery, and Cooper's ligament was dissected free from the pelvic sidewall with care to preserve the obturator nerve. Weck clips were used for lymphostasis and hemostasis. An identical procedure was performed on the contralateral side and the lymphatic packets were removed for permanent pathologic analysis.  Attention then turned to the urethral anastomosis. A 2-0 Vicryl slip knot was placed between Denonvillier's fascia, the posterior bladder neck, and the posterior urethra to reapproximate these structures. A double-armed 3-0 Monocryl suture was then used to perform a 360 running tension-free anastomosis between the bladder neck and urethra. A new urethral catheter was then placed into the bladder and irrigated. There were no blood clots within the bladder and the anastomosis appeared to be watertight. A #19 Blake drain was then brought through the left lateral 8  mm port site and positioned appropriately within the pelvis. It was secured to the skin with a nylon suture. The surgical cart was then undocked. The right lateral 12 mm port site was closed at the fascial level with a 0 Vicryl suture placed laparoscopically. All remaining ports were then removed under direct vision. The prostate specimen was removed intact within the Endopouch retrieval bag via the periumbilical camera port site. This fascial opening was closed with two running 0 Vicryl sutures. 0.25% Marcaine was then injected into all port sites and all incisions were reapproximated at the skin level with 4-0 Monocryl subcuticular sutures and Dermabond. The patient appeared to tolerate the procedure well and without complications. The patient was able to be extubated and transferred to the recovery unit in satisfactory condition.   Pryor Curia MD

## 2020-12-04 NOTE — Anesthesia Postprocedure Evaluation (Signed)
Anesthesia Post Note  Patient: James Barry  Procedure(s) Performed: XI ROBOTIC ASSISTED LAPAROSCOPIC RADICAL PROSTATECTOMY LEVEL 2 LYMPHADENECTOMY, PELVIC (Bilateral)     Patient location during evaluation: PACU Anesthesia Type: General Level of consciousness: awake and alert, oriented and patient cooperative Pain management: pain level controlled Vital Signs Assessment: post-procedure vital signs reviewed and stable Respiratory status: spontaneous breathing, nonlabored ventilation and respiratory function stable Cardiovascular status: blood pressure returned to baseline and stable Postop Assessment: no apparent nausea or vomiting Anesthetic complications: no   No notable events documented.  Last Vitals:  Vitals:   12/04/20 1100 12/04/20 1115  BP: 128/87 135/78  Pulse: 95 94  Resp: 19 15  Temp:    SpO2: 96% 91%    Last Pain:  Vitals:   12/04/20 1115  TempSrc:   PainSc: Ulysses

## 2020-12-05 ENCOUNTER — Encounter (HOSPITAL_COMMUNITY): Payer: Self-pay | Admitting: Urology

## 2020-12-05 DIAGNOSIS — C61 Malignant neoplasm of prostate: Secondary | ICD-10-CM | POA: Diagnosis not present

## 2020-12-05 DIAGNOSIS — Z79899 Other long term (current) drug therapy: Secondary | ICD-10-CM | POA: Diagnosis not present

## 2020-12-05 DIAGNOSIS — I10 Essential (primary) hypertension: Secondary | ICD-10-CM | POA: Diagnosis not present

## 2020-12-05 DIAGNOSIS — D360 Benign neoplasm of lymph nodes: Secondary | ICD-10-CM | POA: Diagnosis not present

## 2020-12-05 LAB — BASIC METABOLIC PANEL
Anion gap: 6 (ref 5–15)
BUN: 13 mg/dL (ref 8–23)
CO2: 25 mmol/L (ref 22–32)
Calcium: 8.4 mg/dL — ABNORMAL LOW (ref 8.9–10.3)
Chloride: 106 mmol/L (ref 98–111)
Creatinine, Ser: 0.79 mg/dL (ref 0.61–1.24)
GFR, Estimated: >60 mL/min (ref >60)
Glucose, Bld: 120 mg/dL — ABNORMAL HIGH (ref 70–99)
Potassium: 4 mmol/L (ref 3.5–5.1)
Sodium: 137 mmol/L (ref 135–145)

## 2020-12-05 LAB — HEMOGLOBIN AND HEMATOCRIT, BLOOD
HCT: 32.6 % — ABNORMAL LOW (ref 39.0–52.0)
Hemoglobin: 10.8 g/dL — ABNORMAL LOW (ref 13.0–17.0)

## 2020-12-05 MED ORDER — BISACODYL 10 MG RE SUPP
10.0000 mg | Freq: Once | RECTAL | Status: AC
  Administered 2020-12-05: 10 mg via RECTAL
  Filled 2020-12-05: qty 1

## 2020-12-05 MED ORDER — TRAMADOL HCL 50 MG PO TABS: 50.0000 mg | ORAL_TABLET | Freq: Four times a day (QID) | ORAL | Status: DC | PRN

## 2020-12-05 NOTE — Progress Notes (Signed)
Patient ID: James Barry, male   DOB: 03-26-1955, 65 y.o.   MRN: [id number] 1 Day Post-Op Subjective: The patient is doing well.  No nausea or vomiting. Pain is adequately controlled.  Objective: Vital signs in last 24 hours: Temp:  [97.5 F (36.4 C)-98.1 F (36.7 C)] 98.1 F (36.7 C) (09/20 0351) Pulse Rate:  [82-101] 82 (09/20 0351) Resp:  [11-22] 17 (09/20 0351) BP: (108-142)/(43-100) 136/93 (09/20 0351) SpO2:  [90 %-97 %] 96 % (09/20 0351) Weight:  [70.9 kg] 70.9 kg (09/19 1722)  Intake/Output from previous day: 09/19 0701 - 09/20 0700 In: 3043.5 [P.O.:120; I.V.:2673.5; IV Piggyback:250] Out: 3080 [Urine:2650; Drains:330; Blood:100] Intake/Output this shift: No intake/output data recorded.  Physical Exam:  General: Alert and oriented. CV: RRR Lungs: Clear bilaterally. GI: Soft, Nondistended. Incisions: Clean, dry, and intact Urine: Clear Extremities: Nontender, no erythema, no edema.  Lab Results: Recent Labs    12/04/20 0956 12/05/20 0447  HGB 12.0* 10.8*  HCT 36.6* 32.6*      Assessment/Plan: POD# 1 s/p robotic prostatectomy.  1) SL IVF 2) Ambulate, Incentive spirometry 3) Transition to oral pain medication 4) Dulcolax suppository 5) D/C pelvic drain 6) Plan for likely discharge later today   Pryor Curia. MD   LOS: 0 days   Dutch Gray 12/05/2020, 7:51 AM

## 2020-12-05 NOTE — Plan of Care (Addendum)
Discharge education completed with pt and partner. Pt educated about foley care and incision site care. Pt educated about medications and when to seek medical attention. Pt follow up appointments reviewed. Pt safety maintained.  Pt discharge home with partner. Pt safety maintained . Problem: Education: Goal: Knowledge of General Education information will improve Description: Including pain rating scale, medication(s)/side effects and non-pharmacologic comfort measures Outcome: Adequate for Discharge   Problem: Health Behavior/Discharge Planning: Goal: Ability to manage health-related needs will improve Outcome: Adequate for Discharge   Problem: Clinical Measurements: Goal: Ability to maintain clinical measurements within normal limits will improve Outcome: Adequate for Discharge Goal: Will remain free from infection Outcome: Adequate for Discharge Goal: Diagnostic test results will improve Outcome: Adequate for Discharge Goal: Respiratory complications will improve Outcome: Adequate for Discharge Goal: Cardiovascular complication will be avoided Outcome: Adequate for Discharge   Problem: Activity: Goal: Risk for activity intolerance will decrease Outcome: Adequate for Discharge   Problem: Nutrition: Goal: Adequate nutrition will be maintained Outcome: Adequate for Discharge   Problem: Coping: Goal: Level of anxiety will decrease Outcome: Adequate for Discharge   Problem: Elimination: Goal: Will not experience complications related to bowel motility Outcome: Adequate for Discharge Goal: Will not experience complications related to urinary retention Outcome: Adequate for Discharge   Problem: Pain Managment: Goal: General experience of comfort will improve Outcome: Adequate for Discharge   Problem: Safety: Goal: Ability to remain free from injury will improve Outcome: Adequate for Discharge   Problem: Skin Integrity: Goal: Risk for impaired skin integrity will  decrease Outcome: Adequate for Discharge

## 2020-12-05 NOTE — Discharge Summary (Signed)
  Date of admission: 12/04/2020  Date of discharge: 12/05/2020  Admission diagnosis: Prostate Cancer  Discharge diagnosis: Prostate Cancer  History and Physical: For full details, please see admission history and physical. Briefly, KOL CONSUEGRA is a 65 y.o. gentleman with localized prostate cancer.  After discussing management/treatment options, he elected to proceed with surgical treatment.  Hospital Course: BRECKIN SAVANNAH was taken to the operating room on 12/04/2020 and underwent a robotic assisted laparoscopic radical prostatectomy. He tolerated this procedure well and without complications. Postoperatively, he was able to be transferred to a regular hospital room following recovery from anesthesia.  He was able to begin ambulating the night of surgery. He remained hemodynamically stable overnight.  He had excellent urine output with appropriately minimal output from his pelvic drain and his pelvic drain was removed on POD #1.  He was transitioned to oral pain medication, tolerated a clear liquid diet, and had met all discharge criteria and was able to be discharged home later on POD#1.  Laboratory values:  Recent Labs    12/04/20 0956 12/05/20 0447  HGB 12.0* 10.8*  HCT 36.6* 32.6*    Disposition: Home  Discharge instruction: He was instructed to be ambulatory but to refrain from heavy lifting, strenuous activity, or driving. He was instructed on urethral catheter care.  Discharge medications:   Allergies as of 12/05/2020       Reactions   Buspirone    Unaware    Cephalexin    Unaware         Medication List     STOP taking these medications    multivitamin capsule   tamsulosin 0.4 MG Caps capsule Commonly known as: FLOMAX       TAKE these medications    acetaminophen 650 MG CR tablet Commonly known as: TYLENOL Take 1,300 mg by mouth every morning.   Descovy 200-25 MG tablet Generic drug: emtricitabine-tenofovir AF TAKE 1 TABLET BY MOUTH DAILY.    docusate sodium 100 MG capsule Commonly known as: COLACE Take 1 capsule (100 mg total) by mouth 2 (two) times daily.   esomeprazole 40 MG capsule Commonly known as: NEXIUM Take 1 capsule  by mouth daily for heartburn.   FIBER PO Take 2 capsules by mouth daily.   sildenafil 100 MG tablet Commonly known as: VIAGRA Take 1 tablet by mouth 30-60 minutes prior to intercourse as needed.   sulfamethoxazole-trimethoprim 800-160 MG tablet Commonly known as: BACTRIM DS Take 1 tablet by mouth 2 (two) times daily. Start the day prior to foley removal appointment   tobramycin-dexamethasone ophthalmic solution Commonly known as: TOBRADEX INSTILL 1 DROP INTO BOTH EYES FOUR TIMES A DAY What changed:  how much to take how to take this when to take this reasons to take this   traMADol 50 MG tablet Commonly known as: Ultram Take 1-2 tablets by mouth every 6 (six) hours as needed for moderate pain or severe pain.        Followup: He will followup in 1 week for catheter removal and to discuss his surgical pathology results.

## 2020-12-05 NOTE — Progress Notes (Signed)
Pt ambulated on hall this morning, tolerated well.

## 2020-12-07 LAB — SURGICAL PATHOLOGY

## 2020-12-20 ENCOUNTER — Other Ambulatory Visit (HOSPITAL_COMMUNITY): Payer: Self-pay

## 2020-12-28 ENCOUNTER — Other Ambulatory Visit (HOSPITAL_COMMUNITY): Payer: Self-pay

## 2021-01-02 DIAGNOSIS — M6281 Muscle weakness (generalized): Secondary | ICD-10-CM | POA: Diagnosis not present

## 2021-01-02 DIAGNOSIS — N393 Stress incontinence (female) (male): Secondary | ICD-10-CM | POA: Diagnosis not present

## 2021-01-02 DIAGNOSIS — M62838 Other muscle spasm: Secondary | ICD-10-CM | POA: Diagnosis not present

## 2021-01-15 ENCOUNTER — Other Ambulatory Visit (HOSPITAL_COMMUNITY): Payer: Self-pay

## 2021-01-17 ENCOUNTER — Other Ambulatory Visit (HOSPITAL_COMMUNITY): Payer: Self-pay

## 2021-01-17 DIAGNOSIS — M6281 Muscle weakness (generalized): Secondary | ICD-10-CM | POA: Diagnosis not present

## 2021-01-17 DIAGNOSIS — M62838 Other muscle spasm: Secondary | ICD-10-CM | POA: Diagnosis not present

## 2021-01-17 DIAGNOSIS — N393 Stress incontinence (female) (male): Secondary | ICD-10-CM | POA: Diagnosis not present

## 2021-02-01 DIAGNOSIS — N393 Stress incontinence (female) (male): Secondary | ICD-10-CM | POA: Diagnosis not present

## 2021-02-01 DIAGNOSIS — M6281 Muscle weakness (generalized): Secondary | ICD-10-CM | POA: Diagnosis not present

## 2021-02-01 DIAGNOSIS — M62838 Other muscle spasm: Secondary | ICD-10-CM | POA: Diagnosis not present

## 2021-02-05 ENCOUNTER — Other Ambulatory Visit (HOSPITAL_COMMUNITY): Payer: Self-pay

## 2021-02-09 ENCOUNTER — Other Ambulatory Visit (HOSPITAL_COMMUNITY): Payer: Self-pay

## 2021-02-21 ENCOUNTER — Other Ambulatory Visit: Payer: Self-pay

## 2021-02-21 ENCOUNTER — Encounter: Payer: Self-pay | Admitting: Family

## 2021-02-21 ENCOUNTER — Ambulatory Visit (INDEPENDENT_AMBULATORY_CARE_PROVIDER_SITE_OTHER): Payer: 59 | Admitting: Family

## 2021-02-21 ENCOUNTER — Other Ambulatory Visit (HOSPITAL_COMMUNITY)
Admission: RE | Admit: 2021-02-21 | Discharge: 2021-02-21 | Disposition: A | Discharge status: Home / Self Care | Payer: 59 | Source: Ambulatory Visit | Attending: Family | Admitting: Family

## 2021-02-21 ENCOUNTER — Other Ambulatory Visit (HOSPITAL_COMMUNITY): Payer: Self-pay

## 2021-02-21 VITALS — BP 138/83 | HR 88 | Temp 97.9°F | Wt 159.2 lb

## 2021-02-21 DIAGNOSIS — Z7252 High risk homosexual behavior: Secondary | ICD-10-CM

## 2021-02-21 DIAGNOSIS — Z113 Encounter for screening for infections with a predominantly sexual mode of transmission: Secondary | ICD-10-CM | POA: Diagnosis not present

## 2021-02-21 DIAGNOSIS — Z79899 Other long term (current) drug therapy: Secondary | ICD-10-CM | POA: Insufficient documentation

## 2021-02-21 MED ORDER — EMTRICITABINE-TENOFOVIR AF 200-25 MG PO TABS
1.0000 | ORAL_TABLET | Freq: Every day | ORAL | 3 refills | Status: DC
  Filled 2021-02-21 – 2021-03-16 (×2): qty 30, 30d supply, fill #0
  Filled 2021-04-09: qty 30, 30d supply, fill #1
  Filled 2021-05-03: qty 30, 30d supply, fill #2
  Filled 2021-06-04: qty 30, 30d supply, fill #3

## 2021-02-21 NOTE — Assessment & Plan Note (Signed)
James Barry continues to have good adherence and tolerance to his preexposure prophylaxis regimen of Descovy.  No problems obtaining medication from the pharmacy.  Continue current dose of Descovy.  Discussed importance of safe sexual practice and condom use.  Check lab work today.  Plan for follow-up in 3 months or sooner if needed.

## 2021-02-21 NOTE — Patient Instructions (Signed)
Nice to see you.  We will check your lab work today.  Continue to take your medication daily as prescribed.  Refills have been sent to the pharmacy.  Plan for follow up in 3 month or sooner if needed with lab work on the same day.  Have a great day and stay safe!

## 2021-02-21 NOTE — Progress Notes (Signed)
Subjective:    Patient ID: James Barry, male    DOB: 1955-04-25, 65 y.o.   MRN: [id number]  Chief Complaint  Patient presents with   Follow-up    HPI:  James Barry is a 65 y.o. male on preexposure prophylaxis for HIV with Descovy last seen on 11/22/2020 with negative HIV and STD testing.  Here today for routine follow-up.  James Barry continues to take his Descovy daily with no adverse side effects.  He has no problems obtaining medication from the pharmacy.  Overall feeling well today with no new concerns/complaints. Denies fevers, chills, night sweats, headaches, changes in vision, neck pain/stiffness, nausea, diarrhea, vomiting, lesions or rashes.  Continues to recover from prostate surgery.   Allergies  Allergen Reactions   Buspirone     Unaware    Cephalexin     Unaware       Outpatient Medications Prior to Visit  Medication Sig Dispense Refill   acetaminophen (TYLENOL) 650 MG CR tablet Take 1,300 mg by mouth every morning.     docusate sodium (COLACE) 100 MG capsule Take 1 capsule (100 mg total) by mouth 2 (two) times daily.     esomeprazole (NEXIUM) 40 MG capsule Take 1 capsule  by mouth daily for heartburn. 90 capsule 3   sildenafil (VIAGRA) 100 MG tablet Take 1 tablet by mouth 30-60 minutes prior to intercourse as needed. 90 tablet 0   sulfamethoxazole-trimethoprim (BACTRIM DS) 800-160 MG tablet Take 1 tablet by mouth 2 (two) times daily. Start the day prior to foley removal appointment 6 tablet 0   tobramycin-dexamethasone (TOBRADEX) ophthalmic solution INSTILL 1 DROP INTO BOTH EYES FOUR TIMES A DAY (Patient taking differently: Place 1 drop into both eyes daily as needed (irritation).) 5 mL 1   traMADol (ULTRAM) 50 MG tablet Take 1-2 tablets by mouth every 6 (six) hours as needed for moderate pain or severe pain. 20 tablet 0   emtricitabine-tenofovir AF (DESCOVY) 200-25 MG tablet TAKE 1 TABLET BY MOUTH DAILY. 30 tablet 3   FIBER PO Take 2 capsules by mouth daily.      No facility-administered medications prior to visit.     Past Medical History:  Diagnosis Date   Acute neck pain 08/19/2019   Acute thoracic back pain 07/30/2019   Arthritis    Cancer ([place])    prostate   Clavicular asymmetry 07/30/2019   Diverticulosis    Erectile dysfunction    GERD (gastroesophageal reflux disease)    Hyperlipidemia    Vertigo      Past Surgical History:  Procedure Laterality Date   COLONOSCOPY  03/19/2011   cleared for 5 yrs- Dr @ [facility] N[facility])   [place] Bilateral 12/04/2020   Procedure: LYMPHADENECTOMY, PELVIC;  Surgeon: Raynelle Bring, MD;  Location: [facility];  Service: Urology;  Laterality: Bilateral;   ROBOT ASSISTED LAPAROSCOPIC RADICAL PROSTATECTOMY N/A 12/04/2020   Procedure: XI ROBOTIC ASSISTED LAPAROSCOPIC RADICAL PROSTATECTOMY LEVEL 2;  Surgeon: Raynelle Bring, MD;  Location: [facility];  Service: Urology;  Laterality: N/A;       Review of Systems  Constitutional:  Negative for appetite change, chills, fatigue, fever and unexpected weight change.  Eyes:  Negative for visual disturbance.  Respiratory:  Negative for cough, chest tightness, shortness of breath and wheezing.   Cardiovascular:  Negative for chest pain and leg swelling.  Gastrointestinal:  Negative for abdominal pain, constipation, diarrhea, nausea and vomiting.  Genitourinary:  Negative for dysuria, flank pain,  frequency, genital sores, hematuria and urgency.  Skin:  Negative for rash.  Allergic/Immunologic: Negative for immunocompromised state.  Neurological:  Negative for dizziness and headaches.     Objective:    BP 138/83   Pulse 88   Temp 97.9 F (36.6 C) (Oral)   Wt 159 lb 3.2 oz (72.2 kg)   SpO2 95%   BMI 24.93 kg/m  Nursing note and vital signs reviewed.  Physical Exam Constitutional:      General: He is not in acute distress.    Appearance: He is well-developed.  Eyes:     Conjunctiva/sclera: Conjunctivae normal.   Cardiovascular:     Rate and Rhythm: Normal rate and regular rhythm.     Heart sounds: Normal heart sounds. No murmur heard.   No friction rub. No gallop.  Pulmonary:     Effort: Pulmonary effort is normal. No respiratory distress.     Breath sounds: Normal breath sounds. No wheezing or rales.  Chest:     Chest wall: No tenderness.  Abdominal:     General: Bowel sounds are normal.     Palpations: Abdomen is soft.     Tenderness: There is no abdominal tenderness.  Musculoskeletal:     Cervical back: Neck supple.  Lymphadenopathy:     Cervical: No cervical adenopathy.  Skin:    General: Skin is warm and dry.     Findings: No rash.  Neurological:     Mental Status: He is alert and oriented to person, place, and time.  Psychiatric:        Behavior: Behavior normal.        Thought Content: Thought content normal.        Judgment: Judgment normal.     Depression screen [facility] 2/9 11/22/2020 08/29/2020 08/07/2020 04/20/2020 01/20/2020  Decreased Interest 0 0 0 0 0  Down, Depressed, Hopeless 0 0 0 0 0  PHQ - 2 Score 0 0 0 0 0  Altered sleeping - 0 - - -  Tired, decreased energy - 0 - - -  Change in appetite - 0 - - -  Feeling bad or failure about yourself  - 0 - - -  Trouble concentrating - 0 - - -  Moving slowly or fidgety/restless - 0 - - -  Suicidal thoughts - 0 - - -  PHQ-9 Score - 0 - - -  Difficult doing work/chores - Not difficult at all - - -       Assessment & Plan:    Patient Active Problem List   Diagnosis Date Noted   Prostate cancer ([place]) 12/04/2020   Malignant neoplasm of prostate ([place]) 10/18/2020   History of diverticulitis 08/29/2020   Elevated PSA 04/20/2020   Chest discomfort 11/06/2018   High risk homosexual behavior 09/23/2018   Groin discomfort 09/21/2018   Preventative health care 08/24/2018   On pre-exposure prophylaxis for HIV 12/17/2017   Chronic left shoulder pain 08/13/2017   Nocturia 08/13/2017   Exposure to HIV 06/06/2017   Erectile  dysfunction 06/28/2015   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  No problems obtaining medication from the pharmacy.  Continue current dose of Descovy.  Discussed importance of safe sexual practice and condom use.  Check lab work today.  Plan for follow-up in 3 months or sooner if needed.      Relevant Orders   COMPLETE METABOLIC PANEL WITH GFR   HIV Antibody (routine testing w rflx)   RPR   Urine cytology ancillary only([place])   Other Visit Diagnoses     Screening examination for venereal disease            I am having James Nay "Richardson Landry" maintain his FIBER PO, acetaminophen, tobramycin-dexamethasone, esomeprazole, sildenafil, docusate sodium, sulfamethoxazole-trimethoprim, traMADol, and emtricitabine-tenofovir AF.   Meds ordered this encounter  Medications   emtricitabine-tenofovir AF (DESCOVY) 200-25 MG tablet    Sig: TAKE 1 TABLET BY MOUTH DAILY.    Dispense:  30 tablet    Refill:  3    Order Specific Question:   Supervising Provider    Answer:   Carlyle Basques [[id number]]     Follow-up: Return in about 3 months (around 05/22/2021).   Terri Piedra, MSN, FNP-C Nurse Practitioner [facility] for Infectious Disease [place] number: [phone]

## 2021-02-22 LAB — COMPLETE METABOLIC PANEL WITH GFR
AG Ratio: 1.5 (calc) (ref 1.0–2.5)
ALT: 17 U/L (ref 9–46)
AST: 17 U/L (ref 10–35)
Albumin: 4.4 g/dL (ref 3.6–5.1)
Alkaline phosphatase (APISO): 66 U/L (ref 35–144)
BUN: 12 mg/dL (ref 7–25)
CO2: 25 mmol/L (ref 20–32)
Calcium: 9.4 mg/dL (ref 8.6–10.3)
Chloride: 106 mmol/L (ref 98–110)
Creat: 0.93 mg/dL (ref 0.70–1.35)
Globulin: 2.9 g/dL (calc) (ref 1.9–3.7)
Glucose, Bld: 91 mg/dL (ref 65–99)
Potassium: 4.2 mmol/L (ref 3.5–5.3)
Sodium: 141 mmol/L (ref 135–146)
Total Bilirubin: 0.2 mg/dL (ref 0.2–1.2)
Total Protein: 7.3 g/dL (ref 6.1–8.1)
eGFR: 91 mL/min/{1.73_m2} (ref >=60)

## 2021-02-22 LAB — HIV ANTIBODY (ROUTINE TESTING W REFLEX): HIV 1&2 Ab, 4th Generation: NONREACTIVE

## 2021-02-22 LAB — RPR: RPR Ser Ql: NONREACTIVE

## 2021-02-22 LAB — URINE CYTOLOGY ANCILLARY ONLY
Chlamydia: NEGATIVE
Comment: NEGATIVE
Comment: NORMAL
Neisseria Gonorrhea: NEGATIVE

## 2021-02-23 ENCOUNTER — Other Ambulatory Visit (HOSPITAL_COMMUNITY): Payer: Self-pay

## 2021-02-23 DIAGNOSIS — T1501XA Foreign body in cornea, right eye, initial encounter: Secondary | ICD-10-CM | POA: Diagnosis not present

## 2021-02-23 MED ORDER — NEOMYCIN-POLYMYXIN-DEXAMETH 0.1 % OP SUSP
OPHTHALMIC | 0 refills | Status: DC
  Filled 2021-02-23: qty 5, 25d supply, fill #0

## 2021-02-27 DIAGNOSIS — M6281 Muscle weakness (generalized): Secondary | ICD-10-CM | POA: Diagnosis not present

## 2021-02-27 DIAGNOSIS — N393 Stress incontinence (female) (male): Secondary | ICD-10-CM | POA: Diagnosis not present

## 2021-02-27 DIAGNOSIS — C61 Malignant neoplasm of prostate: Secondary | ICD-10-CM | POA: Diagnosis not present

## 2021-02-27 DIAGNOSIS — M62838 Other muscle spasm: Secondary | ICD-10-CM | POA: Diagnosis not present

## 2021-03-05 ENCOUNTER — Other Ambulatory Visit (HOSPITAL_COMMUNITY): Payer: Self-pay

## 2021-03-07 DIAGNOSIS — N5201 Erectile dysfunction due to arterial insufficiency: Secondary | ICD-10-CM | POA: Diagnosis not present

## 2021-03-07 DIAGNOSIS — C61 Malignant neoplasm of prostate: Secondary | ICD-10-CM | POA: Diagnosis not present

## 2021-03-07 DIAGNOSIS — N393 Stress incontinence (female) (male): Secondary | ICD-10-CM | POA: Diagnosis not present

## 2021-03-16 ENCOUNTER — Other Ambulatory Visit (HOSPITAL_COMMUNITY): Payer: Self-pay

## 2021-03-19 ENCOUNTER — Other Ambulatory Visit (HOSPITAL_COMMUNITY): Payer: Self-pay

## 2021-03-26 DIAGNOSIS — L249 Irritant contact dermatitis, unspecified cause: Secondary | ICD-10-CM | POA: Diagnosis not present

## 2021-03-26 DIAGNOSIS — Z86018 Personal history of other benign neoplasm: Secondary | ICD-10-CM | POA: Diagnosis not present

## 2021-03-26 DIAGNOSIS — L578 Other skin changes due to chronic exposure to nonionizing radiation: Secondary | ICD-10-CM | POA: Diagnosis not present

## 2021-03-28 DIAGNOSIS — N393 Stress incontinence (female) (male): Secondary | ICD-10-CM | POA: Diagnosis not present

## 2021-03-28 DIAGNOSIS — M6281 Muscle weakness (generalized): Secondary | ICD-10-CM | POA: Diagnosis not present

## 2021-03-28 DIAGNOSIS — M62838 Other muscle spasm: Secondary | ICD-10-CM | POA: Diagnosis not present

## 2021-04-09 ENCOUNTER — Other Ambulatory Visit (HOSPITAL_COMMUNITY): Payer: Self-pay

## 2021-04-11 ENCOUNTER — Other Ambulatory Visit: Payer: Self-pay | Admitting: Primary Care

## 2021-04-11 ENCOUNTER — Other Ambulatory Visit (HOSPITAL_COMMUNITY): Payer: Self-pay

## 2021-04-11 DIAGNOSIS — N529 Male erectile dysfunction, unspecified: Secondary | ICD-10-CM

## 2021-04-11 NOTE — Telephone Encounter (Signed)
Requesting 90 day supply.

## 2021-04-12 ENCOUNTER — Other Ambulatory Visit (HOSPITAL_COMMUNITY): Payer: Self-pay

## 2021-04-12 MED ORDER — SILDENAFIL CITRATE 100 MG PO TABS
ORAL_TABLET | ORAL | 0 refills | Status: DC
  Filled 2021-04-12: qty 30, 30d supply, fill #0
  Filled 2021-05-16: qty 30, 30d supply, fill #1
  Filled 2021-06-18: qty 30, 30d supply, fill #2

## 2021-04-16 ENCOUNTER — Other Ambulatory Visit (HOSPITAL_COMMUNITY): Payer: Self-pay
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
## 2021-05-03 ENCOUNTER — Other Ambulatory Visit (HOSPITAL_COMMUNITY): Payer: Self-pay

## 2021-05-14 ENCOUNTER — Other Ambulatory Visit (HOSPITAL_COMMUNITY): Payer: Self-pay

## 2021-05-16 ENCOUNTER — Other Ambulatory Visit (HOSPITAL_COMMUNITY): Payer: Self-pay

## 2021-05-17 DIAGNOSIS — G2581 Restless legs syndrome: Secondary | ICD-10-CM | POA: Diagnosis not present

## 2021-05-17 DIAGNOSIS — I872 Venous insufficiency (chronic) (peripheral): Secondary | ICD-10-CM | POA: Diagnosis not present

## 2021-05-17 DIAGNOSIS — R600 Localized edema: Secondary | ICD-10-CM | POA: Diagnosis not present

## 2021-05-18 NOTE — Telephone Encounter (Signed)
Please call patient and set up physical if he is due.  ?

## 2021-05-21 ENCOUNTER — Other Ambulatory Visit (HOSPITAL_COMMUNITY): Payer: Self-pay

## 2021-05-21 DIAGNOSIS — H52223 Regular astigmatism, bilateral: Secondary | ICD-10-CM | POA: Diagnosis not present

## 2021-05-21 DIAGNOSIS — H524 Presbyopia: Secondary | ICD-10-CM | POA: Diagnosis not present

## 2021-05-21 DIAGNOSIS — H5203 Hypermetropia, bilateral: Secondary | ICD-10-CM | POA: Diagnosis not present

## 2021-05-21 MED ORDER — TOBRAMYCIN-DEXAMETHASONE 0.3-0.1 % OP SUSP
OPHTHALMIC | 0 refills | Status: DC
  Filled 2021-05-21: qty 5, 7d supply, fill #0

## 2021-05-30 DIAGNOSIS — N393 Stress incontinence (female) (male): Secondary | ICD-10-CM | POA: Diagnosis not present

## 2021-05-30 DIAGNOSIS — M6281 Muscle weakness (generalized): Secondary | ICD-10-CM | POA: Diagnosis not present

## 2021-05-30 DIAGNOSIS — M62838 Other muscle spasm: Secondary | ICD-10-CM | POA: Diagnosis not present

## 2021-06-04 ENCOUNTER — Other Ambulatory Visit (HOSPITAL_COMMUNITY): Payer: Self-pay

## 2021-06-05 ENCOUNTER — Other Ambulatory Visit: Payer: Self-pay

## 2021-06-05 ENCOUNTER — Encounter: Payer: Self-pay | Admitting: Family

## 2021-06-05 ENCOUNTER — Ambulatory Visit: Payer: 59 | Admitting: Family

## 2021-06-05 ENCOUNTER — Other Ambulatory Visit (HOSPITAL_COMMUNITY)
Admission: RE | Admit: 2021-06-05 | Discharge: 2021-06-05 | Disposition: A | Discharge status: Home / Self Care | Payer: 59 | Source: Ambulatory Visit | Attending: Family | Admitting: Family

## 2021-06-05 ENCOUNTER — Other Ambulatory Visit (HOSPITAL_COMMUNITY): Payer: Self-pay

## 2021-06-05 VITALS — BP 125/80 | HR 72 | Temp 97.7°F | Ht 67.0 in | Wt 163.0 lb

## 2021-06-05 DIAGNOSIS — Z79899 Other long term (current) drug therapy: Secondary | ICD-10-CM

## 2021-06-05 MED ORDER — EMTRICITABINE-TENOFOVIR AF 200-25 MG PO TABS
1.0000 | ORAL_TABLET | Freq: Every day | ORAL | 3 refills | Status: DC
  Filled 2021-06-05: qty 30, 30d supply, fill #0
  Filled 2021-07-02: qty 30, 30d supply, fill #1
  Filled 2021-08-02: qty 30, 30d supply, fill #2
  Filled 2021-08-24: qty 30, 30d supply, fill #3

## 2021-06-05 NOTE — Assessment & Plan Note (Signed)
Mr. Deeney continues to have good tolerance to HIV prophylaxis with Descovy. Reviewed previous lab work results. Check HIV and STD today. Discussed importance of safe sexual practice and condom use. Continue current dose of Descovy. Plan for follow up in 3 months or sooner if needed.  ?

## 2021-06-05 NOTE — Patient Instructions (Signed)
Nice to see you.  We will check your lab work today.  Continue to take your medication daily as prescribed.  Refills have been sent to the pharmacy.  Plan for follow up in 3 months or sooner if needed with lab work on the same day.  Have a great day and stay safe!  

## 2021-06-05 NOTE — Progress Notes (Signed)
? ?Subjective:  ? ? Patient ID: James Barry, male    DOB: 19-Oct-1955, 66 y.o.   MRN: [id number] ? ?Chief Complaint  ?Patient presents with  ? Follow-up  ?  Declined condoms   ? ? ?HPI: ? ?James Barry is a 66 y.o. male on preexposure prophylaxis for HIV last seen on 02/21/2021 with good adherence and tolerance to Descovy.  HIV and STD testing was negative.  Here today for routine follow-up. ? ?James Barry continues to take Descovy daily as prescribed with no adverse side effects.  Overall doing well today with no new concerns/complaints. Denies fevers, chills, night sweats, headaches, changes in vision, neck pain/stiffness, nausea, diarrhea, vomiting, lesions or rashes.  No problems obtaining medication from the pharmacy.  Condoms offered. Now cancer free.  ? ?Allergies  ?Allergen Reactions  ? Buspirone   ?  Unaware   ? Cephalexin   ?  Unaware   ? ? ? ? ?Outpatient Medications Prior to Visit  ?Medication Sig Dispense Refill  ? acetaminophen (TYLENOL) 650 MG CR tablet Take 1,300 mg by mouth every morning.    ? docusate sodium (COLACE) 100 MG capsule Take 1 capsule (100 mg total) by mouth 2 (two) times daily.    ? esomeprazole (NEXIUM) 40 MG capsule Take 1 capsule  by mouth daily for heartburn. 90 capsule 3  ? sildenafil (VIAGRA) 100 MG tablet Take 1 tablet by mouth 30-60 minutes prior to intercourse as needed. 90 tablet 0  ? tobramycin-dexamethasone (TOBRADEX) ophthalmic solution Instill 1 drop into both eyes four times a day 5 mL 0  ? emtricitabine-tenofovir AF (DESCOVY) 200-25 MG tablet TAKE 1 TABLET BY MOUTH DAILY. 30 tablet 3  ? neomycin-polymyxin-dexamethasone (MAXITROL) 0.1 % ophthalmic suspension Instill one drop into the right eye four times daily for 5 days. (Patient not taking: Reported on 06/05/2021) 5 mL 0  ? sulfamethoxazole-trimethoprim (BACTRIM DS) 800-160 MG tablet Take 1 tablet by mouth 2 (two) times daily. Start the day prior to foley removal appointment (Patient not taking: Reported on 06/05/2021) 6  tablet 0  ? traMADol (ULTRAM) 50 MG tablet Take 1-2 tablets by mouth every 6 (six) hours as needed for moderate pain or severe pain. (Patient not taking: Reported on 06/05/2021) 20 tablet 0  ? ?No facility-administered medications prior to visit.  ? ? ? ?Past Medical History:  ?Diagnosis Date  ? Acute neck pain 08/19/2019  ? Acute thoracic back pain 07/30/2019  ? Arthritis   ? Cancer [facility])   ? prostate  ? Clavicular asymmetry 07/30/2019  ? Diverticulosis   ? Erectile dysfunction   ? GERD (gastroesophageal reflux disease)   ? Hyperlipidemia   ? Vertigo   ? ? ? ?Past Surgical History:  ?Procedure Laterality Date  ? COLONOSCOPY  03/19/2011  ? cleared for 5 yrs- Dr @ [facility])  ? HERNIA REPAIR    ? LYMPHADENECTOMY Bilateral 12/04/2020  ? Procedure: LYMPHADENECTOMY, PELVIC;  Surgeon: Raynelle Bring, MD;  Location: [facility];  Service: Urology;  Laterality: Bilateral;  ? ROBOT ASSISTED LAPAROSCOPIC RADICAL PROSTATECTOMY N/A 12/04/2020  ? Procedure: XI ROBOTIC ASSISTED LAPAROSCOPIC RADICAL PROSTATECTOMY LEVEL 2;  Surgeon: Raynelle Bring, MD;  Location: [facility];  Service: Urology;  Laterality: N/A;  ? ? ? ? ? ?Review of Systems  ?Constitutional:  Negative for appetite change, chills, fatigue, fever and unexpected weight change.  ?Eyes:  Negative for visual disturbance.  ?Respiratory:  Negative for cough, chest tightness, shortness of breath and wheezing.   ?Cardiovascular:  Negative for chest pain and leg swelling.  ?Gastrointestinal:  Negative for abdominal pain, constipation, diarrhea, nausea and vomiting.  ?Genitourinary:  Negative for dysuria, flank pain, frequency, genital sores, hematuria and urgency.  ?Skin:  Negative for rash.  ?Allergic/Immunologic: Negative for immunocompromised state.  ?Neurological:  Negative for dizziness and headaches.  ?   ?Objective:  ?  ?BP 125/80   Pulse 72   Temp 97.7 ?F (36.5 ?C) (Oral)   Ht '5\' 7"'$  (1.702 m)   Wt 163 lb (73.9 kg)   SpO2 96%   BMI 25.53 kg/m?  ?Nursing note  and vital signs reviewed. ? ?Physical Exam ?Constitutional:   ?   General: He is not in acute distress. ?   Appearance: He is well-developed.  ?Eyes:  ?   Conjunctiva/sclera: Conjunctivae normal.  ?Cardiovascular:  ?   Rate and Rhythm: Normal rate and regular rhythm.  ?   Heart sounds: Normal heart sounds. No murmur heard. ?  No friction rub. No gallop.  ?Pulmonary:  ?   Effort: Pulmonary effort is normal. No respiratory distress.  ?   Breath sounds: Normal breath sounds. No wheezing or rales.  ?Chest:  ?   Chest wall: No tenderness.  ?Abdominal:  ?   General: Bowel sounds are normal.  ?   Palpations: Abdomen is soft.  ?   Tenderness: There is no abdominal tenderness.  ?Musculoskeletal:  ?   Cervical back: Neck supple.  ?Lymphadenopathy:  ?   Cervical: No cervical adenopathy.  ?Skin: ?   General: Skin is warm and dry.  ?   Findings: No rash.  ?Neurological:  ?   Mental Status: He is alert and oriented to person, place, and time.  ?Psychiatric:     ?   Behavior: Behavior normal.     ?   Thought Content: Thought content normal.     ?   Judgment: Judgment normal.  ? ? ? ?Depression screen [facility] 2/9 06/05/2021 11/22/2020 08/29/2020 08/07/2020 04/20/2020  ?Decreased Interest 0 0 0 0 0  ?Down, Depressed, Hopeless 1 0 0 0 0  ?PHQ - 2 Score 1 0 0 0 0  ?Altered sleeping - - 0 - -  ?Tired, decreased energy - - 0 - -  ?Change in appetite - - 0 - -  ?Feeling bad or failure about yourself  - - 0 - -  ?Trouble concentrating - - 0 - -  ?Moving slowly or fidgety/restless - - 0 - -  ?Suicidal thoughts - - 0 - -  ?PHQ-9 Score - - 0 - -  ?Difficult doing work/chores - - Not difficult at all - -  ?  ?   ?Assessment & Plan:  ? ? ?Patient Active Problem List  ? Diagnosis Date Noted  ? Prostate cancer ([place]) 12/04/2020  ? Malignant neoplasm of prostate ([place]) 10/18/2020  ? History of diverticulitis 08/29/2020  ? Elevated PSA 04/20/2020  ? Chest discomfort 11/06/2018  ? High risk homosexual behavior 09/23/2018  ? Groin discomfort 09/21/2018  ?  Preventative health care 08/24/2018  ? On pre-exposure prophylaxis for HIV 12/17/2017  ? Chronic left shoulder pain 08/13/2017  ? Nocturia 08/13/2017  ? Exposure to HIV 06/06/2017  ? Erectile dysfunction 06/28/2015  ? 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 Routine screening for STI (sexually transmitted infection) 07/14/2014  ? Abnormal LFTs 07/14/2014  ? Essential (primary) hypertension 07/14/2014  ? Acid reflux 07/14/2014  ? ? ? ?Problem List Items Addressed This Visit   ? ?  ?  Other  ? On pre-exposure prophylaxis for HIV - Primary  ?  James Barry continues to have good tolerance to HIV prophylaxis with Descovy. Reviewed previous lab work results. Check HIV and STD today. Discussed importance of safe sexual practice and condom use. Continue current dose of Descovy. Plan for follow up in 3 months or sooner if needed.  ?  ?  ? Relevant Orders  ? Cytology (oral, anal, urethral) ancillary only  ? Cytology (oral, anal, urethral) ancillary only  ? Urine cytology ancillary only  ? HIV Antibody (routine testing w rflx)  ? RPR  ? ? ? ?I have discontinued James Nay "Steve"'s sulfamethoxazole-trimethoprim, traMADol, and neomycin-polymyxin-dexamethasone. I am also having him maintain his acetaminophen, esomeprazole, docusate sodium, sildenafil, tobramycin-dexamethasone, and emtricitabine-tenofovir AF. ? ? ?Meds ordered this encounter  ?Medications  ? emtricitabine-tenofovir AF (DESCOVY) 200-25 MG tablet  ?  Sig: TAKE 1 TABLET BY MOUTH DAILY.  ?  Dispense:  30 tablet  ?  Refill:  3  ?  Order Specific Question:   Supervising Provider  ?  Answer:   Carlyle Basques [[id number]]  ? ? ? ?Follow-up: Return in about 3 months (around 09/05/2021), or if symptoms worsen or fail to improve. ? ? ?Terri Piedra, MSN, FNP-C ?Nurse Practitioner ?[place] for Infectious Disease ?[facility] ?[facility] Main number: [phone] ? ? ?

## 2021-06-06 ENCOUNTER — Ambulatory Visit: Payer: 59 | Admitting: Family

## 2021-06-06 LAB — URINE CYTOLOGY ANCILLARY ONLY
Chlamydia: NEGATIVE
Comment: NEGATIVE
Comment: NORMAL
Neisseria Gonorrhea: NEGATIVE

## 2021-06-06 LAB — CYTOLOGY, (ORAL, ANAL, URETHRAL) ANCILLARY ONLY
Chlamydia: NEGATIVE
Chlamydia: NEGATIVE
Comment: NEGATIVE
Comment: NEGATIVE
Comment: NORMAL
Comment: NORMAL
Neisseria Gonorrhea: NEGATIVE
Neisseria Gonorrhea: NEGATIVE

## 2021-06-06 LAB — RPR: RPR Ser Ql: NONREACTIVE

## 2021-06-06 LAB — HIV ANTIBODY (ROUTINE TESTING W REFLEX): HIV 1&2 Ab, 4th Generation: NONREACTIVE

## 2021-06-11 ENCOUNTER — Other Ambulatory Visit (HOSPITAL_COMMUNITY): Payer: Self-pay

## 2021-06-12 ENCOUNTER — Other Ambulatory Visit (HOSPITAL_COMMUNITY): Payer: Self-pay

## 2021-06-12 ENCOUNTER — Ambulatory Visit: Payer: 59 | Admitting: Family

## 2021-06-12 ENCOUNTER — Encounter: Payer: Self-pay | Admitting: Family

## 2021-06-12 ENCOUNTER — Other Ambulatory Visit: Payer: Self-pay

## 2021-06-12 VITALS — BP 164/82 | HR 85 | Temp 98.6°F | Ht 67.0 in | Wt 158.0 lb

## 2021-06-12 DIAGNOSIS — J40 Bronchitis, not specified as acute or chronic: Secondary | ICD-10-CM | POA: Insufficient documentation

## 2021-06-12 DIAGNOSIS — R051 Acute cough: Secondary | ICD-10-CM | POA: Insufficient documentation

## 2021-06-12 MED ORDER — BENZONATATE 200 MG PO CAPS
200.0000 mg | ORAL_CAPSULE | Freq: Three times a day (TID) | ORAL | 0 refills | Status: DC | PRN
  Filled 2021-06-12: qty 20, 7d supply, fill #0

## 2021-06-12 MED ORDER — DOXYCYCLINE HYCLATE 100 MG PO TABS
100.0000 mg | ORAL_TABLET | Freq: Two times a day (BID) | ORAL | 0 refills | Status: DC
  Filled 2021-06-12: qty 20, 10d supply, fill #0

## 2021-06-12 MED ORDER — DOXYCYCLINE HYCLATE 100 MG PO TABS
100.0000 mg | ORAL_TABLET | Freq: Two times a day (BID) | ORAL | 0 refills | Status: AC
  Filled 2021-06-12: qty 20, 10d supply, fill #0

## 2021-06-12 MED ORDER — METHYLPREDNISOLONE 4 MG PO TBPK
ORAL_TABLET | ORAL | 0 refills | Status: DC
  Filled 2021-06-12: qty 21, 6d supply, fill #0

## 2021-06-12 NOTE — Assessment & Plan Note (Addendum)
rx for tessalon perrles 200 mg ?rx for medrol dose pack ?

## 2021-06-12 NOTE — Patient Instructions (Signed)
Antibiotic sent to preferred pharmacy. Recommend starting flonase daily.  ? ?Please increase oral fluids, steamy hot shower/humidifier prn. ? ?Please follow up if no improvement in 2-3 days.  ? ?It was a pleasure seeing you today! Please do not hesitate to reach out with any questions and or concerns. ? ?Regards,  ? ?Layaan Mott ? ? ? ?

## 2021-06-12 NOTE — Progress Notes (Signed)
? ?Established Patient Office Visit ? ?Subjective:  ?Patient ID: James Barry, male    DOB: 12/20/1955  Age: 66 y.o. MRN: [id number] ? ?CC:  ?Chief Complaint  ?Patient presents with  ? Cough  ?  X 1 week home covid test neg.   ? ? ?HPI ?James Barry is here today with concerns.  ? ?Last Wednesday,  started with cough that is productive and chest congestion. Did have chills today, didn't notice fever. Last night had a coughing attack with nasal drainage, and had to raise bed to stop the drainage. Last week did have a sore throat which has since resolved. Last night cough with productive, at other times not as productive.no sob or wheezing. Bil ear fullness, does have some sinus pressure. ? ?Nyquil at night to help him sleep ?Dayquil during the day.  ? ?Had prostate surgery last year, wears depends ,so everytime coughs has incontinence so not very fun. ?Covid tested and was negative.  ? ?Past Medical History:  ?Diagnosis Date  ? Acute neck pain 08/19/2019  ? Acute thoracic back pain 07/30/2019  ? Arthritis   ? Cancer [facility])   ? prostate  ? Clavicular asymmetry 07/30/2019  ? Diverticulosis   ? Erectile dysfunction   ? GERD (gastroesophageal reflux disease)   ? Hyperlipidemia   ? Vertigo   ? ? ?Past Surgical History:  ?Procedure Laterality Date  ? COLONOSCOPY  03/19/2011  ? cleared for 5 yrs- Dr @ [facility])  ? HERNIA REPAIR    ? LYMPHADENECTOMY Bilateral 12/04/2020  ? Procedure: LYMPHADENECTOMY, PELVIC;  Surgeon: Raynelle Bring, MD;  Location: [facility];  Service: Urology;  Laterality: Bilateral;  ? ROBOT ASSISTED LAPAROSCOPIC RADICAL PROSTATECTOMY N/A 12/04/2020  ? Procedure: XI ROBOTIC ASSISTED LAPAROSCOPIC RADICAL PROSTATECTOMY LEVEL 2;  Surgeon: Raynelle Bring, MD;  Location: [facility];  Service: Urology;  Laterality: N/A;  ? ? ?Family History  ?Problem Relation Age of Onset  ? Breast cancer Mother   ? Lung cancer Father   ? Heart disease Father   ? Hypertension Father   ? Arthritis Maternal Grandmother    ? ? ?Social History  ? ?Socioeconomic History  ? Marital status: Married  ?  Spouse name: Not on file  ? Number of children: Not on file  ? Years of education: Not on file  ? Highest education level: Not on file  ?Occupational History  ? Not on file  ?Tobacco Use  ? Smoking status: Never  ? Smokeless tobacco: Never  ?Vaping Use  ? Vaping Use: Never used  ?Substance and Sexual Activity  ? Alcohol use: Yes  ?  Alcohol/week: 0.0 standard drinks  ?  Comment: social  ? Drug use: No  ? Sexual activity: Yes  ?  Partners: Male  ?  Comment: offered condoms  ?Other Topics Concern  ? Not on file  ?Social History Narrative  ? Married.  ? No children.  ? Retired.  ? 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  ? ?Social Determinants of Health  ? ?Financial Resource Strain: Not on file  ?Food Insecurity: Not on file  ?Transportation Needs: Not on file  ?Physical Activity: Not on file  ?Stress: Not on file  ?Social Connections: Not on file  ?Intimate Partner Violence: Not on file  ? ? ?Outpatient Medications Prior to Visit  ?Medication Sig Dispense Refill  ? acetaminophen (TYLENOL) 650 MG CR tablet Take 1,300 mg by mouth every morning.    ?  docusate sodium (COLACE) 100 MG capsule Take 1 capsule (100 mg total) by mouth 2 (two) times daily.    ? emtricitabine-tenofovir AF (DESCOVY) 200-25 MG tablet TAKE 1 TABLET BY MOUTH DAILY. 30 tablet 3  ? esomeprazole (NEXIUM) 40 MG capsule Take 1 capsule  by mouth daily for heartburn. 90 capsule 3  ? sildenafil (VIAGRA) 100 MG tablet Take 1 tablet by mouth 30-60 minutes prior to intercourse as needed. 90 tablet 0  ? tobramycin-dexamethasone (TOBRADEX) ophthalmic solution Instill 1 drop into both eyes four times a day 5 mL 0  ? ?No facility-administered medications prior to visit.  ? ? ?Allergies  ?Allergen Reactions  ? Buspirone   ?  Unaware   ? Cephalexin   ?  Unaware   ? ? ?ROS ?Review of Systems  ?Constitutional:  Negative for chills and fever.  ?HENT:   Positive for congestion, ear pain (bil fullness), sinus pain and sore throat.   ?Respiratory:  Positive for cough. Negative for shortness of breath and wheezing.   ?Cardiovascular:  Negative for chest pain and palpitations.  ? ?  ?Objective:  ?  ?Physical Exam ?Constitutional:   ?   General: He is awake. He is not in acute distress. ?   Appearance: Normal appearance. He is not ill-appearing.  ?HENT:  ?   Right Ear: Tympanic membrane normal.  ?   Left Ear: Tympanic membrane normal.  ?   Nose: Nose normal.  ?   Right Turbinates: Not enlarged or swollen.  ?   Left Turbinates: Not enlarged or swollen.  ?   Right Sinus: No maxillary sinus tenderness or frontal sinus tenderness.  ?   Left Sinus: No maxillary sinus tenderness or frontal sinus tenderness.  ?   Mouth/Throat:  ?   Mouth: Mucous membranes are moist.  ?   Pharynx: No pharyngeal swelling, oropharyngeal exudate or posterior oropharyngeal erythema.  ?Eyes:  ?   Extraocular Movements: Extraocular movements intact.  ?   Pupils: Pupils are equal, round, and reactive to light.  ?Cardiovascular:  ?   Rate and Rhythm: Normal rate and regular rhythm.  ?Pulmonary:  ?   Effort: Pulmonary effort is normal.  ?   Breath sounds: Normal breath sounds. No wheezing.  ?Neurological:  ?   Mental Status: He is alert.  ? ? ?BP (!) 164/82   Pulse 85   Temp 98.6 ?F (37 ?C) (Oral)   Ht $R'5\' 7"'Ov$  (1.702 m)   Wt 158 lb (71.7 kg)   SpO2 98%   BMI 24.75 kg/m?  ?Wt Readings from Last 3 Encounters:  ?06/12/21 158 lb (71.7 kg)  ?06/05/21 163 lb (73.9 kg)  ?02/21/21 159 lb 3.2 oz (72.2 kg)  ? ? ? ?There are no preventive care reminders to display for this patient. ? ?There are no preventive care reminders to display for this patient. ? ?No results found for: TSH ?Lab Results  ?Component Value Date  ? WBC 7.7 11/27/2020  ? HGB 10.8 (L) 12/05/2020  ? HCT 32.6 (L) 12/05/2020  ? MCV 88.6 11/27/2020  ? PLT 237 11/27/2020  ? ?Lab Results  ?Component Value Date  ? NA 141 02/21/2021  ? K 4.2  02/21/2021  ? CO2 25 02/21/2021  ? GLUCOSE 91 02/21/2021  ? BUN 12 02/21/2021  ? CREATININE 0.93 02/21/2021  ? BILITOT 0.2 02/21/2021  ? ALKPHOS 74 08/14/2018  ? AST 17 02/21/2021  ? ALT 17 02/21/2021  ? PROT 7.3 02/21/2021  ? ALBUMIN 4.4 08/14/2018  ?  CALCIUM 9.4 02/21/2021  ? ANIONGAP 6 12/05/2020  ? EGFR 91 02/21/2021  ? GFR 75.50 08/14/2018  ? ?No results found for: HGBA1C ? ?  ?Assessment & Plan:  ? ?Problem List Items Addressed This Visit   ? ?  ? Respiratory  ? Bronchitis  ?  rx sent for doxycycline 100 mg ?Take antibiotic as prescribed. Increase oral fluids. Pt to f/u if sx worsen and or fail to improve in 2-3 days. ? ?  ?  ? Relevant Medications  ? doxycycline (VIBRA-TABS) 100 MG tablet  ?  ? Other  ? Acute cough - Primary  ?  rx for tessalon perrles 200 mg ?rx for medrol dose pack ?  ?  ? Relevant Medications  ? benzonatate (TESSALON) 200 MG capsule  ? doxycycline (VIBRA-TABS) 100 MG tablet  ? methylPREDNISolone (MEDROL DOSEPAK) 4 MG TBPK tablet  ? ? ?Meds ordered this encounter  ?Medications  ? DISCONTD: benzonatate (TESSALON) 200 MG capsule  ?  Sig: Take 1 capsule by mouth 3 times daily as needed for cough.  ?  Dispense:  20 capsule  ?  Refill:  0  ?  Order Specific Question:   Supervising Provider  ?  Answer:   BEDSOLE, AMY E [[id number]]  ? DISCONTD: doxycycline (VIBRA-TABS) 100 MG tablet  ?  Sig: Take 1 tablet (100 mg total) by mouth 2 (two) times daily for 10 days.  ?  Dispense:  20 tablet  ?  Refill:  0  ?  Order Specific Question:   Supervising Provider  ?  Answer:   BEDSOLE, AMY E [[id number]]  ? benzonatate (TESSALON) 200 MG capsule  ?  Sig: Take 1 capsule by mouth 3 times daily as needed for cough.  ?  Dispense:  20 capsule  ?  Refill:  0  ?  Order Specific Question:   Supervising Provider  ?  Answer:   BEDSOLE, AMY E [[id number]]  ? doxycycline (VIBRA-TABS) 100 MG tablet  ?  Sig: Take 1 tablet (100 mg total) by mouth 2 (two) times daily for 10 days.  ?  Dispense:  20 tablet  ?  Refill:  0  ?  Order Specific  Question:   Supervising Provider  ?  Answer:   BEDSOLE, AMY E [[id number]]  ? methylPREDNISolone (MEDROL DOSEPAK) 4 MG TBPK tablet  ?  Sig: Take per package instructions  ?  Dispense:  21 tablet  ?  Refill:  0  ?  Order Specific

## 2021-06-12 NOTE — Telephone Encounter (Signed)
Patient was seen by [place] today. When I worked him up he expressed his disappointment on not being able to be seen by you.  ?

## 2021-06-12 NOTE — Assessment & Plan Note (Signed)
rx sent for doxycycline 100 mg ?Take antibiotic as prescribed. Increase oral fluids. Pt to f/u if sx worsen and or fail to improve in 2-3 days. ? ?

## 2021-06-14 DIAGNOSIS — I83813 Varicose veins of bilateral lower extremities with pain: Secondary | ICD-10-CM | POA: Diagnosis not present

## 2021-06-18 ENCOUNTER — Other Ambulatory Visit (HOSPITAL_COMMUNITY): Payer: Self-pay

## 2021-06-26 DIAGNOSIS — Q825 Congenital non-neoplastic nevus: Secondary | ICD-10-CM | POA: Diagnosis not present

## 2021-06-26 DIAGNOSIS — D485 Neoplasm of uncertain behavior of skin: Secondary | ICD-10-CM | POA: Diagnosis not present

## 2021-06-26 DIAGNOSIS — L570 Actinic keratosis: Secondary | ICD-10-CM | POA: Diagnosis not present

## 2021-06-26 DIAGNOSIS — L578 Other skin changes due to chronic exposure to nonionizing radiation: Secondary | ICD-10-CM | POA: Diagnosis not present

## 2021-06-26 DIAGNOSIS — L119 Acantholytic disorder, unspecified: Secondary | ICD-10-CM | POA: Diagnosis not present

## 2021-06-26 DIAGNOSIS — Z86018 Personal history of other benign neoplasm: Secondary | ICD-10-CM | POA: Diagnosis not present

## 2021-06-26 DIAGNOSIS — L918 Other hypertrophic disorders of the skin: Secondary | ICD-10-CM | POA: Diagnosis not present

## 2021-06-26 DIAGNOSIS — Z872 Personal history of diseases of the skin and subcutaneous tissue: Secondary | ICD-10-CM | POA: Diagnosis not present

## 2021-07-02 ENCOUNTER — Other Ambulatory Visit (HOSPITAL_COMMUNITY): Payer: Self-pay

## 2021-07-09 ENCOUNTER — Other Ambulatory Visit (HOSPITAL_COMMUNITY): Payer: Self-pay

## 2021-07-19 ENCOUNTER — Encounter: Payer: Self-pay | Admitting: Cardiology

## 2021-07-19 ENCOUNTER — Ambulatory Visit: Payer: 59 | Admitting: Cardiology

## 2021-07-19 ENCOUNTER — Other Ambulatory Visit (HOSPITAL_COMMUNITY): Payer: Self-pay

## 2021-07-19 ENCOUNTER — Other Ambulatory Visit: Payer: Self-pay

## 2021-07-19 VITALS — BP 136/98 | HR 68 | Ht 67.0 in | Wt 156.0 lb

## 2021-07-19 DIAGNOSIS — R030 Elevated blood-pressure reading, without diagnosis of hypertension: Secondary | ICD-10-CM

## 2021-07-19 DIAGNOSIS — R072 Precordial pain: Secondary | ICD-10-CM

## 2021-07-19 DIAGNOSIS — Z01812 Encounter for preprocedural laboratory examination: Secondary | ICD-10-CM | POA: Diagnosis not present

## 2021-07-19 LAB — BASIC METABOLIC PANEL
BUN/Creatinine Ratio: 14 (ref 10–24)
BUN: 14 mg/dL (ref 8–27)
CO2: 26 mmol/L (ref 20–29)
Calcium: 9.6 mg/dL (ref 8.6–10.2)
Chloride: 98 mmol/L (ref 96–106)
Creatinine, Ser: 0.97 mg/dL (ref 0.76–1.27)
Glucose: 90 mg/dL (ref 70–99)
Potassium: 4.5 mmol/L (ref 3.5–5.2)
Sodium: 138 mmol/L (ref 134–144)
eGFR: 87 mL/min/{1.73_m2} (ref >59)

## 2021-07-19 MED ORDER — METOPROLOL TARTRATE 50 MG PO TABS
50.0000 mg | ORAL_TABLET | ORAL | 0 refills | Status: DC
  Filled 2021-07-19: qty 1, 1d supply, fill #0

## 2021-07-19 NOTE — Assessment & Plan Note (Signed)
Does admit to some whitecoat hypertension, situational high blood pressure.  Continue to monitor at home.  May need antihypertensive ?

## 2021-07-19 NOTE — Progress Notes (Signed)
?Cardiology Office Note:   ? ?Date:  07/19/2021  ? ?ID:  James Barry, DOB [birth year], MRN [id number] ? ?PCP:  Pleas Koch, NP ?  ?[facility] Providers ?Cardiologist:  None    ? ?Referring MD: Pleas Koch, NP  ? ? ?History of Present Illness:   ? ?James Barry is a 66 y.o. male here for the evaluation of chest discomfort.  He was here with his spouse, James Barry today who was evaluated as well for chest discomfort.  During this visit, he did mention that he was having similar symptoms.  Had this over the past few years. In the past he has had a stress test which was unremarkable.  Personally reviewed the findings.  Exercised for 6 minutes and 51 seconds without any significant ST changes.  This was in 2020. ? ?Came back recently but chest pain left down left arm. No sweats. No jaw pain. Lasts 10-15 minutes.  Noticed it during stress. ? ?Does [place].  ? ?No smoking.  ? ?Father had CAD, stents. Mother died [place]reat CA 16 ? ?Saw some plaque on heart previously (? Valve).  ? ?Past Medical History:  ?Diagnosis Date  ? Acute neck pain 08/19/2019  ? Acute thoracic back pain 07/30/2019  ? Arthritis   ? 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 Clavicular asymmetry 07/30/2019  ? Diverticulosis   ? Erectile dysfunction   ? GERD (gastroesophageal reflux disease)   ? Hyperlipidemia   ? Vertigo   ? ? ?Past Surgical History:  ?Procedure Laterality Date  ? COLONOSCOPY  03/19/2011  ? cleared for 5 yrs- Dr @ [facility])  ? HERNIA REPAIR    ? LYMPHADENECTOMY Bilateral 12/04/2020  ? Procedure: LYMPHADENECTOMY, PELVIC;  Surgeon: Raynelle Bring, MD;  Location: [facility];  Service: Urology;  Laterality: Bilateral;  ? ROBOT ASSISTED LAPAROSCOPIC RADICAL PROSTATECTOMY N/A 12/04/2020  ? Procedure: XI ROBOTIC ASSISTED LAPAROSCOPIC RADICAL PROSTATECTOMY LEVEL 2;  Surgeon: Raynelle Bring, MD;  Location: [facility];  Service: Urology;  Laterality: N/A;  ? ? ?Current Medications: ?Current Meds  ?Medication Sig  ? metoprolol  tartrate (LOPRESSOR) 50 MG tablet Take one tablet (2) hours before your CT scan as directed.  ?  ? ?Allergies:   Buspirone and Cephalexin  ? ?Social History  ? ?Socioeconomic History  ? Marital status: Married  ?  Spouse name: Not on file  ? Number of children: Not on file  ? Years of education: Not on file  ? Highest education level: Not on file  ?Occupational History  ? Not on file  ?Tobacco Use  ? Smoking status: Never  ? Smokeless tobacco: Never  ?Vaping Use  ? Vaping Use: Never used  ?Substance and Sexual Activity  ? Alcohol use: Yes  ?  Alcohol/week: 0.0 standard drinks  ?  Comment: social  ? Drug use: No  ? Sexual activity: Yes  ?  Partners: Male  ?  Comment: offered condoms  ?Other Topics Concern  ? Not on file  ?Social History Narrative  ? Married.  ? No children.  ? Retired.  ? 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  ? ?Social Determinants of Health  ? ?Financial Resource Strain: Not on file  ?Food Insecurity: Not on file  ?Transportation Needs: Not on file  ?Physical Activity: Not on file  ?Stress: Not on file  ?Social Connections: Not on file  ?  ? ?Family History: ?The patient's family history includes Arthritis  in his maternal grandmother; Breast cancer in his mother; Heart disease in his father; Hypertension in his father; Lung cancer in his father. ? ?ROS:   ?Please see the history of present illness.    ? All other systems reviewed and are negative. ? ?EKGs/Labs/Other Studies Reviewed:   ? ?The following studies were reviewed today: ?As above ? ?EKG:  EKG is  ordered today.  The ekg ordered today demonstrates sinus rhythm 66 no other abnormalities ? ?Recent Labs: ?11/27/2020: Platelets 237 ?12/05/2020: Hemoglobin 10.8 ?02/21/2021: ALT 17; BUN 12; Creat 0.93; Potassium 4.2; Sodium 141  ?Recent Lipid Panel ?   ?Component Value Date/Time  ? CHOL 175 08/29/2020 0824  ? CHOL 186 06/28/2015 0950  ? TRIG 182.0 (H) 08/29/2020 0824  ? HDL 42.90 08/29/2020 0824  ? HDL 51  06/28/2015 0950  ? CHOLHDL 4 08/29/2020 0824  ? VLDL 36.4 08/29/2020 0824  ? [place] 96 08/29/2020 0824  ? LDLCALC 119 (H) 06/28/2015 0950  ? ? ? ?Risk Assessment/Calculations:   ? ? ?    ? ?   ? ?Physical Exam:   ? ?VS:  BP (!) 136/98   Pulse 68   Ht '5\' 7"'$  (1.702 m)   BMI 24.75 kg/m?    ? ?Wt Readings from Last 3 Encounters:  ?06/12/21 158 lb (71.7 kg)  ?06/05/21 163 lb (73.9 kg)  ?02/21/21 159 lb 3.2 oz (72.2 kg)  ?  ? ?GEN:  Well nourished, well developed in no acute distress ?HEENT: Normal ?NECK: No JVD; No carotid bruits ?LYMPHATICS: No lymphadenopathy ?CARDIAC: RRR, no murmurs, no rubs, gallops ?RESPIRATORY:  Clear to auscultation without rales, wheezing or rhonchi  ?ABDOMEN: Soft, non-tender, non-distended ?MUSCULOSKELETAL:  No edema; No deformity  ?SKIN: Warm and dry ?NEUROLOGIC:  Alert and oriented x 3 ?PSYCHIATRIC:  Normal affect  ? ?ASSESSMENT:   ? ?1. Precordial pain   ?2. Pre-procedure lab exam   ?3. Precordial chest pain   ?4. Elevated blood pressure reading   ? ?PLAN:   ? ?In order of problems listed above: ? ?Precordial chest pain ?Left-sided chest discomfort radiating down his left arm during periods of stress.  His father had coronary disease with stent placement.  We will go ahead and check a coronary CT scan to make sure he does not have any evidence of significant CAD present.  He has had a PET scan in the past and was told that he had some mild plaque perhaps on his valve. ? ?Elevated blood pressure reading ?Does admit to some whitecoat hypertension, situational high blood pressure.  Continue to monitor at home.  May need antihypertensive ?  ? ? ?  ? ? ?Medication Adjustments/Labs and Tests Ordered: ?Current medicines are reviewed at length with the patient today.  Concerns regarding medicines are outlined above.  ?Orders Placed This Encounter  ?Procedures  ? CT CORONARY MORPH W/CTA COR W/SCORE W/CA W/CM &/OR WO/CM  ? Basic metabolic panel  ? ?Meds ordered this encounter  ?Medications  ?  metoprolol tartrate (LOPRESSOR) 50 MG tablet  ?  Sig: Take one tablet (2) hours before your CT scan as directed.  ?  Dispense:  1 tablet  ?  Refill:  0  ? ? ?Patient Instructions  ?Medication Instructions:  ?The current medical regimen is effective;  continue present plan and medications. ? ?*If you need a refill on your cardiac medications before your next appointment, please call your pharmacy* ? ? ?Lab Work: ?Please have blood work today (BMP) ? ?If you  have labs (blood work) drawn today and your tests are completely normal, you will receive your results only by: ?MyChart Message (if you have MyChart) OR ?A paper copy in the mail ?If you have any lab test that is abnormal or we need to change your treatment, we will call you to review the results. ? ? ?Testing/Procedures: ? ? ?Your cardiac CT will be scheduled at:  ? ?[facility] ?[address] ?[place], [place] [address] ?[phone] ? ?Please arrive at the [facility] and Children's Entrance (Entrance C2) of [facility] 30 minutes prior to test start time. ?You can use the FREE valet parking offered at entrance C (encouraged to control the heart rate for the test)  ?Proceed to the [facility] Radiology Department (first floor) to check-in and test prep. ? ?All radiology patients and guests should use entrance C2 at [facility], accessed from [facility], even though the hospital's physical address listed is [address]. ? ? ? ? ?Please follow these instructions carefully (unless otherwise directed): ? ?Hold all erectile dysfunction medications at least 3 days (72 hrs) prior to test. ? ?On the Night Before the Test: ?Be sure to Drink plenty of water. ?Do not consume any caffeinated/decaffeinated beverages or chocolate 12 hours prior to your test. ?Do not take any antihistamines 12 hours prior to your test. ? ?On the Day of the Test: ?Drink plenty of water until 1 hour prior to the test. ?Do not eat any food 4  hours prior to the test. ?You may take your regular medications prior to the test.  ?Take metoprolol (Lopressor) two hours prior to test. ?HOLD Furosemide/Hydrochlorothiazide morning of the test. ? ?After the Test: ?Dr

## 2021-07-19 NOTE — Patient Instructions (Signed)
Medication Instructions:  ?The current medical regimen is effective;  continue present plan and medications. ? ?*If you need a refill on your cardiac medications before your next appointment, please call your pharmacy* ? ? ?Lab Work: ?Please have blood work today (BMP) ? ?If you have labs (blood work) drawn today and your tests are completely normal, you will receive your results only by: ?MyChart Message (if you have MyChart) OR ?A paper copy in the mail ?If you have any lab test that is abnormal or we need to change your treatment, we will call you to review the results. ? ? ?Testing/Procedures: ? ? ?Your cardiac CT will be scheduled at:  ? ?[facility] ?[address] ?[place], [place] [address] ?[phone] ? ?Please arrive at the [facility] and Children's Entrance (Entrance C2) of [facility] 30 minutes prior to test start time. ?You can use the FREE valet parking offered at entrance C (encouraged to control the heart rate for the test)  ?Proceed to the [facility] Radiology Department (first floor) to check-in and test prep. ? ?All radiology patients and guests should use entrance C2 at [facility], accessed from [facility], even though the hospital's physical address listed is [address] ? ? ? ? ?Please follow these instructions carefully (unless otherwise directed): ? ?Hold all erectile dysfunction medications at least 3 days (72 hrs) prior to test. ? ?On the Night Before the Test: ?Be sure to Drink plenty of water. ?Do not consume any caffeinated/decaffeinated beverages or chocolate 12 hours prior to your test. ?Do not take any antihistamines 12 hours prior to your test. ? ?On the Day of the Test: ?Drink plenty of water until 1 hour prior to the test. ?Do not eat any food 4 hours prior to the test. ?You may take your regular medications prior to the test.  ?Take metoprolol (Lopressor) two hours prior to test. ?HOLD Furosemide/Hydrochlorothiazide morning  of the test. ? ?After the Test: ?Drink plenty of water. ?After receiving IV contrast, you may experience a mild flushed feeling. This is normal. ?On occasion, you may experience a mild rash up to 24 hours after the test. This is not dangerous. If this occurs, you can take Benadryl 25 mg and increase your fluid intake. ?If you experience trouble breathing, this can be serious. If it is severe call 911 IMMEDIATELY. If it is mild, please call our office. ?If you take any of these medications: Glipizide/Metformin, Avandament, Glucavance, please do not take 48 hours after completing test unless otherwise instructed. ? ?We will call to schedule your test 2-4 weeks out understanding that some insurance companies will need an authorization prior to the service being performed.  ? ?For non-scheduling related questions, please contact the cardiac imaging nurse navigator should you have any questions/concerns: ?Marchia Bond, Cardiac Imaging Nurse Navigator ?Gordy Clement, Cardiac Imaging Nurse Navigator ?[facility] ?Direct Office Dial: [phone]  ? ?For scheduling needs, including cancellations and rescheduling, please call [place], [phone]. ? ? ? ?Follow-Up: ?At [facility], you and your health needs are our priority.  As part of our continuing mission to provide you with exceptional heart care, we have created designated Provider Care Teams.  These Care Teams include your primary Cardiologist (physician) and Advanced Practice Providers (APPs -  Physician Assistants and Nurse Practitioners) who all work together to provide you with the care you need, when you need it. ? ?We recommend signing up for the patient portal called "  MyChart".  Sign up information is provided on this After Visit Summary.  MyChart is used to connect with patients for Virtual Visits (Telemedicine).  Patients are able to view lab/test results, encounter notes, upcoming appointments, etc.  Non-urgent messages can  be sent to your provider as well.   ?To learn more about what you can do with MyChart, go to [email or web address].   ? ?Your next appointment:   ?Follow up will be determine after the above testing. ? ?Important Information About Sugar ? ? ? ? ?  ?

## 2021-07-19 NOTE — Addendum Note (Signed)
Addended by: Michelle Nasuti on: 07/19/2021 11:37 AM ? ? Modules accepted: Orders ? ?

## 2021-07-19 NOTE — Assessment & Plan Note (Signed)
Left-sided chest discomfort radiating down his left arm during periods of stress.  His father had coronary disease with stent placement.  We will go ahead and check a coronary CT scan to make sure he does not have any evidence of significant CAD present.  He has had a PET scan in the past and was told that he had some mild plaque perhaps on his valve. ?

## 2021-07-23 ENCOUNTER — Other Ambulatory Visit: Payer: Self-pay | Admitting: Primary Care

## 2021-07-23 ENCOUNTER — Other Ambulatory Visit (HOSPITAL_COMMUNITY): Payer: Self-pay

## 2021-07-23 DIAGNOSIS — N529 Male erectile dysfunction, unspecified: Secondary | ICD-10-CM

## 2021-07-23 MED ORDER — SILDENAFIL CITRATE 100 MG PO TABS
ORAL_TABLET | ORAL | 0 refills | Status: DC
  Filled 2021-07-23: qty 90, 90d supply, fill #0

## 2021-07-25 DIAGNOSIS — N393 Stress incontinence (female) (male): Secondary | ICD-10-CM | POA: Diagnosis not present

## 2021-07-25 DIAGNOSIS — M62838 Other muscle spasm: Secondary | ICD-10-CM | POA: Diagnosis not present

## 2021-07-25 DIAGNOSIS — M6281 Muscle weakness (generalized): Secondary | ICD-10-CM | POA: Diagnosis not present

## 2021-08-01 DIAGNOSIS — L570 Actinic keratosis: Secondary | ICD-10-CM | POA: Diagnosis not present

## 2021-08-01 DIAGNOSIS — L918 Other hypertrophic disorders of the skin: Secondary | ICD-10-CM | POA: Diagnosis not present

## 2021-08-02 ENCOUNTER — Other Ambulatory Visit (HOSPITAL_COMMUNITY): Payer: Self-pay

## 2021-08-06 ENCOUNTER — Other Ambulatory Visit (HOSPITAL_COMMUNITY): Payer: Self-pay

## 2021-08-21 ENCOUNTER — Telehealth (HOSPITAL_COMMUNITY): Payer: Self-pay | Admitting: *Deleted

## 2021-08-21 NOTE — Telephone Encounter (Signed)
Patient's husband calling regarding upcoming cardiac imaging study; pt verbalizes understanding of appt date/time, parking situation and where to check in, pre-test NPO status and medications ordered name and call back number provided for further questions should they arise  James Clement RN Navigator Cardiac Imaging [facility] [phone] office [phone] cell  Patient to take '50mg'$  metoprolol tartrate two hours prior to his cardiac CT scan.  His husband is aware that patient should arrive at 8am.

## 2021-08-23 ENCOUNTER — Ambulatory Visit (HOSPITAL_COMMUNITY)
Admission: RE | Admit: 2021-08-23 | Discharge: 2021-08-23 | Disposition: A | Discharge status: Home / Self Care | Payer: 59 | Source: Ambulatory Visit | Attending: Cardiology | Admitting: Cardiology

## 2021-08-23 DIAGNOSIS — R072 Precordial pain: Secondary | ICD-10-CM | POA: Diagnosis not present

## 2021-08-23 MED ORDER — NITROGLYCERIN 0.4 MG SL SUBL
0.8000 mg | SUBLINGUAL_TABLET | Freq: Once | SUBLINGUAL | Status: AC
  Administered 2021-08-23: 0.8 mg via SUBLINGUAL

## 2021-08-23 MED ORDER — NITROGLYCERIN 0.4 MG SL SUBL
SUBLINGUAL_TABLET | SUBLINGUAL | Status: AC
  Filled 2021-08-23: qty 2

## 2021-08-23 MED ORDER — IOHEXOL 350 MG/ML SOLN
100.0000 mL | Freq: Once | INTRAVENOUS | Status: AC | PRN
  Administered 2021-08-23: 100 mL via INTRAVENOUS

## 2021-08-24 ENCOUNTER — Other Ambulatory Visit (HOSPITAL_COMMUNITY): Payer: Self-pay

## 2021-08-25 ENCOUNTER — Other Ambulatory Visit (HOSPITAL_COMMUNITY): Payer: Self-pay

## 2021-08-25 ENCOUNTER — Encounter: Payer: Self-pay | Admitting: Cardiology

## 2021-08-25 DIAGNOSIS — I2510 Atherosclerotic heart disease of native coronary artery without angina pectoris: Secondary | ICD-10-CM

## 2021-08-25 DIAGNOSIS — R931 Abnormal findings on diagnostic imaging of heart and coronary circulation: Secondary | ICD-10-CM

## 2021-08-27 ENCOUNTER — Encounter: Payer: Self-pay | Admitting: Family

## 2021-08-27 ENCOUNTER — Encounter: Payer: Self-pay | Admitting: Family Medicine

## 2021-08-27 ENCOUNTER — Other Ambulatory Visit (HOSPITAL_COMMUNITY): Payer: Self-pay

## 2021-08-27 ENCOUNTER — Ambulatory Visit: Payer: 59 | Admitting: Family Medicine

## 2021-08-27 VITALS — BP 130/90 | HR 79 | Temp 98.2°F | Ht 67.0 in | Wt 156.5 lb

## 2021-08-27 DIAGNOSIS — J029 Acute pharyngitis, unspecified: Secondary | ICD-10-CM

## 2021-08-27 DIAGNOSIS — R051 Acute cough: Secondary | ICD-10-CM

## 2021-08-27 DIAGNOSIS — B349 Viral infection, unspecified: Secondary | ICD-10-CM

## 2021-08-27 LAB — POC COVID19 BINAXNOW: SARS Coronavirus 2 Ag: NEGATIVE

## 2021-08-27 LAB — POCT RAPID STREP A (OFFICE): Rapid Strep A Screen: NEGATIVE

## 2021-08-27 MED ORDER — BENZONATATE 200 MG PO CAPS
200.0000 mg | ORAL_CAPSULE | Freq: Three times a day (TID) | ORAL | 1 refills | Status: DC | PRN
  Filled 2021-08-27: qty 40, 14d supply, fill #0

## 2021-08-27 MED ORDER — DOXYCYCLINE HYCLATE 100 MG PO TABS
100.0000 mg | ORAL_TABLET | Freq: Two times a day (BID) | ORAL | 0 refills | Status: DC
  Filled 2021-08-27: qty 20, 10d supply, fill #0

## 2021-08-27 MED ORDER — ROSUVASTATIN CALCIUM 10 MG PO TABS
10.0000 mg | ORAL_TABLET | Freq: Every day | ORAL | 3 refills | Status: DC
  Filled 2021-08-27: qty 90, 90d supply, fill #0
  Filled 2021-11-14: qty 90, 90d supply, fill #1
  Filled 2022-02-06: qty 90, 90d supply, fill #2
  Filled 2022-05-06: qty 90, 90d supply, fill #3

## 2021-08-27 NOTE — Telephone Encounter (Signed)
Called pt reviewed results of CT and MD recommendations.  Pt is agreeable to plan of care.  Orders placed pt will send in a my chart message with date of 2 month f/u lab results.

## 2021-08-27 NOTE — Progress Notes (Signed)
Ikea Demicco T. Lucella Pommier, MD, [place] at [facility] [place], [address]  Phone: [phone]  FAX: [place] - 66 y.o. male  MRN [id number]  Date of Birth: 03/08/1956  Date: 08/27/2021  PCP: Pleas Koch, NP  Referral: Pleas Koch, NP  Chief Complaint  Patient presents with   Sore Throat    Symptoms started early Friday morning Negative home Covid test 2 days ago   Cough   Subjective:   James Barry is a 66 y.o. very pleasant male patient with Body mass index is 24.51 kg/m. who presents with the following:  He is a very nice patient, and I recall him quite well from prior office visits.  He has had multiple sick exposures.  Went to an outdoor wedding on Sat night.  Nose was running some on Friday.  Has had all vaccines.  Thought that it might have been allergies.  Wedding was Sat afternoon.  Then had a runny nose, and early Sun morning throat got really sore.    Not aching at all.  No headache.  In the early morning he felt soaked shirt and through to his underwear.  Did take some nyquil.  Review of Systems is noted in the HPI, as appropriate  Objective:   BP 130/90   Pulse 79   Temp 98.2 F (36.8 C) (Oral)   Ht '5\' 7"'$  (1.702 m)   Wt 156 lb 8 oz (71 kg)   SpO2 98%   BMI 24.51 kg/m   Gen: WDWN, NAD. Globally Non-toxic HEENT: Throat clear, w/o exudate, R TM clear, L TM - good landmarks, No fluid present. rhinnorhea.  MMM Frontal sinuses: NT Max sinuses: NT NECK: Anterior cervical  LAD is absent CV: RRR, No M/G/R, cap refill <2 sec PULM: Breathing comfortably in no respiratory distress. no wheezing, crackles, rhonchi   Laboratory and Imaging Data: Results for orders placed or performed in visit on 08/27/21  POCT rapid strep A  Result Value Ref Range   Rapid Strep A Screen Negative Negative  POC COVID-19  Result Value Ref Range   SARS Coronavirus 2 Ag Negative Negative      Assessment and Plan:     ICD-10-CM   1. Viral syndrome  B34.9     2. Acute cough  R05.1 POC COVID-19    3. Sore throat  J02.9 POCT rapid strep A    POC COVID-19     Acute cough and sore throat times several days.  Multiple sick exposures.  He does not have COVID or strep, with negative test.  Likely viral syndrome that will resolve on its own with some supportive care.  If he is not improving by the end of the week, be reasonable for him to pick up doxycycline prescription that I sent.  I am gonna have him start some Tessalon as needed for cough now.  Medication Management during today's office visit: Meds ordered this encounter  Medications   benzonatate (TESSALON) 200 MG capsule    Sig: Take 1 capsule (200 mg total) by mouth 3 (three) times daily as needed for cough.    Dispense:  40 capsule    Refill:  1   doxycycline (VIBRA-TABS) 100 MG tablet    Sig: Take 1 tablet (100 mg total) by mouth 2 (two) times daily.    Dispense:  20 tablet    Refill:  0   There are no  discontinued medications.  Orders placed today for conditions managed today: Orders Placed This Encounter  Procedures   POCT rapid strep A   POC COVID-19    Follow-up if needed: No follow-ups on file.  Dragon [employer] One speech-to-text software was used for transcription in this dictation.  Possible transcriptional errors can occur using Editor, commissioning.   Signed,  Maud Deed. Verline Kong, MD   Outpatient Encounter Medications as of 08/27/2021  Medication Sig   acetaminophen (TYLENOL) 650 MG CR tablet Take 1,300 mg by mouth every morning.   benzonatate (TESSALON) 200 MG capsule Take 1 capsule (200 mg total) by mouth 3 (three) times daily as needed for cough.   docusate sodium (COLACE) 100 MG capsule Take 1 capsule (100 mg total) by mouth 2 (two) times daily.   doxycycline (VIBRA-TABS) 100 MG tablet Take 1 tablet (100 mg total) by mouth 2 (two) times daily.   emtricitabine-tenofovir AF (DESCOVY) 200-25  MG tablet TAKE 1 TABLET BY MOUTH DAILY.   esomeprazole (NEXIUM) 40 MG capsule Take 1 capsule  by mouth daily for heartburn.   metoprolol tartrate (LOPRESSOR) 50 MG tablet Take one tablet (2) hours before your CT scan as directed.   rosuvastatin (CRESTOR) 10 MG tablet Take 1 tablet (10 mg total) by mouth daily.   sildenafil (VIAGRA) 100 MG tablet Take 1 tablet by mouth 30-60 minutes prior to intercourse as needed.   tobramycin-dexamethasone (TOBRADEX) ophthalmic solution Instill 1 drop into both eyes four times a day   No facility-administered encounter medications on file as of 08/27/2021.

## 2021-08-27 NOTE — Telephone Encounter (Signed)
Called patient added to see Dr. Lorelei Pont today

## 2021-08-30 DIAGNOSIS — L814 Other melanin hyperpigmentation: Secondary | ICD-10-CM | POA: Diagnosis not present

## 2021-08-30 DIAGNOSIS — L918 Other hypertrophic disorders of the skin: Secondary | ICD-10-CM | POA: Diagnosis not present

## 2021-09-05 ENCOUNTER — Other Ambulatory Visit: Payer: Self-pay

## 2021-09-05 ENCOUNTER — Other Ambulatory Visit (HOSPITAL_COMMUNITY): Payer: Self-pay

## 2021-09-05 ENCOUNTER — Other Ambulatory Visit (HOSPITAL_COMMUNITY)
Admission: RE | Admit: 2021-09-05 | Discharge: 2021-09-05 | Disposition: A | Discharge status: Home / Self Care | Payer: 59 | Source: Ambulatory Visit | Attending: Family | Admitting: Family

## 2021-09-05 ENCOUNTER — Encounter: Payer: Self-pay | Admitting: Family

## 2021-09-05 ENCOUNTER — Encounter: Payer: 59 | Admitting: Primary Care

## 2021-09-05 ENCOUNTER — Ambulatory Visit: Payer: 59 | Admitting: Family

## 2021-09-05 VITALS — BP 143/88 | HR 66 | Temp 97.8°F | Wt 158.4 lb

## 2021-09-05 DIAGNOSIS — Z113 Encounter for screening for infections with a predominantly sexual mode of transmission: Secondary | ICD-10-CM | POA: Diagnosis not present

## 2021-09-05 DIAGNOSIS — Z79899 Other long term (current) drug therapy: Secondary | ICD-10-CM

## 2021-09-05 DIAGNOSIS — Z5181 Encounter for therapeutic drug level monitoring: Secondary | ICD-10-CM | POA: Diagnosis not present

## 2021-09-05 DIAGNOSIS — C61 Malignant neoplasm of prostate: Secondary | ICD-10-CM | POA: Diagnosis not present

## 2021-09-05 MED ORDER — EMTRICITABINE-TENOFOVIR AF 200-25 MG PO TABS
1.0000 | ORAL_TABLET | Freq: Every day | ORAL | 3 refills | Status: DC
  Filled 2021-09-05: qty 30, 30d supply, fill #0
  Filled 2021-10-02: qty 30, 30d supply, fill #1
  Filled 2021-11-09: qty 30, 30d supply, fill #2
  Filled 2021-12-12: qty 30, 30d supply, fill #3

## 2021-09-05 NOTE — Progress Notes (Signed)
Subjective:    Patient ID: James Barry, male    DOB: February 23, 1956, 66 y.o.   MRN: [id number]  Chief Complaint  Patient presents with   PrEP    HPI:  James Barry is a 66 y.o. male on pre-exposure on prophylaxis for HIV last seen on 06/05/21 with good adherence and tolerance to Descovy. HIV and STD testing was negative. Here today for follow up.  James Barry continues to take his Descovy daily as prescribed with no adverse side effects.  Overall feeling well today with no new concerns/complaints. Denies fevers, chills, night sweats, headaches, changes in vision, neck pain/stiffness, nausea, diarrhea, vomiting, lesions or rashes.  No problems obtaining medication from the pharmacy.  Condoms offered.   Allergies  Allergen Reactions   Buspirone     Unaware    Cephalexin     Unaware       Outpatient Medications Prior to Visit  Medication Sig Dispense Refill   acetaminophen (TYLENOL) 650 MG CR tablet Take 1,300 mg by mouth every morning.     benzonatate (TESSALON) 200 MG capsule Take 1 capsule (200 mg total) by mouth 3 (three) times daily as needed for cough. 40 capsule 1   docusate sodium (COLACE) 100 MG capsule Take 1 capsule (100 mg total) by mouth 2 (two) times daily.     doxycycline (VIBRA-TABS) 100 MG tablet Take 1 tablet (100 mg total) by mouth 2 (two) times daily. 20 tablet 0   esomeprazole (NEXIUM) 40 MG capsule Take 1 capsule  by mouth daily for heartburn. 90 capsule 3   metoprolol tartrate (LOPRESSOR) 50 MG tablet Take one tablet (2) hours before your CT scan as directed. 1 tablet 0   rosuvastatin (CRESTOR) 10 MG tablet Take 1 tablet (10 mg total) by mouth daily. 90 tablet 3   sildenafil (VIAGRA) 100 MG tablet Take 1 tablet by mouth 30-60 minutes prior to intercourse as needed. 90 tablet 0   tobramycin-dexamethasone (TOBRADEX) ophthalmic solution Instill 1 drop into both eyes four times a day 5 mL 0   emtricitabine-tenofovir AF (DESCOVY) 200-25 MG tablet TAKE 1 TABLET BY  MOUTH DAILY. 30 tablet 3   No facility-administered medications prior to visit.     Past Medical History:  Diagnosis Date   Acute neck pain 08/19/2019   Acute thoracic back pain 07/30/2019   Arthritis    Cancer ([place])    prostate   Clavicular asymmetry 07/30/2019   Diverticulosis    Erectile dysfunction    GERD (gastroesophageal reflux disease)    Hyperlipidemia    Vertigo      Past Surgical History:  Procedure Laterality Date   COLONOSCOPY  03/19/2011   cleared for 5 yrs- Dr @ [facility] Narda Amber)   [place] Bilateral 12/04/2020   Procedure: LYMPHADENECTOMY, PELVIC;  Surgeon: Raynelle Bring, MD;  Location: [facility];  Service: Urology;  Laterality: Bilateral;   ROBOT ASSISTED LAPAROSCOPIC RADICAL PROSTATECTOMY N/A 12/04/2020   Procedure: XI ROBOTIC ASSISTED LAPAROSCOPIC RADICAL PROSTATECTOMY LEVEL 2;  Surgeon: Raynelle Bring, MD;  Location: [facility];  Service: Urology;  Laterality: N/A;       Review of Systems  Constitutional:  Negative for appetite change, chills, fatigue, fever and unexpected weight change.  Eyes:  Negative for visual disturbance.  Respiratory:  Negative for cough, chest tightness, shortness of breath and wheezing.   Cardiovascular:  Negative for chest pain and leg swelling.  Gastrointestinal:  Negative for abdominal pain, constipation, diarrhea, nausea  and vomiting.  Genitourinary:  Negative for dysuria, flank pain, frequency, genital sores, hematuria and urgency.  Skin:  Negative for rash.  Allergic/Immunologic: Negative for immunocompromised state.  Neurological:  Negative for dizziness and headaches.      Objective:    BP (!) 143/88   Pulse 66   Temp 97.8 F (36.6 C) (Oral)   Wt 158 lb 6.4 oz (71.8 kg)   SpO2 96%   BMI 24.81 kg/m  Nursing note and vital signs reviewed.  Physical Exam Constitutional:      General: He is not in acute distress.    Appearance: He is well-developed.  Eyes:      Conjunctiva/sclera: Conjunctivae normal.  Cardiovascular:     Rate and Rhythm: Normal rate and regular rhythm.     Heart sounds: Normal heart sounds. No murmur heard.    No friction rub. No gallop.  Pulmonary:     Effort: Pulmonary effort is normal. No respiratory distress.     Breath sounds: Normal breath sounds. No wheezing or rales.  Chest:     Chest wall: No tenderness.  Abdominal:     General: Bowel sounds are normal.     Palpations: Abdomen is soft.     Tenderness: There is no abdominal tenderness.  Musculoskeletal:     Cervical back: Neck supple.  Lymphadenopathy:     Cervical: No cervical adenopathy.  Skin:    General: Skin is warm and dry.     Findings: No rash.  Neurological:     Mental Status: He is alert and oriented to person, place, and time.  Psychiatric:        Behavior: Behavior normal.        Thought Content: Thought content normal.        Judgment: Judgment normal.         09/05/2021    1:43 PM 06/05/2021    1:45 PM 11/22/2020    3:09 PM 08/29/2020    7:45 AM 08/07/2020    2:02 PM  Depression screen PHQ 2/9  Decreased Interest 0 0 0 0 0  Down, Depressed, Hopeless 0 1 0 0 0  PHQ - 2 Score 0 1 0 0 0  Altered sleeping    0   Tired, decreased energy    0   Change in appetite    0   Feeling bad or failure about yourself     0   Trouble concentrating    0   Moving slowly or fidgety/restless    0   Suicidal thoughts    0   PHQ-9 Score    0   Difficult doing work/chores    Not difficult at all        Assessment & Plan:    Patient Active Problem List   Diagnosis Date Noted   Therapeutic drug monitoring 09/05/2021   Precordial chest pain 07/19/2021   Elevated blood pressure reading 07/19/2021   Bronchitis 06/12/2021   Acute cough 06/12/2021   Prostate cancer ([place]) 12/04/2020   Malignant neoplasm of prostate ([place]) 10/18/2020   History of diverticulitis 08/29/2020   Elevated PSA 04/20/2020   High risk homosexual behavior 09/23/2018   Groin  discomfort 09/21/2018   On pre-exposure prophylaxis for HIV 12/17/2017   Chronic left shoulder pain 08/13/2017   Nocturia 08/13/2017   Exposure to HIV 06/06/2017   Erectile dysfunction 06/28/2015   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  Check HIV and STD testing today.  Discussed importance of safe sexual practices and condom use.  Condoms offered.  Continue current dose of Descovy.  Plan for follow-up in 3 months or sooner if needed with lab work on the same day.      Relevant Orders   HIV Antibody (routine testing w rflx)   Therapeutic drug monitoring    Renal and hepatic function stable with current dose of Descovy.  Continue to monitor.      Other Visit Diagnoses     Screening for STDs (sexually transmitted diseases)       Relevant Orders   Urine cytology ancillary only   RPR        I am having James Nay "Richardson Landry" maintain his acetaminophen, esomeprazole, docusate sodium, tobramycin-dexamethasone, metoprolol tartrate, sildenafil, rosuvastatin, benzonatate, doxycycline, and emtricitabine-tenofovir AF.   Meds ordered this encounter  Medications   emtricitabine-tenofovir AF (DESCOVY) 200-25 MG tablet    Sig: TAKE 1 TABLET BY MOUTH DAILY.    Dispense:  30 tablet    Refill:  3    Order Specific Question:   Supervising Provider    Answer:   Carlyle Basques [[id number]]     Follow-up: Return in about 3 months (around 12/06/2021).   Terri Piedra, MSN, FNP-C Nurse Practitioner [facility] [place] number: [phone]

## 2021-09-05 NOTE — Assessment & Plan Note (Signed)
Renal and hepatic function stable with current dose of Descovy.  Continue to monitor.

## 2021-09-05 NOTE — Patient Instructions (Signed)
Nice to see you.  We will check your lab work today.  Continue to take your medication daily as prescribed.  Refills have been sent to the pharmacy.  Plan for follow up in 3 months or sooner if needed with lab work on the same day.  Have a great day and stay safe!  

## 2021-09-05 NOTE — Assessment & Plan Note (Signed)
Mr. Strawderman continues to do well on preexposure prophylaxis for HIV with Descovy.  Check HIV and STD testing today.  Discussed importance of safe sexual practices and condom use.  Condoms offered.  Continue current dose of Descovy.  Plan for follow-up in 3 months or sooner if needed with lab work on the same day.

## 2021-09-06 LAB — RPR: RPR Ser Ql: NONREACTIVE

## 2021-09-06 LAB — HIV ANTIBODY (ROUTINE TESTING W REFLEX): HIV 1&2 Ab, 4th Generation: NONREACTIVE

## 2021-09-06 LAB — URINE CYTOLOGY ANCILLARY ONLY
Chlamydia: NEGATIVE
Comment: NEGATIVE
Comment: NORMAL
Neisseria Gonorrhea: NEGATIVE

## 2021-09-12 DIAGNOSIS — C61 Malignant neoplasm of prostate: Secondary | ICD-10-CM | POA: Diagnosis not present

## 2021-09-12 DIAGNOSIS — N5201 Erectile dysfunction due to arterial insufficiency: Secondary | ICD-10-CM | POA: Diagnosis not present

## 2021-09-12 DIAGNOSIS — M6281 Muscle weakness (generalized): Secondary | ICD-10-CM | POA: Diagnosis not present

## 2021-09-12 DIAGNOSIS — M62838 Other muscle spasm: Secondary | ICD-10-CM | POA: Diagnosis not present

## 2021-09-12 DIAGNOSIS — N393 Stress incontinence (female) (male): Secondary | ICD-10-CM | POA: Diagnosis not present

## 2021-09-14 ENCOUNTER — Other Ambulatory Visit (HOSPITAL_COMMUNITY): Payer: Self-pay

## 2021-09-17 ENCOUNTER — Other Ambulatory Visit (HOSPITAL_COMMUNITY): Payer: Self-pay

## 2021-09-20 ENCOUNTER — Encounter: Payer: Self-pay | Admitting: Primary Care

## 2021-09-20 ENCOUNTER — Ambulatory Visit (INDEPENDENT_AMBULATORY_CARE_PROVIDER_SITE_OTHER): Payer: 59 | Admitting: Primary Care

## 2021-09-20 ENCOUNTER — Other Ambulatory Visit: Payer: Self-pay | Admitting: Primary Care

## 2021-09-20 VITALS — BP 118/76 | HR 89 | Temp 98.6°F | Ht 67.0 in | Wt 155.0 lb

## 2021-09-20 DIAGNOSIS — Z8546 Personal history of malignant neoplasm of prostate: Secondary | ICD-10-CM

## 2021-09-20 DIAGNOSIS — Z79899 Other long term (current) drug therapy: Secondary | ICD-10-CM | POA: Diagnosis not present

## 2021-09-20 DIAGNOSIS — R351 Nocturia: Secondary | ICD-10-CM | POA: Diagnosis not present

## 2021-09-20 DIAGNOSIS — I10 Essential (primary) hypertension: Secondary | ICD-10-CM | POA: Diagnosis not present

## 2021-09-20 DIAGNOSIS — Z0000 Encounter for general adult medical examination without abnormal findings: Secondary | ICD-10-CM | POA: Diagnosis not present

## 2021-09-20 DIAGNOSIS — K219 Gastro-esophageal reflux disease without esophagitis: Secondary | ICD-10-CM | POA: Diagnosis not present

## 2021-09-20 DIAGNOSIS — N529 Male erectile dysfunction, unspecified: Secondary | ICD-10-CM

## 2021-09-20 DIAGNOSIS — E7849 Other hyperlipidemia: Secondary | ICD-10-CM | POA: Diagnosis not present

## 2021-09-20 DIAGNOSIS — I2510 Atherosclerotic heart disease of native coronary artery without angina pectoris: Secondary | ICD-10-CM | POA: Diagnosis not present

## 2021-09-20 NOTE — Assessment & Plan Note (Signed)
Following with infectious disease.   Reviewed labs from June 2023.  Continue Descovy Descovy 200-25 mg daily.

## 2021-09-20 NOTE — Assessment & Plan Note (Signed)
Improved. No longer on tamsulosin as he has S/P prostatectomy.  Following with urology

## 2021-09-20 NOTE — Assessment & Plan Note (Signed)
Recent diagnosis. Reviewed CT coronary scan from June 2023.  Continue rosuvastatin 10 mg daily. Following with cardiology

## 2021-09-20 NOTE — Patient Instructions (Signed)
It was a pleasure to see you today!  Preventive Care 28 Years and Older, Male Preventive care refers to lifestyle choices and visits with your health care provider that can promote health and wellness. Preventive care visits are also called wellness exams. What can I expect for my preventive care visit? Counseling During your preventive care visit, your health care provider may ask about your: Medical history, including: Past medical problems. Family medical history. History of falls. Current health, including: Emotional well-being. Home life and relationship well-being. Sexual activity. Memory and ability to understand (cognition). Lifestyle, including: Alcohol, nicotine or tobacco, and drug use. Access to firearms. Diet, exercise, and sleep habits. Work and work Statistician. Sunscreen use. Safety issues such as seatbelt and bike helmet use. Physical exam Your health care provider will check your: Height and weight. These may be used to calculate your BMI (body mass index). BMI is a measurement that tells if you are at a healthy weight. Waist circumference. This measures the distance around your waistline. This measurement also tells if you are at a healthy weight and may help predict your risk of certain diseases, such as type 2 diabetes and high blood pressure. Heart rate and blood pressure. Body temperature. Skin for abnormal spots. What immunizations do I need?  Vaccines are usually given at various ages, according to a schedule. Your health care provider will recommend vaccines for you based on your age, medical history, and lifestyle or other factors, such as travel or where you work. What tests do I need? Screening Your health care provider may recommend screening tests for certain conditions. This may include: Lipid and cholesterol levels. Diabetes screening. This is done by checking your blood sugar (glucose) after you have not eaten for a while (fasting). Hepatitis C  test. Hepatitis B test. HIV (human immunodeficiency virus) test. STI (sexually transmitted infection) testing, if you are at risk. Lung cancer screening. Colorectal cancer screening. Prostate cancer screening. Abdominal aortic aneurysm (AAA) screening. You may need this if you are a current or former smoker. Talk with your health care provider about your test results, treatment options, and if necessary, the need for more tests. Follow these instructions at home: Eating and drinking  Eat a diet that includes fresh fruits and vegetables, whole grains, lean protein, and low-fat dairy products. Limit your intake of foods with high amounts of sugar, saturated fats, and salt. Take vitamin and mineral supplements as recommended by your health care provider. Do not drink alcohol if your health care provider tells you not to drink. If you drink alcohol: Limit how much you have to 0-2 drinks a day. Know how much alcohol is in your drink. In the [place] one drink equals one 12 oz bottle of beer (355 mL), one 5 oz glass of wine (148 mL), or one 1 oz glass of hard liquor (44 mL). Lifestyle Brush your teeth every morning and night with fluoride toothpaste. Floss one time each day. Exercise for at least 30 minutes 5 or more days each week. Do not use any products that contain nicotine or tobacco. These products include cigarettes, chewing tobacco, and vaping devices, such as e-cigarettes. If you need help quitting, ask your health care provider. Do not use drugs. If you are sexually active, practice safe sex. Use a condom or other form of protection to prevent STIs. Take aspirin only as told by your health care provider. Make sure that you understand how much to take and what form to take. Work with your health  care provider to find out whether it is safe and beneficial for you to take aspirin daily. Ask your health care provider if you need to take a cholesterol-lowering medicine (statin). Find healthy  ways to manage stress, such as: Meditation, yoga, or listening to music. Journaling. Talking to a trusted person. Spending time with friends and family. Safety Always wear your seat belt while driving or riding in a vehicle. Do not drive: If you have been drinking alcohol. Do not ride with someone who has been drinking. When you are tired or distracted. While texting. If you have been using any mind-altering substances or drugs. Wear a helmet and other protective equipment during sports activities. If you have firearms in your house, make sure you follow all gun safety procedures. Minimize exposure to UV radiation to reduce your risk of skin cancer. What's next? Visit your health care provider once a year for an annual wellness visit. Ask your health care provider how often you should have your eyes and teeth checked. Stay up to date on all vaccines. This information is not intended to replace advice given to you by your health care provider. Make sure you discuss any questions you have with your health care provider. Document Revised: 08/30/2020 Document Reviewed: 08/30/2020 Elsevier Patient Education  [place].

## 2021-09-20 NOTE — Assessment & Plan Note (Signed)
Controlled. ?Continue Nexium 40 mg daily. ?

## 2021-09-20 NOTE — Assessment & Plan Note (Signed)
Continue Crestor 10 mg. Repeat lipid panel pending.

## 2021-09-20 NOTE — Assessment & Plan Note (Signed)
Controlled.  Continue to monitor.  Continue off medications.

## 2021-09-20 NOTE — Assessment & Plan Note (Addendum)
Following with Urology   He is S/P robotic assisted laparoscopic radical prostatectomy from September 2022.  Doing well.  Recent PSA undetectable.  Continue physical therapy.

## 2021-09-20 NOTE — Progress Notes (Signed)
Subjective:    Patient ID: James Barry, male    DOB: January 28, 1956, 66 y.o.   MRN: [id number]  HPI  James Barry is a very pleasant 66 y.o. male who presents today for complete physical and follow up of chronic conditions.  Immunizations: -Tetanus: 2021 -Influenza: Completed last season  -Covid-19: 5 vaccines -Shingles: Completed Shingrix series -Pneumonia: Prevnar 06 March 2021  Diet: [place].  Exercise: No regular exercise.  Eye exam: Completes annually  Dental exam: Completes semi-annually   Colonoscopy: Completed in 2019, due 2024 PSA: UTD  BP Readings from Last 3 Encounters:  09/20/21 118/76  09/05/21 (!) 143/88  08/27/21 130/90         Review of Systems  Constitutional:  Negative for unexpected weight change.  HENT:  Negative for rhinorrhea.   Respiratory:  Negative for cough and shortness of breath.   Cardiovascular:  Negative for chest pain.  Gastrointestinal:  Negative for constipation and diarrhea.  Genitourinary:  Negative for difficulty urinating.  Musculoskeletal:  Negative for arthralgias and myalgias.  Skin:  Negative for rash.  Allergic/Immunologic: Negative for environmental allergies.  Neurological:  Negative for dizziness and headaches.  Psychiatric/Behavioral:  The patient is not nervous/anxious.          Past Medical History:  Diagnosis Date   Acute neck pain 08/19/2019   Acute thoracic back pain 07/30/2019   Arthritis    Cancer [facility])    prostate   Clavicular asymmetry 07/30/2019   Diverticulosis    Erectile dysfunction    GERD (gastroesophageal reflux disease)    Hyperlipidemia    Vertigo     Social History   Socioeconomic History   Marital status: Married    Spouse name: Not on file   Number of children: Not on file   Years of education: Not on file   Highest education level: Not on file  Occupational History   Not on file  Tobacco Use   Smoking status: Never   Smokeless tobacco: Never  Vaping Use    Vaping Use: Never used  Substance and Sexual Activity   Alcohol use: Yes    Alcohol/week: 0.0 standard drinks of alcohol    Comment: social   Drug use: No   Sexual activity: Yes    Partners: Male    Comment: offered condoms  Other Topics Concern   Not on file  Social History Narrative   Married.   No children.   Retired.   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   Social Determinants of Health   Financial Resource Strain: Not on file  Food Insecurity: Not on file  Transportation Needs: Not on file  Physical Activity: Not on file  Stress: Not on file  Social Connections: Not on file  Intimate Partner Violence: Not on file    Past Surgical History:  Procedure Laterality Date   COLONOSCOPY  03/19/2011   cleared for 5 yrs- Dr @ [facility])   [place] Bilateral 12/04/2020   Procedure: LYMPHADENECTOMY, PELVIC;  Surgeon: Raynelle Bring, MD;  Location: [facility];  Service: Urology;  Laterality: Bilateral;   ROBOT ASSISTED LAPAROSCOPIC RADICAL PROSTATECTOMY N/A 12/04/2020   Procedure: XI ROBOTIC ASSISTED LAPAROSCOPIC RADICAL PROSTATECTOMY LEVEL 2;  Surgeon: Raynelle Bring, MD;  Location: [facility];  Service: Urology;  Laterality: N/A;    Family History  Problem Relation Age of Onset   Breast cancer Mother    Lung cancer  Father    Heart disease Father    Hypertension Father    Arthritis Maternal Grandmother     Allergies  Allergen Reactions   Buspirone     Unaware    Cephalexin     Unaware     Current Outpatient Medications on File Prior to Visit  Medication Sig Dispense Refill   acetaminophen (TYLENOL) 650 MG CR tablet Take 1,300 mg by mouth every morning.     docusate sodium (COLACE) 100 MG capsule Take 1 capsule (100 mg total) by mouth 2 (two) times daily.     emtricitabine-tenofovir AF (DESCOVY) 200-25 MG tablet TAKE 1 TABLET BY MOUTH DAILY. 30 tablet 3   esomeprazole (NEXIUM) 40 MG capsule Take  1 capsule  by mouth daily for heartburn. 90 capsule 3   metoprolol tartrate (LOPRESSOR) 50 MG tablet Take one tablet (2) hours before your CT scan as directed. 1 tablet 0   rosuvastatin (CRESTOR) 10 MG tablet Take 1 tablet (10 mg total) by mouth daily. 90 tablet 3   sildenafil (VIAGRA) 100 MG tablet Take 1 tablet by mouth 30-60 minutes prior to intercourse as needed. 90 tablet 0   tobramycin-dexamethasone (TOBRADEX) ophthalmic solution Instill 1 drop into both eyes four times a day (Patient not taking: Reported on 09/20/2021) 5 mL 0   No current facility-administered medications on file prior to visit.    BP 118/76   Pulse 89   Temp 98.6 F (37 C) (Oral)   Ht '5\' 7"'$  (1.702 m)   Wt 155 lb (70.3 kg)   SpO2 97%   BMI 24.28 kg/m  Objective:   Physical Exam HENT:     Right Ear: Tympanic membrane and ear canal normal.     Left Ear: Tympanic membrane and ear canal normal.     Nose: Nose normal.     Right Sinus: No maxillary sinus tenderness or frontal sinus tenderness.     Left Sinus: No maxillary sinus tenderness or frontal sinus tenderness.  Eyes:     Conjunctiva/sclera: Conjunctivae normal.  Neck:     Thyroid: No thyromegaly.     Vascular: No carotid bruit.  Cardiovascular:     Rate and Rhythm: Normal rate and regular rhythm.     Heart sounds: Normal heart sounds.  Pulmonary:     Effort: Pulmonary effort is normal.     Breath sounds: Normal breath sounds. No wheezing or rales.  Abdominal:     General: Bowel sounds are normal.     Palpations: Abdomen is soft.     Tenderness: There is no abdominal tenderness.  Musculoskeletal:        General: Normal range of motion.     Cervical back: Neck supple.  Skin:    General: Skin is warm and dry.  Neurological:     Mental Status: He is alert and oriented to person, place, and time.     Cranial Nerves: No cranial nerve deficit.     Deep Tendon Reflexes: Reflexes are normal and symmetric.  Psychiatric:        Mood and Affect: Mood  normal.           Assessment & Plan:   Problem List Items Addressed This Visit       Cardiovascular and Mediastinum   Essential (primary) hypertension    Controlled.  Continue to monitor.  Continue off medications.       CAD (coronary artery disease)    Recent diagnosis. Reviewed CT coronary scan from June 2023.  Continue rosuvastatin 10 mg daily. Following with cardiology         Digestive   Acid reflux    Controlled.  Continue Nexium 40 mg daily.         Other   Familial multiple lipoprotein-type hyperlipidemia    Continue Crestor 10 mg. Repeat lipid panel pending.      Erectile dysfunction    Overall stable and returning since prostate cancer surgery.  Continue Viagra 100 mg daily. Following with urology.       Nocturia    Improved. No longer on tamsulosin as he has S/P prostatectomy.  Following with urology      On pre-exposure prophylaxis for HIV    Following with infectious disease.   Reviewed labs from June 2023.  Continue Descovy Descovy 200-25 mg daily.      Preventative health care    Immunizations UTD. Colonoscopy UTD, due 2024 PSA UTD, follows with Urology  Discussed the importance of a healthy diet and regular exercise in order for weight loss, and to reduce the risk of further co-morbidity.  Exam stable. Labs reviewed  Follow up in 1 year for repeat physical.       History of prostate cancer    Following with Urology   He is S/P robotic assisted laparoscopic radical prostatectomy from September 2022.  Doing well.  Recent PSA undetectable.  Continue physical therapy.           Pleas Koch, NP

## 2021-09-20 NOTE — Assessment & Plan Note (Signed)
Overall stable and returning since prostate cancer surgery.  Continue Viagra 100 mg daily. Following with urology.

## 2021-09-20 NOTE — Assessment & Plan Note (Signed)
Immunizations UTD. Colonoscopy UTD, due 2024 PSA UTD, follows with Urology  Discussed the importance of a healthy diet and regular exercise in order for weight loss, and to reduce the risk of further co-morbidity.  Exam stable. Labs reviewed  Follow up in 1 year for repeat physical.

## 2021-09-21 ENCOUNTER — Other Ambulatory Visit (HOSPITAL_COMMUNITY): Payer: Self-pay

## 2021-09-21 MED ORDER — ESOMEPRAZOLE MAGNESIUM 40 MG PO CPDR
40.0000 mg | DELAYED_RELEASE_CAPSULE | Freq: Every day | ORAL | 3 refills | Status: DC
  Filled 2021-10-01: qty 90, 90d supply, fill #0
  Filled 2021-12-28: qty 90, 90d supply, fill #1
  Filled 2022-03-28: qty 90, 90d supply, fill #2
  Filled 2022-06-17: qty 90, 90d supply, fill #3

## 2021-10-01 ENCOUNTER — Other Ambulatory Visit (HOSPITAL_COMMUNITY): Payer: Self-pay

## 2021-10-02 ENCOUNTER — Other Ambulatory Visit (HOSPITAL_COMMUNITY): Payer: Self-pay

## 2021-10-09 DIAGNOSIS — Q828 Other specified congenital malformations of skin: Secondary | ICD-10-CM | POA: Diagnosis not present

## 2021-10-09 DIAGNOSIS — L905 Scar conditions and fibrosis of skin: Secondary | ICD-10-CM | POA: Diagnosis not present

## 2021-10-09 DIAGNOSIS — L821 Other seborrheic keratosis: Secondary | ICD-10-CM | POA: Diagnosis not present

## 2021-10-16 ENCOUNTER — Other Ambulatory Visit (HOSPITAL_COMMUNITY): Payer: Self-pay

## 2021-11-01 ENCOUNTER — Other Ambulatory Visit (HOSPITAL_COMMUNITY): Payer: Self-pay

## 2021-11-01 ENCOUNTER — Other Ambulatory Visit: Payer: Self-pay | Admitting: Primary Care

## 2021-11-01 DIAGNOSIS — N529 Male erectile dysfunction, unspecified: Secondary | ICD-10-CM

## 2021-11-01 MED ORDER — SILDENAFIL CITRATE 100 MG PO TABS
ORAL_TABLET | ORAL | 0 refills | Status: DC
  Filled 2021-11-01: qty 90, 90d supply, fill #0

## 2021-11-02 ENCOUNTER — Other Ambulatory Visit: Payer: 59

## 2021-11-02 ENCOUNTER — Encounter: Payer: Self-pay | Admitting: Cardiology

## 2021-11-02 ENCOUNTER — Ambulatory Visit: Payer: 59 | Admitting: Cardiology

## 2021-11-02 ENCOUNTER — Other Ambulatory Visit (HOSPITAL_COMMUNITY): Payer: Self-pay

## 2021-11-02 VITALS — BP 120/80 | HR 72 | Ht 67.0 in | Wt 156.0 lb

## 2021-11-02 DIAGNOSIS — M6281 Muscle weakness (generalized): Secondary | ICD-10-CM | POA: Diagnosis not present

## 2021-11-02 DIAGNOSIS — R072 Precordial pain: Secondary | ICD-10-CM

## 2021-11-02 DIAGNOSIS — I2510 Atherosclerotic heart disease of native coronary artery without angina pectoris: Secondary | ICD-10-CM | POA: Diagnosis not present

## 2021-11-02 DIAGNOSIS — R931 Abnormal findings on diagnostic imaging of heart and coronary circulation: Secondary | ICD-10-CM | POA: Diagnosis not present

## 2021-11-02 DIAGNOSIS — N393 Stress incontinence (female) (male): Secondary | ICD-10-CM | POA: Diagnosis not present

## 2021-11-02 DIAGNOSIS — M62838 Other muscle spasm: Secondary | ICD-10-CM | POA: Diagnosis not present

## 2021-11-02 LAB — LIPID PANEL
Chol/HDL Ratio: 2.6 ratio (ref 0.0–5.0)
Cholesterol, Total: 126 mg/dL (ref 100–199)
HDL: 49 mg/dL (ref >39)
LDL Chol Calc (NIH): 58 mg/dL (ref 0–99)
Triglycerides: 105 mg/dL (ref 0–149)
VLDL Cholesterol Cal: 19 mg/dL (ref 5–40)

## 2021-11-02 LAB — ALT: ALT: 24 IU/L (ref 0–44)

## 2021-11-02 MED ORDER — ASPIRIN 81 MG PO TBEC: 81.0000 mg | DELAYED_RELEASE_TABLET | Freq: Every day | ORAL | 3 refills | Status: AC

## 2021-11-02 NOTE — Progress Notes (Signed)
Cardiology Office Note:    Date:  11/02/2021   ID:  James Barry, DOB Aug 10, 1955, MRN [id number]  PCP:  Pleas Koch, NP   [facility] Providers Cardiologist:  None     Referring MD: Pleas Koch, NP    History of Present Illness:    James Barry is a 66 y.o. male here for the follow up for discussion of coronary CT results.   He had a Coronary CT on 08/23/2021 which revealed a coronary calcium score of 461, mild (25-49) CAD in the proximal LAD and LCx. Aortic atherosclerosis was also noted. Crestor 10 mg was started with lipid panel in 2 months.   Previously here for the evaluation of chest discomfort.  He was here with his spouse, James Barry who was evaluated as well for chest discomfort.  During that visit, he mentioned having similar symptoms.  Had this over the past few years. In the past he has had a stress test which was unremarkable.  Personally reviewed the findings.  Exercised for 6 minutes and 51 seconds without any significant ST changes.  This was in 2020.  Came back recently but chest pain left down left arm. No sweats. No jaw pain. Lasts 10-15 minutes.  Noticed it during stress.  Does [place].   No smoking.   Father had CAD, stents. Mother died Breat CA 60  Saw some plaque on heart previously (? Valve).    Today:  He is accompanied by a family member. He appears to be doing well. The results of his Coronary CT were discussed, as well as the Crestor he was started on.   He states that he has been feeling slight pain in his chest and arm while he has been in the clinic. His primarily discomfort is on the left side of his chest which radiates to his left arm. He states it "comes and goes" and after he feels it, it will typically recede for about 3 days. He endorses stress as a factor to feeling more of the discomfort. He denies any other associated symptoms.  He has been cancer free after prostate surgery for almost a year now.    He took a  CPR course this past week in his neighborhood clubhouse. He states he really enjoyed it despite it being somewhat scary for him with his heart condition.   He denies any palpitations, shortness of breath, or peripheral edema. No lightheadedness, headaches, syncope, orthopnea, or PND.    Past Medical History:  Diagnosis Date   Acute neck pain 08/19/2019   Acute thoracic back pain 07/30/2019   Arthritis    Cancer ([place])    prostate   Clavicular asymmetry 07/30/2019   Diverticulosis    Erectile dysfunction    GERD (gastroesophageal reflux disease)    Hyperlipidemia    Vertigo     Past Surgical History:  Procedure Laterality Date   COLONOSCOPY  03/19/2011   cleared for 5 yrs- Dr @ [facility])   [place] Bilateral 12/04/2020   Procedure: LYMPHADENECTOMY, PELVIC;  Surgeon: Raynelle Bring, MD;  [facility];  Service: Urology;  Laterality: Bilateral;   ROBOT ASSISTED LAPAROSCOPIC RADICAL PROSTATECTOMY N/A 12/04/2020   Procedure: XI ROBOTIC ASSISTED LAPAROSCOPIC RADICAL PROSTATECTOMY LEVEL 2;  Surgeon: Raynelle Bring, MD;  Location: [facility];  Service: Urology;  Laterality: N/A;    Current Medications: Current Meds  Medication Sig   acetaminophen (TYLENOL) 650 MG CR tablet Take 1,300  mg by mouth every morning.   aspirin EC 81 MG tablet Take 1 tablet (81 mg total) by mouth daily. Swallow whole.   docusate sodium (COLACE) 100 MG capsule Take 1 capsule (100 mg total) by mouth 2 (two) times daily.   emtricitabine-tenofovir AF (DESCOVY) 200-25 MG tablet TAKE 1 TABLET BY MOUTH DAILY.   esomeprazole (NEXIUM) 40 MG capsule Take 1 capsule  by mouth daily for heartburn.   metoprolol tartrate (LOPRESSOR) 50 MG tablet Take one tablet (2) hours before your CT scan as directed.   rosuvastatin (CRESTOR) 10 MG tablet Take 1 tablet (10 mg total) by mouth daily.   sildenafil (VIAGRA) 100 MG tablet Take 1 tablet by mouth 30-60 minutes prior to sexual activity as  needed.   tobramycin-dexamethasone (TOBRADEX) ophthalmic solution Instill 1 drop into both eyes four times a day     Allergies:   Buspirone and Cephalexin   Social History   Socioeconomic History   Marital status: Married    Spouse name: Not on file   Number of children: Not on file   Years of education: Not on file   Highest education level: Not on file  Occupational History   Not on file  Tobacco Use   Smoking status: Never   Smokeless tobacco: Never  Vaping Use   Vaping Use: Never used  Substance and Sexual Activity   Alcohol use: Yes    Alcohol/week: 0.0 standard drinks of alcohol    Comment: social   Drug use: No   Sexual activity: Yes    Partners: Male    Comment: offered condoms  Other Topics Concern   Not on file  Social History Narrative   Married.   No children.   Retired.   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   Social Determinants of Health   Financial Resource Strain: Not on file  Food Insecurity: Not on file  Transportation Needs: Not on file  Physical Activity: Not on file  Stress: Not on file  Social Connections: Not on file     Family History: The patient's family history includes Arthritis in his maternal grandmother; Breast cancer in his mother; Heart disease in his father; Hypertension in his father; Lung cancer in his father.  ROS:   Please see the history of present illness. (+) Left chest pain (+) LUE pain  All other systems reviewed and are negative.  EKGs/Labs/Other Studies Reviewed:    The following studies were reviewed today:  CT Coronary Morph 08/23/2021: IMPRESSION: 1. Coronary calcium score of 461. This was 37 percentile for age-, sex, and race-matched controls.   2. Normal coronary origin with left dominance.   3. Mild (25-49) CAD in the proximal LAD and LCx.   4. Aortic atherosclerosis.   5. Suboptimal study due to significant misregistration.   RECOMMENDATIONS: CAD-RADS 2:  Mild non-obstructive CAD (25-49%). Consider non-atherosclerotic causes of chest pain. Consider preventive therapy and risk factor modification.     EKG:  EKG is personally reviewed.  11/02/21: EKG was not ordered.   07/19/21: sinus rhythm 66 no other abnormalities  Recent Labs: 11/27/2020: Platelets 237 12/05/2020: Hemoglobin 10.8 02/21/2021: ALT 17 07/19/2021: BUN 14; Creatinine, Ser 0.97; Potassium 4.5; Sodium 138  Recent Lipid Panel    Component Value Date/Time   CHOL 175 08/29/2020 0824   CHOL 186 06/28/2015 0950   TRIG 182.0 (H) 08/29/2020 0824   HDL 42.90 08/29/2020 0824   HDL 51 06/28/2015 0950   CHOLHDL  4 08/29/2020 0824   VLDL 36.4 08/29/2020 0824   LDLCALC 96 08/29/2020 0824   LDLCALC 119 (H) 06/28/2015 0950     Risk Assessment/Calculations:              Physical Exam:    VS:  BP 120/80 (BP Location: Left Arm, Patient Position: Sitting, Cuff Size: Normal)   Pulse 72   Ht '5\' 7"'$  (1.702 m)   Wt 156 lb (70.8 kg)   SpO2 97%   BMI 24.43 kg/m     Wt Readings from Last 3 Encounters:  11/02/21 156 lb (70.8 kg)  09/20/21 155 lb (70.3 kg)  09/05/21 158 lb 6.4 oz (71.8 kg)     GEN:  Well nourished, well developed in no acute distress HEENT: Normal NECK: No JVD; No carotid bruits LYMPHATICS: No lymphadenopathy CARDIAC: RRR, no murmurs, no rubs, gallops RESPIRATORY:  Clear to auscultation without rales, wheezing or rhonchi  ABDOMEN: Soft, non-tender, non-distended MUSCULOSKELETAL:  No edema; No deformity  SKIN: Warm and dry NEUROLOGIC:  Alert and oriented x 3 PSYCHIATRIC:  Normal affect   ASSESSMENT:    1. CAD in native artery   2. Precordial pain     PLAN:    In order of problems listed above:  Precordial chest pain Left-sided chest discomfort radiating down his left arm during periods of stress.  His father had coronary disease with stent placement.  Overall mild stenosis noted with elevated coronary calcium score as above.  Continue with goal-directed  medical therapy.  Now on Crestor 10 mg.  Checking lipid panel today.  Goal LDL less than 70.   Elevated blood pressure reading Does admit to some whitecoat hypertension, situational high blood pressure.  Continue to monitor at home.  May need antihypertensive in the future.  Overall excellent today.  Continue with exercise.    Follow up: 1 year  Medication Adjustments/Labs and Tests Ordered: Current medicines are reviewed at length with the patient today.  Concerns regarding medicines are outlined above.  No orders of the defined types were placed in this encounter.  Meds ordered this encounter  Medications   aspirin EC 81 MG tablet    Sig: Take 1 tablet (81 mg total) by mouth daily. Swallow whole.    Dispense:  90 tablet    Refill:  3    Patient Instructions  Medication Instructions:  Please start Aspirin 81 mg once daily. Continue all other medications as listed.  *If you need a refill on your cardiac medications before your next appointment, please call your pharmacy*  Lab Work: Please have blood work as previously ordered (Lipid)  If you have labs (blood work) drawn today and your tests are completely normal, you will receive your results only by: MyChart Message (if you have MyChart) OR A paper copy in the mail If you have any lab test that is abnormal or we need to change your treatment, we will call you to review the results.  Follow-Up: At [facility], you and your health needs are our priority.  As part of our continuing mission to provide you with exceptional heart care, we have created designated Provider Care Teams.  These Care Teams include your primary Cardiologist (physician) and Advanced Practice Providers (APPs -  Physician Assistants and Nurse Practitioners) who all work together to provide you with the care you need, when you need it.  We recommend signing up for the patient portal called "MyChart".  Sign up information is provided on this After Visit  Summary.  MyChart is used to connect with patients for Virtual Visits (Telemedicine).  Patients are able to view lab/test results, encounter notes, upcoming appointments, etc.  Non-urgent messages can be sent to your provider as well.   To learn more about what you can do with MyChart, go to [email or web address].    Your next appointment:   1 year(s)  The format for your next appointment:   In Person  Provider:   Dr Candee Furbish   Important Information About Sugar         I,Breanna Adamick,acting as a scribe for Candee Furbish, MD.,have documented all relevant documentation on the behalf of Candee Furbish, MD,as directed by  Candee Furbish, MD while in the presence of Candee Furbish, MD.   I, Candee Furbish, MD, have reviewed all documentation for this visit. The documentation on 11/02/21 for the exam, diagnosis, procedures, and orders are all accurate and complete.   Signed, Candee Furbish, MD  11/02/2021 9:29 AM    [facility]

## 2021-11-02 NOTE — Patient Instructions (Signed)
Medication Instructions:  Please start Aspirin 81 mg once daily. Continue all other medications as listed.  *If you need a refill on your cardiac medications before your next appointment, please call your pharmacy*  Lab Work: Please have blood work as previously ordered (Lipid)  If you have labs (blood work) drawn today and your tests are completely normal, you will receive your results only by: MyChart Message (if you have MyChart) OR A paper copy in the mail If you have any lab test that is abnormal or we need to change your treatment, we will call you to review the results.  Follow-Up: At [facility], you and your health needs are our priority.  As part of our continuing mission to provide you with exceptional heart care, we have created designated Provider Care Teams.  These Care Teams include your primary Cardiologist (physician) and Advanced Practice Providers (APPs -  Physician Assistants and Nurse Practitioners) who all work together to provide you with the care you need, when you need it.  We recommend signing up for the patient portal called "MyChart".  Sign up information is provided on this After Visit Summary.  MyChart is used to connect with patients for Virtual Visits (Telemedicine).  Patients are able to view lab/test results, encounter notes, upcoming appointments, etc.  Non-urgent messages can be sent to your provider as well.   To learn more about what you can do with MyChart, go to [email or web address].    Your next appointment:   1 year(s)  The format for your next appointment:   In Person  Provider:   Dr Candee Furbish   Important Information About Sugar

## 2021-11-07 ENCOUNTER — Other Ambulatory Visit (HOSPITAL_COMMUNITY): Payer: Self-pay

## 2021-11-09 ENCOUNTER — Other Ambulatory Visit (HOSPITAL_COMMUNITY): Payer: Self-pay

## 2021-11-12 ENCOUNTER — Other Ambulatory Visit (HOSPITAL_COMMUNITY): Payer: Self-pay

## 2021-11-14 ENCOUNTER — Other Ambulatory Visit (HOSPITAL_COMMUNITY): Payer: Self-pay

## 2021-12-04 ENCOUNTER — Telehealth: Payer: Self-pay

## 2021-12-04 NOTE — Telephone Encounter (Addendum)
-----   Message from Yetta Flock, MD sent at 12/04/2021  7:50 AM EDT ----- Regarding: colon to be scheduled at the [facility], This patient asked me to perform his colonoscopy. I reviewed his last exam at [facility] - they recommended a repeat in 04/2022. Can you just let him know that we got the request. He can call back in December to schedule, and can you place a recall for him in our system for it? Thanks  Dr. Loni Muse

## 2021-12-04 NOTE — Telephone Encounter (Signed)
Called and spoke with patient's partner, Nochum. I informed him that patient will be due for recall colonoscopy in 04/2022. I told Enzo that we will place a recall and patient will receive a letter when it is time for him to schedule. Yoel verbalized understanding and had no concerns at the end of the call.  04/2022 colonoscopy recall in epic.

## 2021-12-06 ENCOUNTER — Other Ambulatory Visit (HOSPITAL_COMMUNITY): Payer: Self-pay

## 2021-12-12 ENCOUNTER — Other Ambulatory Visit (HOSPITAL_COMMUNITY): Payer: Self-pay

## 2021-12-13 ENCOUNTER — Other Ambulatory Visit (HOSPITAL_COMMUNITY): Payer: Self-pay

## 2021-12-18 NOTE — Progress Notes (Unsigned)
Subjective:    Patient ID: James Barry, male    DOB: June 21, 1955, 66 y.o.   MRN: [id number]  No chief complaint on file.   HPI:  James Barry is a 66 y.o. male on pre-exposure prophylaxis for HIV last seen on 09/05/21 with good adherence and tolerance to Descovy. STD and HIV testing were negative. Here today for routine follow up.    Allergies  Allergen Reactions   Buspirone     Unaware    Cephalexin     Unaware       Outpatient Medications Prior to Visit  Medication Sig Dispense Refill   acetaminophen (TYLENOL) 650 MG CR tablet Take 1,300 mg by mouth every morning.     aspirin EC 81 MG tablet Take 1 tablet (81 mg total) by mouth daily. Swallow whole. 90 tablet 3   docusate sodium (COLACE) 100 MG capsule Take 1 capsule (100 mg total) by mouth 2 (two) times daily.     emtricitabine-tenofovir AF (DESCOVY) 200-25 MG tablet TAKE 1 TABLET BY MOUTH DAILY. 30 tablet 3   esomeprazole (NEXIUM) 40 MG capsule Take 1 capsule  by mouth daily for heartburn. 90 capsule 3   metoprolol tartrate (LOPRESSOR) 50 MG tablet Take one tablet (2) hours before your CT scan as directed. 1 tablet 0   rosuvastatin (CRESTOR) 10 MG tablet Take 1 tablet (10 mg total) by mouth daily. 90 tablet 3   sildenafil (VIAGRA) 100 MG tablet Take 1 tablet by mouth 30-60 minutes prior to sexual activity as needed. 90 tablet 0   tobramycin-dexamethasone (TOBRADEX) ophthalmic solution Instill 1 drop into both eyes four times a day 5 mL 0   No facility-administered medications prior to visit.     Past Medical History:  Diagnosis Date   Acute neck pain 08/19/2019   Acute thoracic back pain 07/30/2019   Arthritis    Cancer ([place])    prostate   Clavicular asymmetry 07/30/2019   Diverticulosis    Erectile dysfunction    GERD (gastroesophageal reflux disease)    Hyperlipidemia    Vertigo      Past Surgical History:  Procedure Laterality Date   COLONOSCOPY  03/19/2011   cleared for 5 yrs- Dr @ [facility])   [place] Bilateral 12/04/2020   Procedure: LYMPHADENECTOMY, PELVIC;  Surgeon: Raynelle Bring, MD;  Location: [facility];  Service: Urology;  Laterality: Bilateral;   ROBOT ASSISTED LAPAROSCOPIC RADICAL PROSTATECTOMY N/A 12/04/2020   Procedure: XI ROBOTIC ASSISTED LAPAROSCOPIC RADICAL PROSTATECTOMY LEVEL 2;  Surgeon: Raynelle Bring, MD;  Location: [facility];  Service: Urology;  Laterality: N/A;       Review of Systems  Constitutional:  Negative for appetite change, chills, fatigue, fever and unexpected weight change.  Eyes:  Negative for visual disturbance.  Respiratory:  Negative for cough, chest tightness, shortness of breath and wheezing.   Cardiovascular:  Negative for chest pain and leg swelling.  Gastrointestinal:  Negative for abdominal pain, constipation, diarrhea, nausea and vomiting.  Genitourinary:  Negative for dysuria, flank pain, frequency, genital sores, hematuria and urgency.  Skin:  Negative for rash.  Allergic/Immunologic: Negative for immunocompromised state.  Neurological:  Negative for dizziness and headaches.      Objective:    There were no vitals taken for this visit. Nursing note and vital signs reviewed.  Physical Exam Constitutional:      General: He is not in acute distress.    Appearance: He is well-developed.  Eyes:     Conjunctiva/sclera: Conjunctivae normal.  Cardiovascular:     Rate and Rhythm: Normal rate and regular rhythm.     Heart sounds: Normal heart sounds. No murmur heard.    No friction rub. No gallop.  Pulmonary:     Effort: Pulmonary effort is normal. No respiratory distress.     Breath sounds: Normal breath sounds. No wheezing or rales.  Chest:     Chest wall: No tenderness.  Abdominal:     General: Bowel sounds are normal.     Palpations: Abdomen is soft.     Tenderness: There is no abdominal tenderness.  Musculoskeletal:     Cervical back: Neck supple.  Lymphadenopathy:     Cervical:  No cervical adenopathy.  Skin:    General: Skin is warm and dry.     Findings: No rash.  Neurological:     Mental Status: He is alert and oriented to person, place, and time.  Psychiatric:        Mood and Affect: Mood normal.         09/05/2021    1:43 PM 06/05/2021    1:45 PM 11/22/2020    3:09 PM 08/29/2020    7:45 AM 08/07/2020    2:02 PM  Depression screen PHQ 2/9  Decreased Interest 0 0 0 0 0  Down, Depressed, Hopeless 0 1 0 0 0  PHQ - 2 Score 0 1 0 0 0  Altered sleeping    0   Tired, decreased energy    0   Change in appetite    0   Feeling bad or failure about yourself     0   Trouble concentrating    0   Moving slowly or fidgety/restless    0   Suicidal thoughts    0   PHQ-9 Score    0   Difficult doing work/chores    Not difficult at all        Assessment & Plan:    Patient Active Problem List   Diagnosis Date Noted   CAD (coronary artery disease) 09/20/2021   Therapeutic drug monitoring 09/05/2021   Precordial chest pain 07/19/2021   Elevated blood pressure reading 07/19/2021   History of prostate cancer 12/04/2020   Malignant neoplasm of prostate ([place]) 10/18/2020   History of diverticulitis 08/29/2020   Elevated PSA 04/20/2020   High risk homosexual behavior 09/23/2018   Preventative health care 08/24/2018   On pre-exposure prophylaxis for HIV 12/17/2017   Chronic left shoulder pain 08/13/2017   Nocturia 08/13/2017   Exposure to HIV 06/06/2017   Erectile dysfunction 06/28/2015   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   No orders of the defined types were placed in  this encounter.    Follow-up: No follow-ups on file.   Terri Piedra, MSN, FNP-C Nurse Practitioner [facility] [place] number: [phone]

## 2021-12-19 ENCOUNTER — Encounter: Payer: Self-pay | Admitting: Family

## 2021-12-19 ENCOUNTER — Other Ambulatory Visit: Payer: Self-pay

## 2021-12-19 ENCOUNTER — Other Ambulatory Visit (HOSPITAL_COMMUNITY)
Admission: RE | Admit: 2021-12-19 | Discharge: 2021-12-19 | Disposition: A | Discharge status: Home / Self Care | Payer: 59 | Source: Ambulatory Visit | Attending: Family | Admitting: Family

## 2021-12-19 ENCOUNTER — Ambulatory Visit: Payer: 59 | Admitting: Family

## 2021-12-19 ENCOUNTER — Other Ambulatory Visit (HOSPITAL_COMMUNITY): Payer: Self-pay

## 2021-12-19 VITALS — BP 128/76 | HR 85 | Temp 98.2°F | Wt 154.0 lb

## 2021-12-19 DIAGNOSIS — Z79899 Other long term (current) drug therapy: Secondary | ICD-10-CM | POA: Diagnosis not present

## 2021-12-19 MED ORDER — EMTRICITABINE-TENOFOVIR AF 200-25 MG PO TABS
1.0000 | ORAL_TABLET | Freq: Every day | ORAL | 3 refills | Status: DC
  Filled 2021-12-19 – 2022-01-08 (×2): qty 30, 30d supply, fill #0
  Filled 2022-02-06: qty 30, 30d supply, fill #1
  Filled 2022-03-06 (×3): qty 30, 30d supply, fill #2
  Filled 2022-04-03 (×2): qty 30, 30d supply, fill #3

## 2021-12-19 NOTE — Assessment & Plan Note (Signed)
Richardson Landry continues to take Descovy as prescribed with no adverse side effects. Check lab work today. Discussed importance of safe sexual practice and condom use. Condoms and STD testing offered. Continue current dose of Descovy. Plan for follow up in 3 months or sooner if needed.

## 2021-12-19 NOTE — Patient Instructions (Signed)
Nice to see you.  We will check your lab work today.  Continue to take your medication daily as prescribed.  Refills have been sent to the pharmacy.  Plan for follow up in 3 months or sooner if needed with lab work on the same day.  Have a great day and stay safe!  

## 2021-12-20 LAB — COMPREHENSIVE METABOLIC PANEL
AG Ratio: 2 (calc) (ref 1.0–2.5)
ALT: 24 U/L (ref 9–46)
AST: 23 U/L (ref 10–35)
Albumin: 4.9 g/dL (ref 3.6–5.1)
Alkaline phosphatase (APISO): 61 U/L (ref 35–144)
BUN: 15 mg/dL (ref 7–25)
CO2: 27 mmol/L (ref 20–32)
Calcium: 9.6 mg/dL (ref 8.6–10.3)
Chloride: 103 mmol/L (ref 98–110)
Creat: 0.96 mg/dL (ref 0.70–1.35)
Globulin: 2.5 g/dL (calc) (ref 1.9–3.7)
Glucose, Bld: 90 mg/dL (ref 65–99)
Potassium: 4 mmol/L (ref 3.5–5.3)
Sodium: 140 mmol/L (ref 135–146)
Total Bilirubin: 0.4 mg/dL (ref 0.2–1.2)
Total Protein: 7.4 g/dL (ref 6.1–8.1)

## 2021-12-20 LAB — RPR: RPR Ser Ql: NONREACTIVE

## 2021-12-20 LAB — CYTOLOGY, (ORAL, ANAL, URETHRAL) ANCILLARY ONLY
Chlamydia: NEGATIVE
Chlamydia: NEGATIVE
Comment: NEGATIVE
Comment: NEGATIVE
Comment: NORMAL
Comment: NORMAL
Neisseria Gonorrhea: NEGATIVE
Neisseria Gonorrhea: NEGATIVE

## 2021-12-20 LAB — URINE CYTOLOGY ANCILLARY ONLY
Chlamydia: NEGATIVE
Comment: NEGATIVE
Comment: NORMAL
Neisseria Gonorrhea: NEGATIVE

## 2021-12-20 LAB — HIV ANTIBODY (ROUTINE TESTING W REFLEX): HIV 1&2 Ab, 4th Generation: NONREACTIVE

## 2021-12-25 ENCOUNTER — Other Ambulatory Visit (HOSPITAL_COMMUNITY): Payer: Self-pay

## 2021-12-28 ENCOUNTER — Other Ambulatory Visit (HOSPITAL_COMMUNITY): Payer: Self-pay

## 2021-12-31 DIAGNOSIS — L578 Other skin changes due to chronic exposure to nonionizing radiation: Secondary | ICD-10-CM | POA: Diagnosis not present

## 2021-12-31 DIAGNOSIS — L720 Epidermal cyst: Secondary | ICD-10-CM | POA: Diagnosis not present

## 2021-12-31 DIAGNOSIS — Z86018 Personal history of other benign neoplasm: Secondary | ICD-10-CM | POA: Diagnosis not present

## 2021-12-31 DIAGNOSIS — Z872 Personal history of diseases of the skin and subcutaneous tissue: Secondary | ICD-10-CM | POA: Diagnosis not present

## 2021-12-31 DIAGNOSIS — L570 Actinic keratosis: Secondary | ICD-10-CM | POA: Diagnosis not present

## 2021-12-31 DIAGNOSIS — D1801 Hemangioma of skin and subcutaneous tissue: Secondary | ICD-10-CM | POA: Diagnosis not present

## 2021-12-31 DIAGNOSIS — D2239 Melanocytic nevi of other parts of face: Secondary | ICD-10-CM | POA: Diagnosis not present

## 2021-12-31 DIAGNOSIS — D485 Neoplasm of uncertain behavior of skin: Secondary | ICD-10-CM | POA: Diagnosis not present

## 2021-12-31 DIAGNOSIS — L905 Scar conditions and fibrosis of skin: Secondary | ICD-10-CM | POA: Diagnosis not present

## 2022-01-07 ENCOUNTER — Other Ambulatory Visit (HOSPITAL_COMMUNITY): Payer: Self-pay

## 2022-01-08 ENCOUNTER — Other Ambulatory Visit (HOSPITAL_COMMUNITY): Payer: Self-pay

## 2022-02-04 ENCOUNTER — Other Ambulatory Visit (HOSPITAL_COMMUNITY): Payer: Self-pay

## 2022-02-06 ENCOUNTER — Other Ambulatory Visit (HOSPITAL_COMMUNITY): Payer: Self-pay

## 2022-02-06 ENCOUNTER — Other Ambulatory Visit: Payer: Self-pay | Admitting: Primary Care

## 2022-02-06 DIAGNOSIS — N529 Male erectile dysfunction, unspecified: Secondary | ICD-10-CM

## 2022-02-06 MED ORDER — SILDENAFIL CITRATE 100 MG PO TABS
ORAL_TABLET | ORAL | 0 refills | Status: DC
  Filled 2022-02-06: qty 18, 90d supply, fill #0
  Filled 2022-02-08: qty 72, 72d supply, fill #1

## 2022-02-08 ENCOUNTER — Other Ambulatory Visit (HOSPITAL_COMMUNITY): Payer: Self-pay

## 2022-02-13 ENCOUNTER — Other Ambulatory Visit (HOSPITAL_COMMUNITY): Payer: Self-pay

## 2022-02-13 DIAGNOSIS — H11421 Conjunctival edema, right eye: Secondary | ICD-10-CM | POA: Diagnosis not present

## 2022-02-13 DIAGNOSIS — H0288A Meibomian gland dysfunction right eye, upper and lower eyelids: Secondary | ICD-10-CM | POA: Diagnosis not present

## 2022-02-13 DIAGNOSIS — C61 Malignant neoplasm of prostate: Secondary | ICD-10-CM | POA: Diagnosis not present

## 2022-02-13 MED ORDER — NEOMYCIN-POLYMYXIN-DEXAMETH 0.1 % OP SUSP
1.0000 [drp] | Freq: Four times a day (QID) | OPHTHALMIC | 0 refills | Status: DC
  Filled 2022-02-13: qty 5, 20d supply, fill #0

## 2022-03-04 ENCOUNTER — Other Ambulatory Visit (HOSPITAL_COMMUNITY): Payer: Self-pay

## 2022-03-05 ENCOUNTER — Other Ambulatory Visit (HOSPITAL_COMMUNITY): Payer: Self-pay

## 2022-03-06 ENCOUNTER — Other Ambulatory Visit (HOSPITAL_COMMUNITY): Payer: Self-pay

## 2022-03-06 ENCOUNTER — Other Ambulatory Visit: Payer: Self-pay

## 2022-03-06 DIAGNOSIS — N393 Stress incontinence (female) (male): Secondary | ICD-10-CM | POA: Diagnosis not present

## 2022-03-06 DIAGNOSIS — C61 Malignant neoplasm of prostate: Secondary | ICD-10-CM | POA: Diagnosis not present

## 2022-03-06 DIAGNOSIS — N5201 Erectile dysfunction due to arterial insufficiency: Secondary | ICD-10-CM | POA: Diagnosis not present

## 2022-03-12 ENCOUNTER — Other Ambulatory Visit: Payer: Self-pay

## 2022-03-19 ENCOUNTER — Encounter: Payer: Self-pay | Admitting: Gastroenterology

## 2022-03-28 ENCOUNTER — Other Ambulatory Visit (HOSPITAL_COMMUNITY): Payer: Self-pay

## 2022-03-29 ENCOUNTER — Other Ambulatory Visit (HOSPITAL_COMMUNITY): Payer: Self-pay

## 2022-04-02 ENCOUNTER — Other Ambulatory Visit (HOSPITAL_COMMUNITY): Payer: Self-pay

## 2022-04-03 ENCOUNTER — Other Ambulatory Visit: Payer: Self-pay | Admitting: Primary Care

## 2022-04-03 ENCOUNTER — Other Ambulatory Visit (HOSPITAL_COMMUNITY): Payer: Self-pay

## 2022-04-03 DIAGNOSIS — N529 Male erectile dysfunction, unspecified: Secondary | ICD-10-CM

## 2022-04-04 ENCOUNTER — Other Ambulatory Visit (HOSPITAL_COMMUNITY): Payer: Self-pay

## 2022-04-04 MED ORDER — SILDENAFIL CITRATE 100 MG PO TABS
ORAL_TABLET | ORAL | 1 refills | Status: DC
  Filled 2022-04-04 – 2022-06-17 (×2): qty 90, 90d supply, fill #0
  Filled 2022-10-14: qty 90, 90d supply, fill #1

## 2022-04-08 ENCOUNTER — Other Ambulatory Visit: Payer: Self-pay

## 2022-04-08 ENCOUNTER — Ambulatory Visit (INDEPENDENT_AMBULATORY_CARE_PROVIDER_SITE_OTHER): Payer: Commercial Managed Care - PPO | Admitting: Family

## 2022-04-08 ENCOUNTER — Other Ambulatory Visit (HOSPITAL_COMMUNITY)
Admission: RE | Admit: 2022-04-08 | Discharge: 2022-04-08 | Disposition: A | Discharge status: Home / Self Care | Payer: Commercial Managed Care - PPO | Source: Ambulatory Visit | Attending: Family | Admitting: Family

## 2022-04-08 ENCOUNTER — Other Ambulatory Visit (HOSPITAL_COMMUNITY): Payer: Self-pay

## 2022-04-08 ENCOUNTER — Encounter: Payer: Self-pay | Admitting: Family

## 2022-04-08 VITALS — BP 129/81 | HR 78 | Resp 16 | Ht 67.0 in | Wt 154.0 lb

## 2022-04-08 DIAGNOSIS — Z79899 Other long term (current) drug therapy: Secondary | ICD-10-CM | POA: Insufficient documentation

## 2022-04-08 DIAGNOSIS — Z113 Encounter for screening for infections with a predominantly sexual mode of transmission: Secondary | ICD-10-CM | POA: Insufficient documentation

## 2022-04-08 MED ORDER — EMTRICITABINE-TENOFOVIR AF 200-25 MG PO TABS
1.0000 | ORAL_TABLET | Freq: Every day | ORAL | 3 refills | Status: DC
  Filled 2022-04-08 – 2022-04-24 (×2): qty 30, 30d supply, fill #0
  Filled 2022-05-24: qty 30, 30d supply, fill #1
  Filled 2022-06-18: qty 30, 30d supply, fill #2

## 2022-04-08 NOTE — Patient Instructions (Signed)
Nice to see you.  We will check your lab work today.  Continue to take your medication daily as prescribed.  Refills have been sent to the pharmacy.  Plan for follow up in 3 months or sooner if needed with lab work on the same day.  Have a great day and stay safe!  

## 2022-04-08 NOTE — Progress Notes (Signed)
Subjective:    Patient ID: James Barry, male    DOB: 15-Dec-1955, 67 y.o.   MRN: [id number]  Chief Complaint  Patient presents with   Follow-up    HPI:  James Barry is a 67 y.o. male on pre-exposure prophylaxis last seen on on 12/19/21 with good adherence and tolerance to Descovy. HIV and STD testing was negative. Here today for follow up.  James Barry has been doing well since last office visit. Taking Descovy as prescribed with no adverse side effects or problems getting medication from the pharmacy. No new concerns/complaints. Condoms and STD testing offered.  Denies fevers, chills, night sweats, headaches, changes in vision, neck pain/stiffness, nausea, diarrhea, vomiting, lesions or rashes.    Allergies  Allergen Reactions   Buspirone     Unaware    Cephalexin     Unaware       Outpatient Medications Prior to Visit  Medication Sig Dispense Refill   acetaminophen (TYLENOL) 650 MG CR tablet Take 1,300 mg by mouth every morning.     aspirin EC 81 MG tablet Take 1 tablet (81 mg total) by mouth daily. Swallow whole. 90 tablet 3   docusate sodium (COLACE) 100 MG capsule Take 1 capsule (100 mg total) by mouth 2 (two) times daily.     esomeprazole (NEXIUM) 40 MG capsule Take 1 capsule  by mouth daily for heartburn. 90 capsule 3   metoprolol tartrate (LOPRESSOR) 50 MG tablet Take one tablet (2) hours before your CT scan as directed. 1 tablet 0   neomycin-polymyxin-dexamethasone (MAXITROL) 0.1 % ophthalmic suspension Place 1 drop into the right eye 4 (four) times daily. 5 mL 0   rosuvastatin (CRESTOR) 10 MG tablet Take 1 tablet (10 mg total) by mouth daily. 90 tablet 3   sildenafil (VIAGRA) 100 MG tablet Take 1 tablet by mouth 30 - 60 minutes prior to sexual activity as needed. 90 tablet 1   tobramycin-dexamethasone (TOBRADEX) ophthalmic solution Instill 1 drop into both eyes four times a day 5 mL 0   emtricitabine-tenofovir AF (DESCOVY) 200-25 MG tablet TAKE 1 TABLET BY MOUTH  DAILY. 30 tablet 3   No facility-administered medications prior to visit.     Past Medical History:  Diagnosis Date   Acute neck pain 08/19/2019   Acute thoracic back pain 07/30/2019   Arthritis    Cancer ([place])    prostate   Clavicular asymmetry 07/30/2019   Diverticulosis    Erectile dysfunction    GERD (gastroesophageal reflux disease)    Hyperlipidemia    Vertigo      Past Surgical History:  Procedure Laterality Date   COLONOSCOPY  03/19/2011   cleared for 5 yrs- Dr @ [facility])   [place] Bilateral 12/04/2020   Procedure: LYMPHADENECTOMY, PELVIC;  Surgeon: Raynelle Bring, MD;  Location: [facility];  Service: Urology;  Laterality: Bilateral;   ROBOT ASSISTED LAPAROSCOPIC RADICAL PROSTATECTOMY N/A 12/04/2020   Procedure: XI ROBOTIC ASSISTED LAPAROSCOPIC RADICAL PROSTATECTOMY LEVEL 2;  Surgeon: Raynelle Bring, MD;  Location: [facility];  Service: Urology;  Laterality: N/A;       Review of Systems  Constitutional:  Negative for appetite change, chills, fatigue, fever and unexpected weight change.  Eyes:  Negative for visual disturbance.  Respiratory:  Negative for cough, chest tightness, shortness of breath and wheezing.   Cardiovascular:  Negative for chest pain and leg swelling.  Gastrointestinal:  Negative for abdominal pain, constipation, diarrhea, nausea and vomiting.  Genitourinary:  Negative for dysuria, flank pain, frequency, genital sores, hematuria and urgency.  Skin:  Negative for rash.  Allergic/Immunologic: Negative for immunocompromised state.  Neurological:  Negative for dizziness and headaches.      Objective:    BP 129/81   Pulse 78   Resp 16   Ht '5\' 7"'$  (1.702 m)   Wt 154 lb (69.9 kg)   SpO2 98%   BMI 24.12 kg/m  Nursing note and vital signs reviewed.  Physical Exam Constitutional:      General: He is not in acute distress.    Appearance: He is well-developed.  Eyes:     Conjunctiva/sclera: Conjunctivae  normal.  Cardiovascular:     Rate and Rhythm: Normal rate and regular rhythm.     Heart sounds: Normal heart sounds. No murmur heard.    No friction rub. No gallop.  Pulmonary:     Effort: Pulmonary effort is normal. No respiratory distress.     Breath sounds: Normal breath sounds. No wheezing or rales.  Chest:     Chest wall: No tenderness.  Abdominal:     General: Bowel sounds are normal.     Palpations: Abdomen is soft.     Tenderness: There is no abdominal tenderness.  Musculoskeletal:     Cervical back: Neck supple.  Lymphadenopathy:     Cervical: No cervical adenopathy.  Skin:    General: Skin is warm and dry.     Findings: No rash.  Neurological:     Mental Status: He is alert and oriented to person, place, and time.  Psychiatric:        Behavior: Behavior normal.        Thought Content: Thought content normal.        Judgment: Judgment normal.         04/08/2022   10:47 AM 09/05/2021    1:43 PM 06/05/2021    1:45 PM 11/22/2020    3:09 PM 08/29/2020    7:45 AM  Depression screen PHQ 2/9  Decreased Interest 0 0 0 0 0  Down, Depressed, Hopeless 0 0 1 0 0  PHQ - 2 Score 0 0 1 0 0  Altered sleeping     0  Tired, decreased energy     0  Change in appetite     0  Feeling bad or failure about yourself      0  Trouble concentrating     0  Moving slowly or fidgety/restless     0  Suicidal thoughts     0  PHQ-9 Score     0  Difficult doing work/chores     Not difficult at all       Assessment & Plan:    Patient Active Problem List   Diagnosis Date Noted   CAD (coronary artery disease) 09/20/2021   Therapeutic drug monitoring 09/05/2021   Precordial chest pain 07/19/2021   Elevated blood pressure reading 07/19/2021   History of prostate cancer 12/04/2020   Malignant neoplasm of prostate ([place]) 10/18/2020   History of diverticulitis 08/29/2020   Elevated PSA 04/20/2020   High risk homosexual behavior 09/23/2018   Preventative health care 08/24/2018   On  pre-exposure prophylaxis for HIV 12/17/2017   Chronic left shoulder pain 08/13/2017   Nocturia 08/13/2017   Exposure to HIV 06/06/2017   Erectile dysfunction 06/28/2015   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 STD and HIV testing. Discussed importance of safe sexual practice and condom use. Condoms offered. Continue current dose of Descovy. Plan for follow up in 3 months or sooner if needed.       Relevant Medications   emtricitabine-tenofovir AF (DESCOVY) 200-25 MG tablet   Other Relevant Orders   Urine cytology ancillary only   Cytology (oral, anal, urethral) ancillary only   Cytology (oral, anal, urethral) ancillary only   RPR   HIV Antibody (routine testing w rflx)   COMPLETE METABOLIC PANEL WITH GFR   Other Visit Diagnoses     Screening for STDs (sexually transmitted diseases)    -  Primary   Relevant Orders   Urine cytology ancillary only   Cytology (oral, anal, urethral) ancillary only   Cytology (oral, anal, urethral) ancillary only   RPR   HIV Antibody (routine testing w rflx)        I am having James Nay "James Barry" maintain his acetaminophen, docusate sodium, tobramycin-dexamethasone, metoprolol tartrate, rosuvastatin, esomeprazole, aspirin EC, neomycin-polymyxin-dexamethasone, sildenafil, and emtricitabine-tenofovir AF.   Meds ordered this encounter  Medications   emtricitabine-tenofovir AF (DESCOVY) 200-25 MG tablet    Sig: TAKE 1 TABLET BY MOUTH DAILY.    Dispense:  30 tablet    Refill:  3    Order Specific Question:   Supervising Provider    Answer:   Carlyle Basques [[id number]]     Follow-up: Return in about 3 months (around 07/08/2022), or if symptoms  worsen or fail to improve.   Terri Piedra, MSN, FNP-C Nurse Practitioner [facility] [place] number: [phone]

## 2022-04-08 NOTE — Assessment & Plan Note (Signed)
James Barry continues to take Descovy with good tolerance and no adverse side effects. STD and HIV testing. Discussed importance of safe sexual practice and condom use. Condoms offered. Continue current dose of Descovy. Plan for follow up in 3 months or sooner if needed.

## 2022-04-09 LAB — URINE CYTOLOGY ANCILLARY ONLY
Chlamydia: NEGATIVE
Comment: NEGATIVE
Comment: NORMAL
Neisseria Gonorrhea: NEGATIVE

## 2022-04-09 LAB — COMPLETE METABOLIC PANEL WITH GFR
AG Ratio: 1.8 (calc) (ref 1.0–2.5)
ALT: 19 U/L (ref 9–46)
AST: 19 U/L (ref 10–35)
Albumin: 4.9 g/dL (ref 3.6–5.1)
Alkaline phosphatase (APISO): 60 U/L (ref 35–144)
BUN: 13 mg/dL (ref 7–25)
CO2: 26 mmol/L (ref 20–32)
Calcium: 9.6 mg/dL (ref 8.6–10.3)
Chloride: 101 mmol/L (ref 98–110)
Creat: 1 mg/dL (ref 0.70–1.35)
Globulin: 2.8 g/dL (calc) (ref 1.9–3.7)
Glucose, Bld: 89 mg/dL (ref 65–99)
Potassium: 3.5 mmol/L (ref 3.5–5.3)
Sodium: 139 mmol/L (ref 135–146)
Total Bilirubin: 0.3 mg/dL (ref 0.2–1.2)
Total Protein: 7.7 g/dL (ref 6.1–8.1)
eGFR: 83 mL/min/{1.73_m2} (ref >=60)

## 2022-04-09 LAB — CYTOLOGY, (ORAL, ANAL, URETHRAL) ANCILLARY ONLY
Chlamydia: NEGATIVE
Chlamydia: NEGATIVE
Comment: NEGATIVE
Comment: NEGATIVE
Comment: NORMAL
Comment: NORMAL
Neisseria Gonorrhea: NEGATIVE
Neisseria Gonorrhea: NEGATIVE

## 2022-04-09 LAB — RPR: RPR Ser Ql: NONREACTIVE

## 2022-04-09 LAB — HIV ANTIBODY (ROUTINE TESTING W REFLEX): HIV 1&2 Ab, 4th Generation: NONREACTIVE

## 2022-04-17 ENCOUNTER — Ambulatory Visit (AMBULATORY_SURGERY_CENTER): Payer: Commercial Managed Care - PPO

## 2022-04-17 ENCOUNTER — Other Ambulatory Visit (HOSPITAL_COMMUNITY): Payer: Self-pay

## 2022-04-17 VITALS — Ht 67.0 in | Wt 155.0 lb

## 2022-04-17 DIAGNOSIS — Z86010 Personal history of colonic polyps: Secondary | ICD-10-CM

## 2022-04-17 MED ORDER — NA SULFATE-K SULFATE-MG SULF 17.5-3.13-1.6 GM/177ML PO SOLN
1.0000 | Freq: Once | ORAL | 0 refills | Status: AC
  Filled 2022-04-17: qty 354, 30d supply, fill #0

## 2022-04-17 NOTE — Progress Notes (Signed)

## 2022-04-22 ENCOUNTER — Other Ambulatory Visit (HOSPITAL_COMMUNITY): Payer: Self-pay

## 2022-04-22 ENCOUNTER — Encounter: Payer: Self-pay | Admitting: Gastroenterology

## 2022-04-24 ENCOUNTER — Other Ambulatory Visit (HOSPITAL_COMMUNITY): Payer: Self-pay

## 2022-04-26 ENCOUNTER — Other Ambulatory Visit (HOSPITAL_COMMUNITY): Payer: Self-pay

## 2022-05-02 ENCOUNTER — Encounter: Payer: Self-pay | Admitting: Gastroenterology

## 2022-05-02 ENCOUNTER — Ambulatory Visit (AMBULATORY_SURGERY_CENTER): Payer: Commercial Managed Care - PPO | Admitting: Gastroenterology

## 2022-05-02 VITALS — BP 134/88 | HR 80 | Temp 99.1°F | Resp 11 | Ht 67.0 in | Wt 155.0 lb

## 2022-05-02 DIAGNOSIS — I2510 Atherosclerotic heart disease of native coronary artery without angina pectoris: Secondary | ICD-10-CM | POA: Diagnosis not present

## 2022-05-02 DIAGNOSIS — Z86010 Personal history of colonic polyps: Secondary | ICD-10-CM

## 2022-05-02 DIAGNOSIS — D122 Benign neoplasm of ascending colon: Secondary | ICD-10-CM

## 2022-05-02 DIAGNOSIS — Z09 Encounter for follow-up examination after completed treatment for conditions other than malignant neoplasm: Secondary | ICD-10-CM

## 2022-05-02 DIAGNOSIS — Z1211 Encounter for screening for malignant neoplasm of colon: Secondary | ICD-10-CM | POA: Diagnosis not present

## 2022-05-02 DIAGNOSIS — I10 Essential (primary) hypertension: Secondary | ICD-10-CM | POA: Diagnosis not present

## 2022-05-02 DIAGNOSIS — K3580 Unspecified acute appendicitis: Secondary | ICD-10-CM | POA: Diagnosis not present

## 2022-05-02 MED ORDER — SODIUM CHLORIDE 0.9 % IV SOLN: 500.0000 mL | INTRAVENOUS | Status: DC

## 2022-05-02 NOTE — Op Note (Signed)
James Barry Patient Name: James Barry Procedure Date: 05/02/2022 7:47 AM MRN: [id number] Endoscopist: Remo Lipps P. Havery Moros , MD, [id number] Age: 67 Referring MD:  Date of Birth: May 09, 1955 Gender: Male Account #: [id number] Procedure:                Colonoscopy Indications:              High risk colon cancer surveillance: Personal                            history of colonic polyps - last exam 04/2017 -                            [facility] - Dr. Penelope Coop Medicines:                Monitored Anesthesia Care Procedure:                Pre-Anesthesia Assessment:                           - Prior to the procedure, a History and Physical                            was performed, and patient medications and                            allergies were reviewed. The patient's tolerance of                            previous anesthesia was also reviewed. The risks                            and benefits of the procedure and the sedation                            options and risks were discussed with the patient.                            All questions were answered, and informed consent                            was obtained. Prior Anticoagulants: The patient has                            taken no anticoagulant or antiplatelet agents. ASA                            Grade Assessment: II - A patient with mild systemic                            disease. After reviewing the risks and benefits,                            the patient was deemed in satisfactory condition to  undergo the procedure.                           After obtaining informed consent, the colonoscope                            was passed under direct vision. Throughout the                            procedure, the patient's blood pressure, pulse, and                            oxygen saturations were monitored continuously. The                            Olympus CF-HQ190L [id number]) Colonoscope  was                            introduced through the anus and advanced to the the                            terminal ileum, with identification of the                            appendiceal orifice and IC valve. The colonoscopy                            was performed without difficulty. The patient                            tolerated the procedure well. The quality of the                            bowel preparation was good. The terminal ileum,                            ileocecal valve, appendiceal orifice, and rectum                            were photographed. Scope In: 8:08:21 AM Scope Out: 8:24:23 AM Scope Withdrawal Time: 0 hours 13 minutes 39 seconds  Total Procedure Duration: 0 hours 16 minutes 2 seconds  Findings:                 The perianal and digital rectal examinations were                            normal.                           The terminal ileum appeared normal.                           Altered mucosa was found at the appendiceal  orifice. The mucosal was nodular / protuberant, did                            not appear adenomatous but seemed post                            inflammatory, it obliterated the orifice. Biopsies                            were taken with a cold forceps for histology.                           A diminutive polyp was found in the ascending                            colon. The polyp was sessile. The polyp was removed                            with a cold biopsy forceps. Resection and retrieval                            were complete.                           Many small-mouthed diverticula were found in the                            transverse colon and left colon. Superficial                            erythema noted in the sigmoid colon in multiple                            areas around diverticulosis. Severe diverticulosis                            in the left colon.                            Internal hemorrhoids were found during                            retroflexion. The hemorrhoids were small.                           The exam was otherwise without abnormality. Complications:            No immediate complications. Estimated blood loss:                            Minimal. Estimated Blood Loss:     Estimated blood loss was minimal. Impression:               - The examined portion of the ileum was normal.                           -  Abnormal mucosa at the appendiceal orifice as                            outlined - seems ? postinflammatory / benign.                            Biopsied.                           - One diminutive polyp in the ascending colon,                            removed with a cold biopsy forceps. Resected and                            retrieved.                           - Diverticulosis in the transverse colon and in the                            left colon.                           - Internal hemorrhoids.                           - The examination was otherwise normal. Recommendation:           - Patient has a contact number available for                            emergencies. The signs and symptoms of potential                            delayed complications were discussed with the                            patient. Return to normal activities tomorrow.                            Written discharge instructions were provided to the                            patient.                           - Resume previous diet.                           - Continue present medications.                           - Await pathology results. Consideration for CT                            scan abdomen / pelvis to make sure no  abnormality                            of the appendix given endoscopic findings, will                            discuss with the patient James Barry. James Botting, MD 05/02/2022 8:31:30 AM This report has been signed electronically.

## 2022-05-02 NOTE — Progress Notes (Signed)
Pt resting comfortably. VSS. Airway intact. SBAR complete to RN. All questions answered.   

## 2022-05-02 NOTE — Progress Notes (Signed)
Pt's states no medical or surgical changes since previsit or office visit. 

## 2022-05-02 NOTE — Patient Instructions (Addendum)
Resume previous diet. Continue present medications. Await pathology results. Consideration for CT scan abdomen / pelvis to make sure no abnormality of the appendix given endoscopic findings, will discuss with the patient   YOU HAD AN ENDOSCOPIC PROCEDURE TODAY AT [facility]:   Refer to the procedure report that was given to you for any specific questions about what was found during the examination.  If the procedure report does not answer your questions, please call your gastroenterologist to clarify.  If you requested that your care partner not be given the details of your procedure findings, then the procedure report has been included in a sealed envelope for you to review at your convenience later.  YOU SHOULD EXPECT: Some feelings of bloating in the abdomen. Passage of more gas than usual.  Walking can help get rid of the air that was put into your GI tract during the procedure and reduce the bloating. If you had a lower endoscopy (such as a colonoscopy or flexible sigmoidoscopy) you may notice spotting of blood in your stool or on the toilet paper. If you underwent a bowel prep for your procedure, you may not have a normal bowel movement for a few days.  Please Note:  You might notice some irritation and congestion in your nose or some drainage.  This is from the oxygen used during your procedure.  There is no need for concern and it should clear up in a day or so.  SYMPTOMS TO REPORT IMMEDIATELY:  Following lower endoscopy (colonoscopy or flexible sigmoidoscopy):  Excessive amounts of blood in the stool  Significant tenderness or worsening of abdominal pains  Swelling of the abdomen that is new, acute  Fever of 100F or higher   For urgent or emergent issues, a gastroenterologist can be reached at any hour by calling [phone]. Do not use MyChart messaging for urgent concerns.    DIET:  We do recommend a small meal at first, but then you may proceed to your  regular diet.  Drink plenty of fluids but you should avoid alcoholic beverages for 24 hours.  ACTIVITY:  You should plan to take it easy for the rest of today and you should NOT DRIVE or use heavy machinery until tomorrow (because of the sedation medicines used during the test).    FOLLOW UP: Our staff will call the number listed on your records the next business day following your procedure.  We will call around 7:15- 8:00 am to check on you and address any questions or concerns that you may have regarding the information given to you following your procedure. If we do not reach you, we will leave a message.     If any biopsies were taken you will be contacted by phone or by letter within the next 1-3 weeks.  Please call us at [phone] if you have not heard about the biopsies in 3 weeks.    SIGNATURES/CONFIDENTIALITY: You and/or your care partner have signed paperwork which will be entered into your electronic medical record.  These signatures attest to the fact that that the information above on your After Visit Summary has been reviewed and is understood.  Full responsibility of the confidentiality of this discharge information lies with you and/or your care-partner.

## 2022-05-02 NOTE — Progress Notes (Signed)
[facility] History and Physical   Primary Care Physician:  Pleas Koch, NP   Reason for Procedure:   History of colon polyps  Plan:    colonoscopy     HPI: James Barry is a 67 y.o. male  here for colonoscopy surveillance - history of polyps, last exam [facility] Dr. Penelope Coop 04/2017.   Patient denies any bowel symptoms at this time. No family history of colon cancer known. Otherwise feels well without any cardiopulmonary symptoms.   I have discussed risks / benefits of anesthesia and endoscopic procedure with James Barry and they wish to proceed with the exams as outlined today.    Past Medical History:  Diagnosis Date   Acute neck pain 08/19/2019   Acute thoracic back pain 07/30/2019   Arthritis    Cancer ([place])    prostate   Clavicular asymmetry 07/30/2019   Diverticulosis    Erectile dysfunction    GERD (gastroesophageal reflux disease)    Hyperlipidemia    Vertigo     Past Surgical History:  Procedure Laterality Date   COLONOSCOPY  03/19/2011   cleared for 5 yrs- Dr @ [facility])   [place] Bilateral 12/04/2020   Procedure: LYMPHADENECTOMY, PELVIC;  Surgeon: Raynelle Bring, MD;  Location: [facility];  Service: Urology;  Laterality: Bilateral;   ROBOT ASSISTED LAPAROSCOPIC RADICAL PROSTATECTOMY N/A 12/04/2020   Procedure: XI ROBOTIC ASSISTED LAPAROSCOPIC RADICAL PROSTATECTOMY LEVEL 2;  Surgeon: Raynelle Bring, MD;  Location: [facility];  Service: Urology;  Laterality: N/A;   UPPER GASTROINTESTINAL ENDOSCOPY      Prior to Admission medications   Medication Sig Start Date End Date Taking? Authorizing Provider  acetaminophen (TYLENOL) 650 MG CR tablet Take 1,300 mg by mouth every morning.   Yes [provider]  aspirin EC 81 MG tablet Take 1 tablet (81 mg total) by mouth daily. Swallow whole. 11/02/21  Yes Jerline Pain, MD  emtricitabine-tenofovir AF (DESCOVY) 200-25 MG tablet TAKE 1 TABLET BY MOUTH DAILY.  04/08/22 08/06/22 Yes Golden Circle, FNP  esomeprazole (NEXIUM) 40 MG capsule Take 1 capsule  by mouth daily for heartburn. 09/21/21 09/21/22 Yes Pleas Koch, NP  rosuvastatin (CRESTOR) 10 MG tablet Take 1 tablet (10 mg total) by mouth daily. 08/27/21  Yes Jerline Pain, MD  sildenafil (VIAGRA) 100 MG tablet Take 1 tablet by mouth 30 - 60 minutes prior to sexual activity as needed. 04/04/22  Yes Pleas Koch, NP  docusate sodium (COLACE) 100 MG capsule Take 1 capsule (100 mg total) by mouth 2 (two) times daily. 12/04/20   Debbrah Alar, PA-C  metoprolol tartrate (LOPRESSOR) 50 MG tablet Take one tablet (2) hours before your CT scan as directed. Patient not taking: Reported on 04/17/2022 07/19/21   Jerline Pain, MD  neomycin-polymyxin-dexamethasone (MAXITROL) 0.1 % ophthalmic suspension Place 1 drop into the right eye 4 (four) times daily. Patient not taking: Reported on 04/17/2022 02/13/22     tobramycin-dexamethasone (TOBRADEX) ophthalmic solution Instill 1 drop into both eyes four times a day Patient not taking: Reported on 04/17/2022 05/21/21       Current Outpatient Medications  Medication Sig Dispense Refill   acetaminophen (TYLENOL) 650 MG CR tablet Take 1,300 mg by mouth every morning.     aspirin EC 81 MG tablet Take 1 tablet (81 mg total) by mouth daily. Swallow whole. 90 tablet 3   emtricitabine-tenofovir AF (DESCOVY) 200-25 MG tablet TAKE 1 TABLET BY  MOUTH DAILY. 30 tablet 3   esomeprazole (NEXIUM) 40 MG capsule Take 1 capsule  by mouth daily for heartburn. 90 capsule 3   rosuvastatin (CRESTOR) 10 MG tablet Take 1 tablet (10 mg total) by mouth daily. 90 tablet 3   sildenafil (VIAGRA) 100 MG tablet Take 1 tablet by mouth 30 - 60 minutes prior to sexual activity as needed. 90 tablet 1   docusate sodium (COLACE) 100 MG capsule Take 1 capsule (100 mg total) by mouth 2 (two) times daily.     metoprolol tartrate (LOPRESSOR) 50 MG tablet Take one tablet (2) hours before your CT scan as  directed. (Patient not taking: Reported on 04/17/2022) 1 tablet 0   neomycin-polymyxin-dexamethasone (MAXITROL) 0.1 % ophthalmic suspension Place 1 drop into the right eye 4 (four) times daily. (Patient not taking: Reported on 04/17/2022) 5 mL 0   tobramycin-dexamethasone (TOBRADEX) ophthalmic solution Instill 1 drop into both eyes four times a day (Patient not taking: Reported on 04/17/2022) 5 mL 0   Current Facility-Administered Medications  Medication Dose Route Frequency Provider Last Rate Last Admin   0.9 %  sodium chloride infusion  500 mL Intravenous Continuous Pollyann Roa, [name], MD        Allergies as of 05/02/2022 - Review Complete 05/02/2022  Allergen Reaction Noted   Buspirone  07/14/2014   Cephalexin  07/14/2014    Family History  Problem Relation Age of Onset   Breast cancer Mother    Lung cancer Father    Heart disease Father    Hypertension Father    Arthritis Maternal Grandmother    Colon cancer Neg Hx    Colon polyps Neg Hx    Esophageal cancer Neg Hx    Rectal cancer Neg Hx    Stomach cancer Neg Hx     Social History   Socioeconomic History   Marital status: Married    Spouse name: Not on file   Number of children: Not on file   Years of education: Not on file   Highest education level: Not on file  Occupational History   Not on file  Tobacco Use   Smoking status: Never   Smokeless tobacco: Never  Vaping Use   Vaping Use: Never used  Substance and Sexual Activity   Alcohol use: Yes    Alcohol/week: 0.0 standard drinks of alcohol    Comment: social   Drug use: No   Sexual activity: Yes    Partners: Male    Comment: offered condoms  Other Topics Concern   Not on file  Social History Narrative   Married.   No children.   Retired.   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   Social Determinants of Health   Financial Resource Strain: Not on file  Food Insecurity: Not on file  Transportation Needs: Not on  file  Physical Activity: Not on file  Stress: Not on file  Social Connections: Not on file  Intimate Partner Violence: Not on file    Review of Systems: All other review of systems negative except as mentioned in the HPI.  Physical Exam: Vital signs BP 126/82   Pulse 86   Temp 99.1 F (37.3 C)   Ht '5\' 7"'$  (1.702 m)   Wt 155 lb (70.3 kg)   SpO2 97%   BMI 24.28 kg/m   General:   Alert,  Well-developed, pleasant and cooperative in NAD Lungs:  Clear throughout to auscultation.   Heart:  Regular  rate and rhythm Abdomen:  Soft, nontender and nondistended.   Neuro/Psych:  Alert and cooperative. Normal mood and affect. A and O x 3  Jolly Mango, MD [facility]

## 2022-05-02 NOTE — Progress Notes (Signed)
Called to room to assist during endoscopic procedure.  Patient ID and intended procedure confirmed with present staff. Received instructions for my participation in the procedure from the performing physician.  

## 2022-05-03 ENCOUNTER — Telehealth: Payer: Self-pay

## 2022-05-03 NOTE — Telephone Encounter (Signed)
  Follow up Call-     05/02/2022    7:15 AM  Call back number  Post procedure Call Back phone  # [phone]  Permission to leave phone message Yes     Patient questions:  Do you have a fever, pain , or abdominal swelling? No. Pain Score  0 *  Have you tolerated food without any problems? Yes.    Have you been able to return to your normal activities? Yes.    Do you have any questions about your discharge instructions: Diet   No. Medications  No. Follow up visit  No.  Do you have questions or concerns about your Care? No.  Actions: * If pain score is 4 or above: No action needed, pain <4.

## 2022-05-06 ENCOUNTER — Other Ambulatory Visit (HOSPITAL_COMMUNITY): Payer: Self-pay

## 2022-05-09 ENCOUNTER — Other Ambulatory Visit: Payer: Self-pay

## 2022-05-09 DIAGNOSIS — K389 Disease of appendix, unspecified: Secondary | ICD-10-CM

## 2022-05-10 ENCOUNTER — Telehealth: Payer: Self-pay

## 2022-05-10 NOTE — Telephone Encounter (Signed)
CT appt has been scheduled for Monday, 05/27/22 at 4:30 pm.

## 2022-05-24 ENCOUNTER — Other Ambulatory Visit (HOSPITAL_COMMUNITY): Payer: Self-pay

## 2022-05-24 ENCOUNTER — Other Ambulatory Visit: Payer: Self-pay

## 2022-05-27 ENCOUNTER — Ambulatory Visit (HOSPITAL_COMMUNITY)
Admission: RE | Admit: 2022-05-27 | Discharge: 2022-05-27 | Disposition: A | Discharge status: Home / Self Care | Payer: Commercial Managed Care - PPO | Source: Ambulatory Visit | Attending: Gastroenterology | Admitting: Gastroenterology

## 2022-05-27 DIAGNOSIS — K389 Disease of appendix, unspecified: Secondary | ICD-10-CM | POA: Diagnosis not present

## 2022-05-27 DIAGNOSIS — K760 Fatty (change of) liver, not elsewhere classified: Secondary | ICD-10-CM | POA: Diagnosis not present

## 2022-05-27 MED ORDER — IOHEXOL 300 MG/ML  SOLN
100.0000 mL | Freq: Once | INTRAMUSCULAR | Status: AC | PRN
  Administered 2022-05-27: 100 mL via INTRAVENOUS

## 2022-05-27 MED ORDER — IOHEXOL 9 MG/ML PO SOLN
500.0000 mL | ORAL | Status: AC
  Administered 2022-05-27 (×2): 500 mL via ORAL

## 2022-05-27 MED ORDER — IOHEXOL 9 MG/ML PO SOLN
ORAL | Status: AC
  Filled 2022-05-27: qty 1000

## 2022-06-17 ENCOUNTER — Other Ambulatory Visit (HOSPITAL_COMMUNITY): Payer: Self-pay

## 2022-06-18 ENCOUNTER — Other Ambulatory Visit (HOSPITAL_COMMUNITY): Payer: Self-pay

## 2022-06-20 ENCOUNTER — Other Ambulatory Visit (HOSPITAL_COMMUNITY): Payer: Self-pay

## 2022-06-21 ENCOUNTER — Other Ambulatory Visit (HOSPITAL_COMMUNITY): Payer: Self-pay

## 2022-07-03 DIAGNOSIS — Z872 Personal history of diseases of the skin and subcutaneous tissue: Secondary | ICD-10-CM | POA: Diagnosis not present

## 2022-07-03 DIAGNOSIS — L578 Other skin changes due to chronic exposure to nonionizing radiation: Secondary | ICD-10-CM | POA: Diagnosis not present

## 2022-07-03 DIAGNOSIS — Z86018 Personal history of other benign neoplasm: Secondary | ICD-10-CM | POA: Diagnosis not present

## 2022-07-03 DIAGNOSIS — L720 Epidermal cyst: Secondary | ICD-10-CM | POA: Diagnosis not present

## 2022-07-03 DIAGNOSIS — L821 Other seborrheic keratosis: Secondary | ICD-10-CM | POA: Diagnosis not present

## 2022-07-05 ENCOUNTER — Encounter: Payer: Self-pay | Admitting: Family

## 2022-07-05 ENCOUNTER — Other Ambulatory Visit: Payer: Self-pay

## 2022-07-05 ENCOUNTER — Ambulatory Visit: Payer: Commercial Managed Care - PPO | Admitting: Family

## 2022-07-05 ENCOUNTER — Other Ambulatory Visit (HOSPITAL_COMMUNITY): Payer: Self-pay

## 2022-07-05 VITALS — BP 129/87 | HR 70 | Temp 98.0°F | Ht 67.0 in | Wt 157.0 lb

## 2022-07-05 DIAGNOSIS — Z79899 Other long term (current) drug therapy: Secondary | ICD-10-CM | POA: Diagnosis not present

## 2022-07-05 DIAGNOSIS — Z113 Encounter for screening for infections with a predominantly sexual mode of transmission: Secondary | ICD-10-CM

## 2022-07-05 MED ORDER — EMTRICITABINE-TENOFOVIR AF 200-25 MG PO TABS
1.0000 | ORAL_TABLET | Freq: Every day | ORAL | 3 refills | Status: DC
  Filled 2022-07-05 – 2022-07-17 (×2): qty 30, 30d supply, fill #0
  Filled 2022-08-13: qty 30, 30d supply, fill #1
  Filled 2022-09-17 – 2022-10-02 (×3): qty 30, 30d supply, fill #2

## 2022-07-05 NOTE — Patient Instructions (Signed)
Nice to see you.  We will check your lab work today.  Continue to take your medication daily as prescribed.  Refills have been sent to the pharmacy.  Plan for follow up in 3 months or sooner if needed with lab work on the same day.  Have a great day and stay safe!  

## 2022-07-05 NOTE — Progress Notes (Signed)
Subjective:    Patient ID: James Barry, male    DOB: [birth year], 67 y.o.   MRN: [id number]  Chief Complaint  Patient presents with   Follow-up   PrEP    HPI:  James Barry is a 67 y.o. male on preexposure prophylaxis for HIV last seen on 04/08/2022 with good adherence and tolerance to Descovy.  HIV and STD testing negative.  Here today for follow-up.  James Barry has been doing well since his last office visit with no new concerns/complaints.  Continues to take Descovy as prescribed with no adverse side effects or problems getting medication from the pharmacy.  Condoms and STD testing offered.  Denies fevers, chills, night sweats, headaches, changes in vision, neck pain/stiffness, nausea, diarrhea, vomiting, lesions or rashes.    Allergies  Allergen Reactions   Buspirone     Unaware    Cephalexin     Unaware       Outpatient Medications Prior to Visit  Medication Sig Dispense Refill   acetaminophen (TYLENOL) 650 MG CR tablet Take 1,300 mg by mouth every morning.     aspirin EC 81 MG tablet Take 1 tablet (81 mg total) by mouth daily. Swallow whole. 90 tablet 3   docusate sodium (COLACE) 100 MG capsule Take 1 capsule (100 mg total) by mouth 2 (two) times daily.     esomeprazole (NEXIUM) 40 MG capsule Take 1 capsule  by mouth daily for heartburn. 90 capsule 3   rosuvastatin (CRESTOR) 10 MG tablet Take 1 tablet (10 mg total) by mouth daily. 90 tablet 3   sildenafil (VIAGRA) 100 MG tablet Take 1 tablet by mouth 30 - 60 minutes prior to sexual activity as needed. 90 tablet 1   emtricitabine-tenofovir AF (DESCOVY) 200-25 MG tablet TAKE 1 TABLET BY MOUTH DAILY. 30 tablet 3   metoprolol tartrate (LOPRESSOR) 50 MG tablet Take one tablet (2) hours before your CT scan as directed. (Patient not taking: Reported on 04/17/2022) 1 tablet 0   neomycin-polymyxin-dexamethasone (MAXITROL) 0.1 % ophthalmic suspension Place 1 drop into the right eye 4 (four) times daily. (Patient not taking:  Reported on 04/17/2022) 5 mL 0   tobramycin-dexamethasone (TOBRADEX) ophthalmic solution Instill 1 drop into both eyes four times a day (Patient not taking: Reported on 04/17/2022) 5 mL 0   No facility-administered medications prior to visit.     Past Medical History:  Diagnosis Date   Acute neck pain 08/19/2019   Acute thoracic back pain 07/30/2019   Arthritis    Cancer    prostate   Clavicular asymmetry 07/30/2019   Diverticulosis    Erectile dysfunction    GERD (gastroesophageal reflux disease)    Hyperlipidemia    Vertigo      Past Surgical History:  Procedure Laterality Date   COLONOSCOPY  03/19/2011   cleared for 5 yrs- Dr @ [facility] [employer])   HERNIA REPAIR     LYMPHADENECTOMY Bilateral 12/04/2020   Procedure: LYMPHADENECTOMY, PELVIC;  Surgeon: Heloise Purpura, MD;  Location: [facility];  Service: Urology;  Laterality: Bilateral;   ROBOT ASSISTED LAPAROSCOPIC RADICAL PROSTATECTOMY N/A 12/04/2020   Procedure: XI ROBOTIC ASSISTED LAPAROSCOPIC RADICAL PROSTATECTOMY LEVEL 2;  Surgeon: Heloise Purpura, MD;  Location: [facility];  Service: Urology;  Laterality: N/A;   UPPER GASTROINTESTINAL ENDOSCOPY         Review of Systems  Constitutional:  Negative for appetite change, chills, fatigue, fever and unexpected weight change.  Eyes:  Negative for visual disturbance.  Respiratory:  Negative  for cough, chest tightness, shortness of breath and wheezing.   Cardiovascular:  Negative for chest pain and leg swelling.  Gastrointestinal:  Negative for abdominal pain, constipation, diarrhea, nausea and vomiting.  Genitourinary:  Negative for dysuria, flank pain, frequency, genital sores, hematuria and urgency.  Skin:  Negative for rash.  Allergic/Immunologic: Negative for immunocompromised state.  Neurological:  Negative for dizziness and headaches.      Objective:    BP 129/87   Pulse 70   Temp 98 F (36.7 C) (Temporal)   Ht  (1.702 m)   Wt 157 lb (71.2 kg)   SpO2  97%   BMI 24.59 kg/m  Nursing note and vital signs reviewed.  Physical Exam Constitutional:      General: He is not in acute distress.    Appearance: He is well-developed.  Eyes:     Conjunctiva/sclera: Conjunctivae normal.  Cardiovascular:     Rate and Rhythm: Normal rate and regular rhythm.     Heart sounds: Normal heart sounds. No murmur heard.    No friction rub. No gallop.  Pulmonary:     Effort: Pulmonary effort is normal. No respiratory distress.     Breath sounds: Normal breath sounds. No wheezing or rales.  Chest:     Chest wall: No tenderness.  Abdominal:     General: Bowel sounds are normal.     Palpations: Abdomen is soft.     Tenderness: There is no abdominal tenderness.  Musculoskeletal:     Cervical back: Neck supple.  Lymphadenopathy:     Cervical: No cervical adenopathy.  Skin:    General: Skin is warm and dry.     Findings: No rash.  Neurological:     Mental Status: He is alert and oriented to person, place, and time.  Psychiatric:        Behavior: Behavior normal.        Thought Content: Thought content normal.        Judgment: Judgment normal.         07/05/2022   10:45 AM 04/08/2022   10:47 AM 09/05/2021    1:43 PM 06/05/2021    1:45 PM 11/22/2020    3:09 PM  Depression screen PHQ 2/9  Decreased Interest 0 0 0 0 0  Down, Depressed, Hopeless 0 0 0 1 0  PHQ - 2 Score 0 0 0 1 0       Assessment & Plan:    Patient Active Problem List   Diagnosis Date Noted   CAD (coronary artery disease) 09/20/2021   Therapeutic drug monitoring 09/05/2021   Precordial chest pain 07/19/2021   Elevated blood pressure reading 07/19/2021   History of prostate cancer 12/04/2020   Malignant neoplasm of prostate 10/18/2020   History of diverticulitis 08/29/2020   Elevated PSA 04/20/2020   High risk homosexual behavior 09/23/2018   Preventative health care 08/24/2018   On pre-exposure prophylaxis for HIV 12/17/2017   Chronic left shoulder pain 08/13/2017    Nocturia 08/13/2017   Exposure to HIV 06/06/2017   Erectile dysfunction 06/28/2015   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  Reviewed previous lab work.  Condoms and STD testing offered.  Check lab work.  Continue current dose of Descovy.  Plan for follow-up in 3 months or sooner if needed.      Relevant Medications   emtricitabine-tenofovir AF (DESCOVY) 200-25 MG tablet   Other Relevant Orders   CT/NG RNA, TMA Rectal   GC/CT Probe, Amp (Throat)   C. trachomatis/N. gonorrhoeae RNA   HIV Antibody (routine testing w rflx)   RPR   Other Visit Diagnoses     Screening for STDs (sexually transmitted diseases)       Relevant Orders   CT/NG RNA, TMA Rectal   GC/CT Probe, Amp (Throat)   C. trachomatis/N. gonorrhoeae RNA   HIV Antibody (routine testing w rflx)   RPR        I am having Tawana Scale "James Barry" maintain his acetaminophen, docusate sodium, tobramycin-dexamethasone, metoprolol tartrate, rosuvastatin, esomeprazole, aspirin EC, neomycin-polymyxin-dexamethasone, sildenafil, and emtricitabine-tenofovir AF.   Meds ordered this encounter  Medications   emtricitabine-tenofovir AF (DESCOVY) 200-25 MG tablet    Sig: TAKE 1 TABLET BY MOUTH DAILY.    Dispense:  30 tablet    Refill:  3    Order Specific Question:   Supervising Provider    Answer:   Judyann Munson [[id number]]     Follow-up: Return in about 3 months (around 10/04/2022), or if symptoms worsen or fail to improve.   Marcos Eke, MSN, FNP-C Nurse Practitioner [facility] for Infectious Disease [facility] Main number: [phone]

## 2022-07-05 NOTE — Assessment & Plan Note (Signed)
James Barry continues to have good adherence and tolerance to Descovy.  Reviewed previous lab work.  Condoms and STD testing offered.  Check lab work.  Continue current dose of Descovy.  Plan for follow-up in 3 months or sooner if needed.

## 2022-07-06 LAB — GC/CHLAMYDIA PROBE, AMP (THROAT)
Chlamydia trachomatis RNA: NOT DETECTED
Neisseria gonorrhoeae RNA: NOT DETECTED

## 2022-07-06 LAB — CT/NG RNA, TMA RECTAL
Chlamydia Trachomatis RNA: NOT DETECTED
Neisseria Gonorrhoeae RNA: NOT DETECTED

## 2022-07-06 LAB — C. TRACHOMATIS/N. GONORRHOEAE RNA
C. trachomatis RNA, TMA: NOT DETECTED
N. gonorrhoeae RNA, TMA: NOT DETECTED

## 2022-07-06 LAB — RPR: RPR Ser Ql: NONREACTIVE

## 2022-07-06 LAB — HIV ANTIBODY (ROUTINE TESTING W REFLEX): HIV 1&2 Ab, 4th Generation: NONREACTIVE

## 2022-07-11 ENCOUNTER — Other Ambulatory Visit (HOSPITAL_COMMUNITY): Payer: Self-pay

## 2022-07-11 DIAGNOSIS — J029 Acute pharyngitis, unspecified: Secondary | ICD-10-CM | POA: Diagnosis not present

## 2022-07-11 DIAGNOSIS — B349 Viral infection, unspecified: Secondary | ICD-10-CM | POA: Diagnosis not present

## 2022-07-15 ENCOUNTER — Other Ambulatory Visit (HOSPITAL_COMMUNITY): Payer: Self-pay

## 2022-07-17 ENCOUNTER — Other Ambulatory Visit (HOSPITAL_COMMUNITY): Payer: Self-pay

## 2022-07-17 ENCOUNTER — Other Ambulatory Visit: Payer: Self-pay

## 2022-07-18 ENCOUNTER — Other Ambulatory Visit (HOSPITAL_COMMUNITY): Payer: Self-pay

## 2022-07-18 ENCOUNTER — Other Ambulatory Visit: Payer: Self-pay

## 2022-08-08 ENCOUNTER — Other Ambulatory Visit (HOSPITAL_COMMUNITY): Payer: Self-pay

## 2022-08-08 ENCOUNTER — Other Ambulatory Visit: Payer: Self-pay | Admitting: Cardiology

## 2022-08-08 MED ORDER — ROSUVASTATIN CALCIUM 10 MG PO TABS
10.0000 mg | ORAL_TABLET | Freq: Every day | ORAL | 0 refills | Status: DC
  Filled 2022-08-08: qty 90, 90d supply, fill #0

## 2022-08-13 ENCOUNTER — Other Ambulatory Visit (HOSPITAL_COMMUNITY): Payer: Self-pay

## 2022-08-14 ENCOUNTER — Other Ambulatory Visit (HOSPITAL_COMMUNITY): Payer: Self-pay

## 2022-08-15 ENCOUNTER — Other Ambulatory Visit (HOSPITAL_COMMUNITY): Payer: Self-pay

## 2022-08-16 ENCOUNTER — Other Ambulatory Visit (HOSPITAL_COMMUNITY): Payer: Self-pay

## 2022-08-18 NOTE — Progress Notes (Unsigned)
    Jozsef Wescoat T. Perl Kerney, MD, CAQ Sports [facility] at [facility] [address] [place], [address]  Phone: [phone]  FAX: [phone]  James Barry - 67 y.o. male  MRN [id number]  Date of Birth: [birth year]  Date: 08/19/2022  PCP: Doreene Nest, NP  Referral: Doreene Nest, NP  No chief complaint on file.  Subjective:   James Barry is a 67 y.o. very pleasant male patient with There is no height or weight on file to calculate BMI. who presents with the following:  Patient presents with an ongoing lesion on his penis.    Review of Systems is noted in the HPI, as appropriate  Objective:   There were no vitals taken for this visit.  GEN: No acute distress; alert,appropriate. PULM: Breathing comfortably in no respiratory distress PSYCH: Normally interactive.   Laboratory and Imaging Data:  Assessment and Plan:   ***

## 2022-08-19 ENCOUNTER — Ambulatory Visit: Payer: Commercial Managed Care - PPO | Admitting: Family Medicine

## 2022-08-19 ENCOUNTER — Encounter: Payer: Self-pay | Admitting: Family Medicine

## 2022-08-19 VITALS — BP 132/82 | HR 88 | Temp 97.4°F | Ht 67.0 in | Wt 156.0 lb

## 2022-08-19 DIAGNOSIS — N489 Disorder of penis, unspecified: Secondary | ICD-10-CM

## 2022-08-28 ENCOUNTER — Other Ambulatory Visit: Payer: Self-pay

## 2022-09-04 ENCOUNTER — Other Ambulatory Visit: Payer: Self-pay

## 2022-09-17 ENCOUNTER — Other Ambulatory Visit (HOSPITAL_COMMUNITY): Payer: Self-pay

## 2022-09-18 ENCOUNTER — Other Ambulatory Visit (HOSPITAL_COMMUNITY): Payer: Self-pay

## 2022-09-20 ENCOUNTER — Other Ambulatory Visit (HOSPITAL_COMMUNITY): Payer: Self-pay

## 2022-09-23 ENCOUNTER — Other Ambulatory Visit (HOSPITAL_COMMUNITY): Payer: Self-pay

## 2022-09-24 ENCOUNTER — Ambulatory Visit (INDEPENDENT_AMBULATORY_CARE_PROVIDER_SITE_OTHER): Payer: Commercial Managed Care - PPO | Admitting: Primary Care

## 2022-09-24 ENCOUNTER — Ambulatory Visit (INDEPENDENT_AMBULATORY_CARE_PROVIDER_SITE_OTHER)
Admission: RE | Admit: 2022-09-24 | Discharge: 2022-09-24 | Disposition: A | Discharge status: Home / Self Care | Payer: Commercial Managed Care - PPO | Source: Ambulatory Visit | Attending: Primary Care | Admitting: Primary Care

## 2022-09-24 ENCOUNTER — Encounter: Payer: Self-pay | Admitting: Primary Care

## 2022-09-24 VITALS — BP 126/82 | HR 82 | Temp 98.4°F | Ht 67.0 in | Wt 156.0 lb

## 2022-09-24 DIAGNOSIS — Z8546 Personal history of malignant neoplasm of prostate: Secondary | ICD-10-CM | POA: Diagnosis not present

## 2022-09-24 DIAGNOSIS — K219 Gastro-esophageal reflux disease without esophagitis: Secondary | ICD-10-CM | POA: Diagnosis not present

## 2022-09-24 DIAGNOSIS — I10 Essential (primary) hypertension: Secondary | ICD-10-CM | POA: Diagnosis not present

## 2022-09-24 DIAGNOSIS — M79671 Pain in right foot: Secondary | ICD-10-CM

## 2022-09-24 DIAGNOSIS — I2510 Atherosclerotic heart disease of native coronary artery without angina pectoris: Secondary | ICD-10-CM

## 2022-09-24 DIAGNOSIS — Z79899 Other long term (current) drug therapy: Secondary | ICD-10-CM

## 2022-09-24 DIAGNOSIS — Z0000 Encounter for general adult medical examination without abnormal findings: Secondary | ICD-10-CM

## 2022-09-24 DIAGNOSIS — N529 Male erectile dysfunction, unspecified: Secondary | ICD-10-CM | POA: Diagnosis not present

## 2022-09-24 DIAGNOSIS — Z125 Encounter for screening for malignant neoplasm of prostate: Secondary | ICD-10-CM | POA: Diagnosis not present

## 2022-09-24 DIAGNOSIS — M19071 Primary osteoarthritis, right ankle and foot: Secondary | ICD-10-CM | POA: Diagnosis not present

## 2022-09-24 HISTORY — DX: Pain in right foot: M79.671

## 2022-09-24 LAB — PSA: PSA: 0.01 ng/mL — ABNORMAL LOW (ref 0.10–4.00)

## 2022-09-24 LAB — URIC ACID: Uric Acid, Serum: 6.5 mg/dL (ref 4.0–7.8)

## 2022-09-24 LAB — LIPID PANEL
Cholesterol: 107 mg/dL (ref 0–200)
HDL: 43.9 mg/dL (ref >39.00)
LDL Cholesterol: 37 mg/dL (ref 0–99)
NonHDL: 63.31
Total CHOL/HDL Ratio: 2
Triglycerides: 134 mg/dL (ref 0.0–149.0)
VLDL: 26.8 mg/dL (ref 0.0–40.0)

## 2022-09-24 NOTE — Assessment & Plan Note (Signed)
Controlled. ?Continue Nexium 40 mg daily. ?

## 2022-09-24 NOTE — Assessment & Plan Note (Signed)
Following with Urology.  In remission.   Repeat PSA pending.

## 2022-09-24 NOTE — Assessment & Plan Note (Signed)
Controlled.  Continue to monitor.  Continue off medication.

## 2022-09-24 NOTE — Patient Instructions (Signed)
Stop by the lab an xray prior to leaving today. I will notify you of your results once received.   It was a pleasure to see you today!

## 2022-09-24 NOTE — Assessment & Plan Note (Signed)
Following with cardiology. Office notes reviewed from August 2023.  Continue rosuvastatin 10 mg daily.  Repeat lipid panel pending.

## 2022-09-24 NOTE — Assessment & Plan Note (Signed)
Likely arthritis.  Checking uric acid to rule out gout. Xray of the right foot ordered and pending  Continue Tylenol Arthritis PRN.

## 2022-09-24 NOTE — Assessment & Plan Note (Signed)
Controlled.  Continue Viagra 100 mg daily PRN

## 2022-09-24 NOTE — Assessment & Plan Note (Addendum)
Following with infectious disease, reviewed office notes and labs from April 2024. Continue Descovy 200-25 mg daily

## 2022-09-24 NOTE — Progress Notes (Signed)
Subjective:    Patient ID: James Barry, male    DOB: [birth year], 67 y.o.   MRN: [id number]  HPI  James Barry is a very pleasant 67 y.o. male who presents today for complete physical and follow up of chronic conditions.  He would also like to mention acute foot pain. Acute pain to the joints of the toes of the right foot. His pain is worse with plantar flexion and extension. Over the last two weeks he's been taking Aleve twice daily. He denies trauma/injury. 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 He recently returned from [facility], did a lot of walking, his foot was overall okay. He denies a history of gout.   Immunizations: -Tetanus: Completed in 2021 -Shingles: Completed Shingrix series -Pneumonia: Completed Prevnar 20 in 2022  Diet: Fair diet.  Exercise: No regular exercise.  Eye exam: Completes annually  Dental exam: Completes semi-annually   Colonoscopy: Completed in 2024, due 2026  PSA: Due  BP Readings from Last 3 Encounters:  09/24/22 126/82  08/19/22 132/82  07/05/22 129/87        Review of Systems  Constitutional:  Negative for unexpected weight change.  HENT:  Negative for rhinorrhea.   Respiratory:  Negative for cough and shortness of breath.   Cardiovascular:  Negative for chest pain.  Gastrointestinal:  Negative for constipation and diarrhea.  Genitourinary:  Negative for difficulty urinating.  Musculoskeletal:  Negative for arthralgias and myalgias.  Skin:  Negative for rash.  Allergic/Immunologic: Negative for environmental allergies.  Neurological:  Negative for dizziness, numbness and headaches.  Psychiatric/Behavioral:  The patient is not nervous/anxious.          Past Medical History:  Diagnosis Date   Acute neck pain 08/19/2019   Acute thoracic back pain 07/30/2019   Arthritis    Cancer [facility])    prostate   Clavicular asymmetry 07/30/2019   Diverticulosis    Elevated  PSA 04/20/2020   Erectile dysfunction    GERD (gastroesophageal reflux disease)    Hyperlipidemia    Vertigo     Social History   Socioeconomic History   Marital status: Married    Spouse name: Not on file   Number of children: Not on file   Years of education: Not on file   Highest education level: Not on file  Occupational History   Not on file  Tobacco Use   Smoking status: Never   Smokeless tobacco: Never  Vaping Use   Vaping Use: Never used  Substance and Sexual Activity   Alcohol use: Yes    Alcohol/week: 0.0 standard drinks of alcohol    Comment: social   Drug use: No   Sexual activity: Yes    Partners: Male    Comment: offered condoms  Other Topics Concern   Not on file  Social History Narrative   Married.   No children.   Retired.   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   Social Determinants of Health   Financial Resource Strain: Not on file  Food Insecurity: Not on file  Transportation Needs: Not on file  Physical Activity: Not on file  Stress: Not on file  Social Connections: Not on file  Intimate Partner Violence: Not on file    Past Surgical History:  Procedure Laterality Date   COLONOSCOPY  03/19/2011   cleared for 5 yrs- Dr @ [facility]  [employer])   HERNIA REPAIR     LYMPHADENECTOMY Bilateral 12/04/2020   Procedure: LYMPHADENECTOMY, PELVIC;  Surgeon: Heloise Purpura, MD;  Location: [facility];  Service: Urology;  Laterality: Bilateral;   ROBOT ASSISTED LAPAROSCOPIC RADICAL PROSTATECTOMY N/A 12/04/2020   Procedure: XI ROBOTIC ASSISTED LAPAROSCOPIC RADICAL PROSTATECTOMY LEVEL 2;  Surgeon: Heloise Purpura, MD;  Location: [facility];  Service: Urology;  Laterality: N/A;   UPPER GASTROINTESTINAL ENDOSCOPY      Family History  Problem Relation Age of Onset   Breast cancer Mother    Lung cancer Father    Heart disease Father    Hypertension Father    Arthritis Maternal Grandmother    Colon cancer Neg Hx     Colon polyps Neg Hx    Esophageal cancer Neg Hx    Rectal cancer Neg Hx    Stomach cancer Neg Hx     Allergies  Allergen Reactions   Buspirone     Unaware    Cephalexin     Unaware     Current Outpatient Medications on File Prior to Visit  Medication Sig Dispense Refill   acetaminophen (TYLENOL) 650 MG CR tablet Take 1,300 mg by mouth every morning.     aspirin EC 81 MG tablet Take 1 tablet (81 mg total) by mouth daily. Swallow whole. 90 tablet 3   docusate sodium (COLACE) 100 MG capsule Take 1 capsule (100 mg total) by mouth 2 (two) times daily.     emtricitabine-tenofovir AF (DESCOVY) 200-25 MG tablet TAKE 1 TABLET BY MOUTH DAILY. 30 tablet 3   esomeprazole (NEXIUM) 40 MG capsule Take 1 capsule  by mouth daily for heartburn. 90 capsule 3   metoprolol tartrate (LOPRESSOR) 50 MG tablet Take one tablet (2) hours before your CT scan as directed. 1 tablet 0   rosuvastatin (CRESTOR) 10 MG tablet Take 1 tablet (10 mg total) by mouth daily. 90 tablet 0   sildenafil (VIAGRA) 100 MG tablet Take 1 tablet by mouth 30 - 60 minutes prior to sexual activity as needed. 90 tablet 1   No current facility-administered medications on file prior to visit.    BP 126/82   Pulse 82   Temp 98.4 F (36.9 C) (Temporal)   Ht 5\' 7"  (1.702 m)   Wt 156 lb (70.8 kg)   SpO2 98%   BMI 24.43 kg/m  Objective:   Physical Exam HENT:     Right Ear: Tympanic membrane and ear canal normal.     Left Ear: Tympanic membrane and ear canal normal.     Nose: Nose normal.     Right Sinus: No maxillary sinus tenderness or frontal sinus tenderness.     Left Sinus: No maxillary sinus tenderness or frontal sinus tenderness.  Eyes:     Conjunctiva/sclera: Conjunctivae normal.  Neck:     Thyroid: No thyromegaly.     Vascular: No carotid bruit.  Cardiovascular:     Rate and Rhythm: Normal rate and regular rhythm.     Heart sounds: Normal heart sounds.  Pulmonary:     Effort: Pulmonary effort is normal.     Breath  sounds: Normal breath sounds. No wheezing or rales.  Abdominal:     General: Bowel sounds are normal.     Palpations: Abdomen is soft.     Tenderness: There is no abdominal tenderness.  Musculoskeletal:        General: Normal range of motion.     Cervical back: Neck supple.  Right foot: Normal range of motion. No swelling or bony tenderness.     Left foot: Normal range of motion. No swelling or bony tenderness.  Skin:    General: Skin is warm and dry.  Neurological:     Mental Status: He is alert and oriented to person, place, and time.     Cranial Nerves: No cranial nerve deficit.     Deep Tendon Reflexes: Reflexes are normal and symmetric.  Psychiatric:        Mood and Affect: Mood normal.           Assessment & Plan:  Preventative health care Assessment & Plan: Immunizations UTD. Colonoscopy UTD, due 2026 PSA due and pending.  Discussed the importance of a healthy diet and regular exercise in order for weight loss, and to reduce the risk of further co-morbidity.  Exam stable. Labs pending.  Follow up in 1 year for repeat physical.    On pre-exposure prophylaxis for HIV Assessment & Plan: Following with infectious disease, reviewed office notes and labs from April 2024. Continue Descovy 200-25 mg daily   Coronary artery disease involving native heart without angina pectoris, unspecified vessel or lesion type Assessment & Plan: Following with cardiology. Office notes reviewed from August 2023.  Continue rosuvastatin 10 mg daily.  Repeat lipid panel pending.  Orders: -     Lipid panel  Essential (primary) hypertension Assessment & Plan: Controlled.  Continue to monitor.  Continue off medication.    History of prostate cancer Assessment & Plan: Following with Urology.  In remission.   Repeat PSA pending.   Orders: -     PSA  Gastroesophageal reflux disease without esophagitis Assessment & Plan: Controlled.  Continue Nexium 40 mg  daily.    Acute foot pain, right Assessment & Plan: Likely arthritis.  Checking uric acid to rule out gout. Xray of the right foot ordered and pending  Continue Tylenol Arthritis PRN.  Orders: -     Uric acid -     DG Foot Complete Right  Screening for prostate cancer -     PSA  Erectile dysfunction, unspecified erectile dysfunction type Assessment & Plan: Controlled.  Continue Viagra 100 mg daily PRN         Doreene Nest, NP

## 2022-09-24 NOTE — Assessment & Plan Note (Signed)
Immunizations UTD. Colonoscopy UTD, due 2026 PSA due and pending.  Discussed the importance of a healthy diet and regular exercise in order for weight loss, and to reduce the risk of further co-morbidity.  Exam stable. Labs pending.  Follow up in 1 year for repeat physical.  

## 2022-09-27 ENCOUNTER — Other Ambulatory Visit (HOSPITAL_COMMUNITY): Payer: Self-pay

## 2022-09-30 ENCOUNTER — Ambulatory Visit: Payer: Commercial Managed Care - PPO | Admitting: Family

## 2022-10-01 ENCOUNTER — Telehealth: Payer: Self-pay | Admitting: Primary Care

## 2022-10-01 ENCOUNTER — Other Ambulatory Visit: Payer: Self-pay

## 2022-10-01 ENCOUNTER — Other Ambulatory Visit: Payer: Self-pay | Admitting: Primary Care

## 2022-10-01 DIAGNOSIS — K219 Gastro-esophageal reflux disease without esophagitis: Secondary | ICD-10-CM

## 2022-10-01 DIAGNOSIS — M79671 Pain in right foot: Secondary | ICD-10-CM

## 2022-10-01 MED ORDER — ESOMEPRAZOLE MAGNESIUM 40 MG PO CPDR
40.0000 mg | DELAYED_RELEASE_CAPSULE | Freq: Every day | ORAL | 3 refills | Status: DC
  Filled 2022-10-01: qty 90, 90d supply, fill #0
  Filled 2022-12-30: qty 90, 90d supply, fill #1
  Filled 2023-03-26: qty 90, 90d supply, fill #2
  Filled 2023-06-23: qty 90, 90d supply, fill #3

## 2022-10-01 NOTE — Telephone Encounter (Signed)
Patient came to office and wanted to leave a message with James Barry that he found a doctor for his foot .hes still having issues with his foot and would like to be referred to [facility].[phone] [address] [place]

## 2022-10-01 NOTE — Telephone Encounter (Signed)
Please let patient know that the referral was placed.  He will either be contacted via phone regarding your referral to podiatry, or he may receive a letter on his MyChart portal from our referral team with instructions for scheduling an appointment. Please let us know if he has not been contacted by anyone within two weeks.

## 2022-10-02 ENCOUNTER — Other Ambulatory Visit (HOSPITAL_COMMUNITY): Payer: Self-pay

## 2022-10-02 ENCOUNTER — Other Ambulatory Visit: Payer: Self-pay

## 2022-10-02 NOTE — Telephone Encounter (Signed)
Left message that referral was done. Per EMR, patient has appt scheduled with Dr. Logan Bores on 10/23/22.

## 2022-10-07 ENCOUNTER — Ambulatory Visit: Payer: Commercial Managed Care - PPO | Admitting: Family

## 2022-10-07 ENCOUNTER — Encounter: Payer: Self-pay | Admitting: Family

## 2022-10-07 ENCOUNTER — Other Ambulatory Visit: Payer: Self-pay

## 2022-10-07 ENCOUNTER — Other Ambulatory Visit (HOSPITAL_COMMUNITY): Payer: Self-pay

## 2022-10-07 ENCOUNTER — Other Ambulatory Visit (HOSPITAL_COMMUNITY)
Admission: RE | Admit: 2022-10-07 | Discharge: 2022-10-07 | Disposition: A | Discharge status: Home / Self Care | Payer: Commercial Managed Care - PPO | Source: Ambulatory Visit | Attending: Family | Admitting: Family

## 2022-10-07 VITALS — BP 142/90 | HR 79 | Ht 67.0 in | Wt 157.0 lb

## 2022-10-07 DIAGNOSIS — Z79899 Other long term (current) drug therapy: Secondary | ICD-10-CM | POA: Insufficient documentation

## 2022-10-07 DIAGNOSIS — B009 Herpesviral infection, unspecified: Secondary | ICD-10-CM

## 2022-10-07 MED ORDER — EMTRICITABINE-TENOFOVIR AF 200-25 MG PO TABS
1.0000 | ORAL_TABLET | Freq: Every day | ORAL | 3 refills | Status: DC
  Filled 2022-10-07 – 2022-10-14 (×2): qty 30, 30d supply, fill #0
  Filled 2022-11-06 (×2): qty 30, 30d supply, fill #1
  Filled 2022-12-03: qty 30, 30d supply, fill #2
  Filled 2023-01-13 (×2): qty 30, 30d supply, fill #3

## 2022-10-07 NOTE — Assessment & Plan Note (Signed)
Brett Canales continues to do well with current dose of Descovy with no adverse side effects.  Check HIV and STD lab work.  Continue current dose of Descovy.  Discussed importance of safe sexual practice and condom use with condoms offered.  Plan for follow-up in 3 months or sooner if needed.

## 2022-10-07 NOTE — Patient Instructions (Signed)
Nice to see you.  We will check your lab work today.  Continue to take your medication daily as prescribed.  Refills have been sent to the pharmacy.  Plan for follow up in 3 months or sooner if needed with lab work on the same day.  Have a great day and stay safe!  

## 2022-10-07 NOTE — Assessment & Plan Note (Signed)
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  Discussed the transmission, outbreak, and treatment options as there are no current cures.  No treatment currently needed. Will contemplate suppressive medication or if he has multiple outbreaks.

## 2022-10-07 NOTE — Progress Notes (Signed)
Subjective:    Patient ID: James Barry, male    DOB: 09/21/1955, 67 y.o.   MRN: [id number]  Chief Complaint  Patient presents with   Follow-up    PREP    HPI:  James Barry is a 67 y.o. male on preexposure prophylaxis for HIV last seen on 07/05/2022 with good adherence and tolerance to Descovy.  HIV and STD testing negative.  In the interim has been seen with concern for penile lesion diagnosed as HSV.  Here today for follow-up.  James Barry has been doing well since his last office visit with resolution of the penile lesion.  Continues to take Descovy as prescribed with no adverse side effects or problems obtaining medication.  Has questions about HSV.  Condoms and STD testing offered. Recently returned from a trip to [place] with plans to visit [place] next.   Denies fevers, chills, night sweats, headaches, changes in vision, neck pain/stiffness, nausea, diarrhea, vomiting, lesions or rashes.    Allergies  Allergen Reactions   Buspirone     Unaware    Cephalexin     Unaware       Outpatient Medications Prior to Visit  Medication Sig Dispense Refill   acetaminophen (TYLENOL) 650 MG CR tablet Take 1,300 mg by mouth every morning.     aspirin EC 81 MG tablet Take 1 tablet (81 mg total) by mouth daily. Swallow whole. 90 tablet 3   docusate sodium (COLACE) 100 MG capsule Take 1 capsule (100 mg total) by mouth 2 (two) times daily.     esomeprazole (NEXIUM) 40 MG capsule Take 1 capsule  by mouth daily for heartburn. 90 capsule 3   metoprolol tartrate (LOPRESSOR) 50 MG tablet Take one tablet (2) hours before your CT scan as directed. 1 tablet 0   rosuvastatin (CRESTOR) 10 MG tablet Take 1 tablet (10 mg total) by mouth daily. 90 tablet 0   sildenafil (VIAGRA) 100 MG tablet Take 1 tablet by mouth 30 - 60 minutes prior to sexual activity as needed. 90 tablet 1   emtricitabine-tenofovir AF (DESCOVY) 200-25 MG tablet TAKE 1 TABLET BY MOUTH DAILY. 30 tablet 3   No facility-administered  medications prior to visit.     Past Medical History:  Diagnosis Date   Acute neck pain 08/19/2019   Acute thoracic back pain 07/30/2019   Arthritis    Cancer (HCC)    prostate   Clavicular asymmetry 07/30/2019   Diverticulosis    Elevated PSA 04/20/2020   Erectile dysfunction    GERD (gastroesophageal reflux disease)    Hyperlipidemia    Vertigo      Past Surgical History:  Procedure Laterality Date   COLONOSCOPY  03/19/2011   cleared for 5 yrs- Dr @ [facility] [employer])   HERNIA REPAIR     LYMPHADENECTOMY Bilateral 12/04/2020   Procedure: LYMPHADENECTOMY, PELVIC;  Surgeon: Heloise Purpura, MD;  Location: [facility];  Service: Urology;  Laterality: Bilateral;   ROBOT ASSISTED LAPAROSCOPIC RADICAL PROSTATECTOMY N/A 12/04/2020   Procedure: XI ROBOTIC ASSISTED LAPAROSCOPIC RADICAL PROSTATECTOMY LEVEL 2;  Surgeon: Heloise Purpura, MD;  Location: [facility];  Service: Urology;  Laterality: N/A;   UPPER GASTROINTESTINAL ENDOSCOPY         Review of Systems  Constitutional:  Negative for appetite change, chills, fatigue, fever and unexpected weight change.  Eyes:  Negative for visual disturbance.  Respiratory:  Negative for cough, chest tightness, shortness of breath and wheezing.   Cardiovascular:  Negative for chest pain and  leg swelling.  Gastrointestinal:  Negative for abdominal pain, constipation, diarrhea, nausea and vomiting.  Genitourinary:  Negative for dysuria, flank pain, frequency, genital sores, hematuria and urgency.  Skin:  Negative for rash.  Allergic/Immunologic: Negative for immunocompromised state.  Neurological:  Negative for dizziness and headaches.      Objective:    BP (!) 142/90   Pulse 79   Ht 5\' 7"  (1.702 m)   Wt 157 lb (71.2 kg)   BMI 24.59 kg/m  Nursing note and vital signs reviewed.  Physical Exam Constitutional:      General: He is not in acute distress.    Appearance: He is well-developed.  Eyes:     Conjunctiva/sclera: Conjunctivae  normal.  Cardiovascular:     Rate and Rhythm: Normal rate and regular rhythm.     Heart sounds: Normal heart sounds. No murmur heard.    No friction rub. No gallop.  Pulmonary:     Effort: Pulmonary effort is normal. No respiratory distress.     Breath sounds: Normal breath sounds. No wheezing or rales.  Chest:     Chest wall: No tenderness.  Abdominal:     General: Bowel sounds are normal.     Palpations: Abdomen is soft.     Tenderness: There is no abdominal tenderness.  Musculoskeletal:     Cervical back: Neck supple.  Lymphadenopathy:     Cervical: No cervical adenopathy.  Skin:    General: Skin is warm and dry.     Findings: No rash.  Neurological:     Mental Status: He is alert.  Psychiatric:        Mood and Affect: Mood normal.         10/07/2022    1:49 PM 09/24/2022   10:36 AM 08/19/2022   11:13 AM 07/05/2022   10:45 AM 04/08/2022   10:47 AM  Depression screen PHQ 2/9  Decreased Interest 0 0 0 0 0  Down, Depressed, Hopeless 0 0 0 0 0  PHQ - 2 Score 0 0 0 0 0  Altered sleeping 0 0 0    Tired, decreased energy 0 0 2    Change in appetite 0 0 0    Feeling bad or failure about yourself  0 0 0    Trouble concentrating 0 0 0    Moving slowly or fidgety/restless 0 0 0    Suicidal thoughts 0 0 0    PHQ-9 Score 0 0 2    Difficult doing work/chores  Not difficult at all Not difficult at all         Assessment & Plan:    Patient Active Problem List   Diagnosis Date Noted   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   Acute foot pain, right 09/24/2022   CAD (coronary artery disease) 09/20/2021   Therapeutic drug monitoring 09/05/2021   Precordial chest pain 07/19/2021   Elevated blood pressure reading 07/19/2021   History of prostate cancer 12/04/2020   Malignant neoplasm of prostate (HCC) 10/18/2020   History of diverticulitis 08/29/2020   High risk homosexual behavior 09/23/2018   Preventative health care 08/24/2018   On pre-exposure prophylaxis for HIV  12/17/2017   Chronic left shoulder pain 08/13/2017   Nocturia 08/13/2017   Exposure to HIV 06/06/2017   Erectile dysfunction 06/28/2015   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  Check HIV and STD lab work.  Continue current dose of Descovy.  Discussed importance of safe sexual practice and condom use with condoms offered.  Plan for follow-up in 3 months or sooner if needed.      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  Discussed the transmission, outbreak, and treatment options as there are no current cures.  No treatment currently needed. Will contemplate suppressive medication or if he has multiple outbreaks.       Relevant Medications   emtricitabine-tenofovir AF (DESCOVY) 200-25 MG tablet     I am having Tawana Scale "James Barry" maintain his acetaminophen, docusate sodium, metoprolol tartrate, aspirin EC, sildenafil, rosuvastatin, esomeprazole, and emtricitabine-tenofovir AF.   Meds ordered this encounter  Medications   emtricitabine-tenofovir AF (DESCOVY) 200-25 MG tablet    Sig: TAKE 1 TABLET BY MOUTH DAILY.    Dispense:  30 tablet    Refill:  3    Order Specific Question:   Supervising Provider    Answer:   Judyann Munson [[id number]]     Follow-up: Return in about 3 months  (around 01/07/2023), or if symptoms worsen or fail to improve.   Marcos Eke, MSN, FNP-C Nurse Practitioner [facility] Main number: [phone]

## 2022-10-08 LAB — BASIC METABOLIC PANEL WITH GFR
BUN: 16 mg/dL (ref 7–25)
CO2: 27 mmol/L (ref 20–32)
Calcium: 10.2 mg/dL (ref 8.6–10.3)
Chloride: 101 mmol/L (ref 98–110)
Creat: 0.95 mg/dL (ref 0.70–1.35)
Glucose, Bld: 82 mg/dL (ref 65–99)
Potassium: 3.9 mmol/L (ref 3.5–5.3)
Sodium: 139 mmol/L (ref 135–146)
eGFR: 88 mL/min/{1.73_m2} (ref >=60)

## 2022-10-08 LAB — HIV ANTIBODY (ROUTINE TESTING W REFLEX): HIV 1&2 Ab, 4th Generation: NONREACTIVE

## 2022-10-08 LAB — RPR: RPR Ser Ql: NONREACTIVE

## 2022-10-09 LAB — CYTOLOGY, (ORAL, ANAL, URETHRAL) ANCILLARY ONLY
Chlamydia: NEGATIVE
Chlamydia: NEGATIVE
Comment: NEGATIVE
Comment: NEGATIVE
Comment: NORMAL
Comment: NORMAL
Neisseria Gonorrhea: NEGATIVE
Neisseria Gonorrhea: NEGATIVE

## 2022-10-09 LAB — URINE CYTOLOGY ANCILLARY ONLY
Chlamydia: NEGATIVE
Comment: NEGATIVE
Comment: NORMAL
Neisseria Gonorrhea: NEGATIVE

## 2022-10-14 ENCOUNTER — Other Ambulatory Visit (HOSPITAL_COMMUNITY): Payer: Self-pay

## 2022-10-14 ENCOUNTER — Other Ambulatory Visit: Payer: Self-pay

## 2022-10-14 ENCOUNTER — Other Ambulatory Visit: Payer: Self-pay | Admitting: Cardiology

## 2022-10-15 ENCOUNTER — Encounter (HOSPITAL_COMMUNITY): Payer: Self-pay

## 2022-10-15 ENCOUNTER — Other Ambulatory Visit (HOSPITAL_COMMUNITY): Payer: Self-pay

## 2022-10-18 ENCOUNTER — Telehealth: Payer: Self-pay | Admitting: Cardiology

## 2022-10-18 ENCOUNTER — Other Ambulatory Visit (HOSPITAL_COMMUNITY): Payer: Self-pay

## 2022-10-18 ENCOUNTER — Other Ambulatory Visit: Payer: Self-pay

## 2022-10-18 DIAGNOSIS — C61 Malignant neoplasm of prostate: Secondary | ICD-10-CM | POA: Diagnosis not present

## 2022-10-18 MED ORDER — ROSUVASTATIN CALCIUM 10 MG PO TABS
10.0000 mg | ORAL_TABLET | Freq: Every day | ORAL | 0 refills | Status: DC
  Filled 2022-10-18: qty 30, 30d supply, fill #0

## 2022-10-18 NOTE — Telephone Encounter (Signed)
*  STAT* If patient is at the pharmacy, call can be transferred to refill team.   1. Which medications need to be refilled? (please list name of each medication and dose if known) rosuvastatin (CRESTOR) 10 MG tablet   2. Which pharmacy/location (including street and city if local pharmacy) is medication to be sent to?  [facility]    3. Do they need a 30 day or 90 day supply? 30  Patient has appt scheduled.

## 2022-10-18 NOTE — Telephone Encounter (Signed)
Refilled requested rosuvastatin (crestor) 10MG  tablet for 30 day supply. Called pharmacy to confirm they received refill.

## 2022-10-19 ENCOUNTER — Other Ambulatory Visit (HOSPITAL_COMMUNITY): Payer: Self-pay

## 2022-10-20 ENCOUNTER — Encounter: Payer: Self-pay | Admitting: Cardiology

## 2022-10-21 ENCOUNTER — Other Ambulatory Visit (HOSPITAL_COMMUNITY): Payer: Self-pay

## 2022-10-21 MED ORDER — ROSUVASTATIN CALCIUM 10 MG PO TABS
10.0000 mg | ORAL_TABLET | Freq: Every day | ORAL | 0 refills | Status: DC
  Filled 2022-10-21 (×2): qty 90, 90d supply, fill #0

## 2022-10-22 ENCOUNTER — Other Ambulatory Visit (HOSPITAL_COMMUNITY): Payer: Self-pay

## 2022-10-23 ENCOUNTER — Ambulatory Visit: Payer: Commercial Managed Care - PPO | Admitting: Podiatry

## 2022-10-25 ENCOUNTER — Other Ambulatory Visit (HOSPITAL_COMMUNITY): Payer: Self-pay

## 2022-10-25 ENCOUNTER — Other Ambulatory Visit: Payer: Self-pay

## 2022-10-30 DIAGNOSIS — N5201 Erectile dysfunction due to arterial insufficiency: Secondary | ICD-10-CM | POA: Diagnosis not present

## 2022-10-30 DIAGNOSIS — C61 Malignant neoplasm of prostate: Secondary | ICD-10-CM | POA: Diagnosis not present

## 2022-10-30 DIAGNOSIS — N393 Stress incontinence (female) (male): Secondary | ICD-10-CM | POA: Diagnosis not present

## 2022-11-06 ENCOUNTER — Other Ambulatory Visit (HOSPITAL_COMMUNITY): Payer: Self-pay

## 2022-11-12 ENCOUNTER — Other Ambulatory Visit (HOSPITAL_COMMUNITY): Payer: Self-pay

## 2022-11-15 ENCOUNTER — Other Ambulatory Visit (HOSPITAL_COMMUNITY): Payer: Self-pay

## 2022-11-19 ENCOUNTER — Other Ambulatory Visit (HOSPITAL_COMMUNITY): Payer: Self-pay

## 2022-11-19 MED ORDER — NEOMYCIN-POLYMYXIN-DEXAMETH 0.1 % OP SUSP
OPHTHALMIC | 0 refills | Status: AC
  Filled 2022-11-19: qty 5, 25d supply, fill #0

## 2022-11-29 ENCOUNTER — Other Ambulatory Visit (HOSPITAL_COMMUNITY): Payer: Self-pay

## 2022-12-03 ENCOUNTER — Other Ambulatory Visit (HOSPITAL_COMMUNITY): Payer: Self-pay

## 2022-12-13 ENCOUNTER — Other Ambulatory Visit: Payer: Self-pay

## 2022-12-16 ENCOUNTER — Other Ambulatory Visit (HOSPITAL_COMMUNITY): Payer: Self-pay

## 2022-12-16 ENCOUNTER — Other Ambulatory Visit: Payer: Self-pay

## 2022-12-30 ENCOUNTER — Other Ambulatory Visit (HOSPITAL_COMMUNITY): Payer: Self-pay

## 2023-01-06 ENCOUNTER — Other Ambulatory Visit (HOSPITAL_COMMUNITY): Payer: Self-pay

## 2023-01-06 ENCOUNTER — Other Ambulatory Visit: Payer: Self-pay | Admitting: Cardiology

## 2023-01-07 ENCOUNTER — Encounter: Payer: Self-pay | Admitting: Cardiology

## 2023-01-07 ENCOUNTER — Other Ambulatory Visit: Payer: Self-pay | Admitting: Cardiology

## 2023-01-07 ENCOUNTER — Ambulatory Visit: Payer: Commercial Managed Care - PPO | Attending: Cardiology | Admitting: Cardiology

## 2023-01-07 ENCOUNTER — Other Ambulatory Visit (HOSPITAL_COMMUNITY): Payer: Self-pay

## 2023-01-07 VITALS — BP 124/80 | HR 66 | Ht 67.0 in | Wt 158.6 lb

## 2023-01-07 DIAGNOSIS — R030 Elevated blood-pressure reading, without diagnosis of hypertension: Secondary | ICD-10-CM | POA: Diagnosis not present

## 2023-01-07 DIAGNOSIS — I10 Essential (primary) hypertension: Secondary | ICD-10-CM | POA: Diagnosis not present

## 2023-01-07 DIAGNOSIS — I2510 Atherosclerotic heart disease of native coronary artery without angina pectoris: Secondary | ICD-10-CM | POA: Diagnosis not present

## 2023-01-07 MED ORDER — ROSUVASTATIN CALCIUM 10 MG PO TABS
10.0000 mg | ORAL_TABLET | Freq: Every day | ORAL | 3 refills | Status: DC
  Filled 2023-01-07 – 2023-01-08 (×2): qty 90, 90d supply, fill #0
  Filled 2023-04-11: qty 90, 90d supply, fill #1
  Filled 2023-07-14: qty 90, 90d supply, fill #2
  Filled 2023-10-09: qty 90, 90d supply, fill #3
  Filled ????-??-??: fill #0

## 2023-01-07 NOTE — Progress Notes (Signed)
Cardiology Office Note:  .   Date:  01/07/2023  ID:  James Barry, DOB 03/13/1956, MRN [id number] PCP: Doreene Nest, NP  [facility] Providers Cardiologist:  None     History of Present Illness: .   James Barry is a 67 y.o. male Discussed the use of AI scribe   History of Present Illness   A 67 year old patient with a history of coronary artery disease and prostate cancer presents for a follow-up visit. The patient's coronary CT scan from 08/23/21 showed a coronary calcium score of 461, mild nonobstructive coronary disease in the proximal LAD and circumflex artery, and aortic atherosclerosis. The patient was started on Crestor 10mg  daily at that time. The initial evaluation was prompted by chest discomfort, described as pain radiating down the left arm, lasting 10-15 minutes during periods of stress. The patient denies associated symptoms such as sweating or jaw pain. The patient has a family history of coronary disease, with a father who had stents. The patient's mother died of breast cancer at age 50. The patient is a nonsmoker and has previously undergone surgery for prostate cancer. The patient's LDL on 09/24/22 was 37, triglycerides were 134, ALT was 19, creatinine was 0.95, and potassium was 3.9. The patient continues to take rosuvastatin 10mg  daily and aspirin 81mg  daily for coronary disease. The patient has had occasional elevated blood pressure readings in the office setting but is not currently on any antihypertensives.       His husband is present with him.  I am seeing him as well.  Previously enjoyed mud run.  Stayed 2 weeks in [place].  1 week in [place].   ROS: No current chest pain.  Worked with physical therapist.  [place]uford Main much better.  Studies Reviewed: .        Results LABS LDL: 37 mg/dL (08/65/7846) Triglycerides: 134 mg/dL (96/29/5284) ALT: 19 U/L (09/24/2022) Creatinine: 0.95 mg/dL (13/24/4010) Potassium: 3.9 mmol/L  (09/24/2022)  RADIOLOGY Coronary CT scan: Coronary calcium score of 461 with mild nonobstructive coronary artery disease in the proximal LAD and circumflex artery. Aortic atherosclerosis. (08/23/2021)  Risk Assessment/Calculations:            Physical Exam:   VS:  BP 124/80   Pulse 66   Ht 5\' 7"  (1.702 m)   Wt 158 lb 9.6 oz (71.9 kg)   SpO2 98%   BMI 24.84 kg/m    Wt Readings from Last 3 Encounters:  01/07/23 158 lb 9.6 oz (71.9 kg)  10/07/22 157 lb (71.2 kg)  09/24/22 156 lb (70.8 kg)    GEN: Well nourished, well developed in no acute distress NECK: No JVD; No carotid bruits CARDIAC: RRR, no murmurs, no rubs, no gallops RESPIRATORY:  Clear to auscultation without rales, wheezing or rhonchi  ABDOMEN: Soft, non-tender, non-distended EXTREMITIES:  No edema; No deformity   ASSESSMENT AND PLAN: .       Coronary Artery Disease Mild nonobstructive disease in the proximal LAD and circumflex artery. LDL well controlled at 37 on Rosuvastatin 10mg  daily. No recent chest pain. -Continue Rosuvastatin 10mg  daily. -Continue Aspirin 81mg  daily. -Encourage continued adherence to Mediterranean diet and regular exercise.  Occasional elevated blood pressure reading without hypertension Occasional elevated readings in office, but generally within normal limits. No antihypertensive medications currently. -Monitor blood pressure regularly. -Continue regular exercise and healthy diet.  Follow-up in 1 year.              Signed, Donato Schultz, MD

## 2023-01-07 NOTE — Patient Instructions (Signed)

## 2023-01-08 ENCOUNTER — Other Ambulatory Visit (HOSPITAL_COMMUNITY): Payer: Self-pay

## 2023-01-08 ENCOUNTER — Other Ambulatory Visit: Payer: Self-pay

## 2023-01-10 ENCOUNTER — Other Ambulatory Visit: Payer: Self-pay

## 2023-01-13 ENCOUNTER — Other Ambulatory Visit: Payer: Self-pay

## 2023-01-13 NOTE — Progress Notes (Signed)
Specialty Pharmacy Refill Coordination Note  James Barry is a 67 y.o. male contacted today regarding refills of specialty medication(s) No data recorded  Patient requested [name] at [facility] [place] date: 01/17/23   Medication will be filled on 01/16/23.

## 2023-01-15 ENCOUNTER — Other Ambulatory Visit (HOSPITAL_COMMUNITY): Payer: Self-pay

## 2023-01-15 ENCOUNTER — Other Ambulatory Visit: Payer: Self-pay

## 2023-01-15 ENCOUNTER — Ambulatory Visit: Payer: Commercial Managed Care - PPO | Admitting: Family

## 2023-01-15 ENCOUNTER — Other Ambulatory Visit (HOSPITAL_COMMUNITY)
Admission: RE | Admit: 2023-01-15 | Discharge: 2023-01-15 | Disposition: A | Discharge status: Home / Self Care | Payer: Commercial Managed Care - PPO | Source: Ambulatory Visit | Attending: Family | Admitting: Family

## 2023-01-15 VITALS — BP 134/83 | HR 72 | Resp 16 | Ht 67.0 in | Wt 152.0 lb

## 2023-01-15 DIAGNOSIS — L570 Actinic keratosis: Secondary | ICD-10-CM | POA: Diagnosis not present

## 2023-01-15 DIAGNOSIS — Z79899 Other long term (current) drug therapy: Secondary | ICD-10-CM

## 2023-01-15 DIAGNOSIS — Z86018 Personal history of other benign neoplasm: Secondary | ICD-10-CM | POA: Diagnosis not present

## 2023-01-15 DIAGNOSIS — L821 Other seborrheic keratosis: Secondary | ICD-10-CM | POA: Diagnosis not present

## 2023-01-15 DIAGNOSIS — L918 Other hypertrophic disorders of the skin: Secondary | ICD-10-CM | POA: Diagnosis not present

## 2023-01-15 DIAGNOSIS — L578 Other skin changes due to chronic exposure to nonionizing radiation: Secondary | ICD-10-CM | POA: Diagnosis not present

## 2023-01-15 DIAGNOSIS — Z872 Personal history of diseases of the skin and subcutaneous tissue: Secondary | ICD-10-CM | POA: Diagnosis not present

## 2023-01-15 MED ORDER — EMTRICITABINE-TENOFOVIR AF 200-25 MG PO TABS
1.0000 | ORAL_TABLET | Freq: Every day | ORAL | 3 refills | Status: DC
  Filled 2023-01-15: qty 30, 30d supply, fill #0
  Filled 2023-02-10: qty 30, 30d supply, fill #1
  Filled 2023-03-04: qty 30, 30d supply, fill #2
  Filled 2023-04-07: qty 30, 30d supply, fill #3

## 2023-01-15 NOTE — Progress Notes (Unsigned)
Subjective:    Patient ID: James Barry, male    DOB: 1955-11-13, 67 y.o.   MRN: [id number]  No chief complaint on file.   HPI:  James Barry is a 67 y.o. male on preexposure prophylaxis for HIV last seen on 10/07/2022 with good adherence and tolerance to Descovy.  HIV and STD testing were negative.  Here today for follow-up.   Denies fevers, chills, night sweats, headaches, changes in vision, neck pain/stiffness, nausea, diarrhea, vomiting, lesions or rashes.   Allergies  Allergen Reactions   Buspirone     Unaware    Cephalexin     Unaware       Outpatient Medications Prior to Visit  Medication Sig Dispense Refill   acetaminophen (TYLENOL) 650 MG CR tablet Take 1,300 mg by mouth every morning.     aspirin EC 81 MG tablet Take 1 tablet (81 mg total) by mouth daily. Swallow whole. 90 tablet 3   docusate sodium (COLACE) 100 MG capsule Take 1 capsule (100 mg total) by mouth 2 (two) times daily.     emtricitabine-tenofovir AF (DESCOVY) 200-25 MG tablet TAKE 1 TABLET BY MOUTH DAILY. 30 tablet 3   esomeprazole (NEXIUM) 40 MG capsule Take 1 capsule  by mouth daily for heartburn. 90 capsule 3   neomycin-polymyxin-dexamethasone (MAXITROL) 0.1 % ophthalmic suspension Instill 1 drop into both eyes four times a day 5 mL 0   rosuvastatin (CRESTOR) 10 MG tablet Take 1 tablet (10 mg total) by mouth daily. 90 tablet 3   sildenafil (VIAGRA) 100 MG tablet Take 1 tablet by mouth 30 - 60 minutes prior to sexual activity as needed. 90 tablet 1   No facility-administered medications prior to visit.     Past Medical History:  Diagnosis Date   Acute neck pain 08/19/2019   Acute thoracic back pain 07/30/2019   Arthritis    Cancer (HCC)    prostate   Clavicular asymmetry 07/30/2019   Diverticulosis    Elevated PSA 04/20/2020   Erectile dysfunction    GERD (gastroesophageal reflux disease)    Hyperlipidemia    Vertigo      Past Surgical History:  Procedure Laterality Date    COLONOSCOPY  03/19/2011   cleared for 5 yrs- Dr @ [facility] [employer])   HERNIA REPAIR     LYMPHADENECTOMY Bilateral 12/04/2020   Procedure: LYMPHADENECTOMY, PELVIC;  Surgeon: Heloise Purpura, MD;  Location: [facility];  Service: Urology;  Laterality: Bilateral;   ROBOT ASSISTED LAPAROSCOPIC RADICAL PROSTATECTOMY N/A 12/04/2020   Procedure: XI ROBOTIC ASSISTED LAPAROSCOPIC RADICAL PROSTATECTOMY LEVEL 2;  Surgeon: Heloise Purpura, MD;  Location: [facility];  Service: Urology;  Laterality: N/A;   UPPER GASTROINTESTINAL ENDOSCOPY         Review of Systems    Objective:    There were no vitals taken for this visit. Nursing note and vital signs reviewed.  Physical Exam      10/07/2022    1:49 PM 09/24/2022   10:36 AM 08/19/2022   11:13 AM 07/05/2022   10:45 AM 04/08/2022   10:47 AM  Depression screen PHQ 2/9  Decreased Interest 0 0 0 0 0  Down, Depressed, Hopeless 0 0 0 0 0  PHQ - 2 Score 0 0 0 0 0  Altered sleeping 0 0 0    Tired, decreased energy 0 0 2    Change in appetite 0 0 0    Feeling bad or failure about yourself  0 0 0  Trouble concentrating 0 0 0    Moving slowly or fidgety/restless 0 0 0    Suicidal thoughts 0 0 0    PHQ-9 Score 0 0 2    Difficult doing work/chores  Not difficult at all Not difficult at all         Assessment & Plan:    Patient Active Problem List   Diagnosis Date Noted   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   Acute foot pain, right 09/24/2022   CAD (coronary artery disease) 09/20/2021   Therapeutic drug monitoring 09/05/2021   Precordial chest pain 07/19/2021   Elevated blood pressure reading 07/19/2021   History of prostate cancer 12/04/2020   Malignant neoplasm of prostate (HCC) 10/18/2020   History of diverticulitis 08/29/2020   High risk homosexual behavior 09/23/2018   Preventative health care 08/24/2018   On pre-exposure prophylaxis for HIV 12/17/2017   Chronic left shoulder pain 08/13/2017   Nocturia 08/13/2017    Exposure to HIV 06/06/2017   Erectile dysfunction 06/28/2015   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   No orders of the defined types were placed in this encounter.    Follow-up: No follow-ups on file. or sooner if needed.   Marcos Eke, MSN, FNP-C Nurse Practitioner [facility] for Infectious Disease [facility] Main number: [phone]

## 2023-01-15 NOTE — Patient Instructions (Signed)
Nice to see you.  We will check your lab work today.  Continue to take your medication daily as prescribed.  Refills have been sent to the pharmacy.  Plan for follow up in 3 months or sooner if needed with lab work on the same day.  Have a great day and stay safe!  

## 2023-01-16 ENCOUNTER — Other Ambulatory Visit: Payer: Self-pay

## 2023-01-16 ENCOUNTER — Encounter: Payer: Self-pay | Admitting: Family

## 2023-01-16 LAB — CYTOLOGY, (ORAL, ANAL, URETHRAL) ANCILLARY ONLY
Chlamydia: NEGATIVE
Chlamydia: NEGATIVE
Comment: NEGATIVE
Comment: NEGATIVE
Comment: NORMAL
Comment: NORMAL
Neisseria Gonorrhea: NEGATIVE
Neisseria Gonorrhea: NEGATIVE

## 2023-01-16 LAB — RPR: RPR Ser Ql: NONREACTIVE

## 2023-01-16 LAB — HIV ANTIBODY (ROUTINE TESTING W REFLEX): HIV 1&2 Ab, 4th Generation: NONREACTIVE

## 2023-01-16 LAB — URINE CYTOLOGY ANCILLARY ONLY
Chlamydia: NEGATIVE
Comment: NEGATIVE
Comment: NORMAL
Neisseria Gonorrhea: NEGATIVE

## 2023-01-16 NOTE — Assessment & Plan Note (Signed)
James Barry continues to do well with current dose of Descovy. Reviewed previous lab work and discussed importance of condom use and safe sexual practice. Check HIV antibody and STD. Continue current dose of Descovy. Plan for follow up in 3 months or sooner if needed with lab work on the same day.

## 2023-02-04 ENCOUNTER — Other Ambulatory Visit: Payer: Self-pay

## 2023-02-06 ENCOUNTER — Other Ambulatory Visit: Payer: Self-pay | Admitting: Pharmacist

## 2023-02-06 NOTE — Progress Notes (Signed)
Specialty Pharmacy Ongoing Clinical Assessment Note  James Barry is a 67 y.o. male who is being followed by the specialty pharmacy service for RxSp HIV PrEP   Patient's specialty medication(s) reviewed today: Emtricitabine-Tenofovir Af   Missed doses in the last 4 weeks: 0   Patient/Caregiver did not have any additional questions or concerns.   Therapeutic benefit summary: Patient is achieving benefit   Adverse events/side effects summary: No adverse events/side effects   Patient's therapy is appropriate to: Continue    Goals Addressed             This Visit's Progress    Comply with lab assessments       Patient is on track. Patient will maintain adherence      Maintain optimal adherence to therapy       Patient is on track. Patient will maintain adherence      Practice safe sex       Patient is on track. Patient will  utilize condoms appropriately.         Follow up:  6 months  Jennette Kettle Specialty Pharmacist

## 2023-02-10 ENCOUNTER — Other Ambulatory Visit (HOSPITAL_COMMUNITY): Payer: Self-pay

## 2023-02-10 ENCOUNTER — Other Ambulatory Visit: Payer: Self-pay

## 2023-02-10 NOTE — Progress Notes (Signed)
Specialty Pharmacy Refill Coordination Note  James Barry is a 67 y.o. male contacted today regarding refills of specialty medication(s) Emtricitabine-Tenofovir Af Spoke with patient's husband.  Patient requested Pickup at [facility] at [place] date: 02/14/23   Medication will be filled on 02/12/23.

## 2023-02-12 ENCOUNTER — Other Ambulatory Visit: Payer: Self-pay

## 2023-02-15 ENCOUNTER — Other Ambulatory Visit (HOSPITAL_COMMUNITY): Payer: Self-pay

## 2023-02-17 ENCOUNTER — Other Ambulatory Visit: Payer: Self-pay

## 2023-03-04 ENCOUNTER — Other Ambulatory Visit: Payer: Self-pay

## 2023-03-04 NOTE — Progress Notes (Signed)
Specialty Pharmacy Refill Coordination Note  James Barry is a 67 y.o. male contacted today regarding refills of specialty medication(s) Emtricitabine-Tenofovir AF (DESCOVY)   Patient requested [name] at [facility] at [place] date: 03/14/23   Medication will be filled on 03/14/23.

## 2023-03-14 ENCOUNTER — Other Ambulatory Visit: Payer: Self-pay

## 2023-03-26 ENCOUNTER — Other Ambulatory Visit (HOSPITAL_COMMUNITY): Payer: Self-pay

## 2023-03-27 ENCOUNTER — Other Ambulatory Visit (HOSPITAL_COMMUNITY): Payer: Self-pay

## 2023-04-01 ENCOUNTER — Other Ambulatory Visit: Payer: Self-pay

## 2023-04-07 ENCOUNTER — Other Ambulatory Visit: Payer: Self-pay

## 2023-04-07 NOTE — Progress Notes (Signed)
Specialty Pharmacy Refill Coordination Note  James Barry is a 68 y.o. male contacted today regarding refills of specialty medication(s) Emtricitabine-Tenofovir AF (DESCOVY)   Patient requested [name] at [facility] at [place] date: 04/17/23   Medication will be filled on 04/16/23.

## 2023-04-11 ENCOUNTER — Other Ambulatory Visit (HOSPITAL_COMMUNITY): Payer: Self-pay

## 2023-04-14 ENCOUNTER — Encounter: Payer: Self-pay | Admitting: Gastroenterology

## 2023-04-15 ENCOUNTER — Other Ambulatory Visit (HOSPITAL_COMMUNITY): Payer: Self-pay

## 2023-04-16 ENCOUNTER — Other Ambulatory Visit: Payer: Self-pay

## 2023-04-17 ENCOUNTER — Other Ambulatory Visit: Payer: Self-pay

## 2023-04-17 ENCOUNTER — Encounter: Payer: Self-pay | Admitting: Family

## 2023-04-17 ENCOUNTER — Ambulatory Visit: Payer: Commercial Managed Care - PPO | Admitting: Family

## 2023-04-17 ENCOUNTER — Other Ambulatory Visit (HOSPITAL_COMMUNITY)
Admission: RE | Admit: 2023-04-17 | Discharge: 2023-04-17 | Disposition: A | Discharge status: Home / Self Care | Payer: Commercial Managed Care - PPO | Source: Ambulatory Visit | Attending: Family | Admitting: Family

## 2023-04-17 VITALS — BP 133/89 | HR 87 | Temp 97.4°F | Wt 158.0 lb

## 2023-04-17 DIAGNOSIS — Z79899 Other long term (current) drug therapy: Secondary | ICD-10-CM

## 2023-04-17 MED ORDER — EMTRICITABINE-TENOFOVIR AF 200-25 MG PO TABS
1.0000 | ORAL_TABLET | Freq: Every day | ORAL | 3 refills | Status: DC
  Filled 2023-04-17 – 2023-05-06 (×2): qty 30, 30d supply, fill #0
  Filled 2023-06-06: qty 30, 30d supply, fill #1

## 2023-04-17 NOTE — Patient Instructions (Signed)
Nice to see you.  We will check your lab work today.  Continue to take your medication daily as prescribed.  Refills have been sent to the pharmacy.  Plan for follow up in 3 months or sooner if needed with lab work on the same day.  Have a great day and stay safe!

## 2023-04-17 NOTE — Assessment & Plan Note (Signed)
Brett Canales continues to have good adherence and tolerance to Descovy. Reviewed previous lab work and discussed plan of care. Check lab work. Condoms and site specific STD testing offered. Continue current dose of Descovy. Plan for follow up in 3 months or sooner if needed with lab work on the same day.

## 2023-04-17 NOTE — Progress Notes (Signed)
Subjective:    Patient ID: James Barry, male    DOB: [birth year], 68 y.o.   MRN: [id number]  Chief Complaint  Patient presents with   Follow-up    PrEP    HPI:  James Barry is a 68 y.o. male on preexposure prophylaxis for HIV last seen on 01/15/2023 with good adherence and tolerance to Descovy.  HIV and STD testing were negative.  Here today for follow-up.  James Barry has been doing well since his last office visit and continues to take Descovy as prescribed with no adverse side effects or problems obtaining medication. No new concerns/complaints. Condoms and site specific STD testing offered. Has questions about DoxyPEP.   Denies fevers, chills, night sweats, headaches, changes in vision, neck pain/stiffness, nausea, diarrhea, vomiting, lesions or rashes.   Allergies  Allergen Reactions   Buspirone     Unaware    Cephalexin     Unaware       Outpatient Medications Prior to Visit  Medication Sig Dispense Refill   acetaminophen (TYLENOL) 650 MG CR tablet Take 1,300 mg by mouth every morning.     aspirin EC 81 MG tablet Take 1 tablet (81 mg total) by mouth daily. Swallow whole. 90 tablet 3   docusate sodium (COLACE) 100 MG capsule Take 1 capsule (100 mg total) by mouth 2 (two) times daily.     esomeprazole (NEXIUM) 40 MG capsule Take 1 capsule  by mouth daily for heartburn. 90 capsule 3   neomycin-polymyxin-dexamethasone (MAXITROL) 0.1 % ophthalmic suspension Instill 1 drop into both eyes four times a day 5 mL 0   rosuvastatin (CRESTOR) 10 MG tablet Take 1 tablet (10 mg total) by mouth daily. 90 tablet 3   sildenafil (VIAGRA) 100 MG tablet Take 1 tablet by mouth 30 - 60 minutes prior to sexual activity as needed. 90 tablet 1   emtricitabine-tenofovir AF (DESCOVY) 200-25 MG tablet TAKE 1 TABLET BY MOUTH DAILY. 30 tablet 3   No facility-administered medications prior to visit.     Past Medical History:  Diagnosis Date   Acute neck pain 08/19/2019   Acute thoracic back  pain 07/30/2019   Arthritis    Cancer (HCC)    prostate   Clavicular asymmetry 07/30/2019   Diverticulosis    Elevated PSA 04/20/2020   Erectile dysfunction    GERD (gastroesophageal reflux disease)    Hyperlipidemia    Vertigo      Past Surgical History:  Procedure Laterality Date   COLONOSCOPY  03/19/2011   cleared for 5 yrs- Dr @ [facility] [employer])   HERNIA REPAIR     LYMPHADENECTOMY Bilateral 12/04/2020   Procedure: LYMPHADENECTOMY, PELVIC;  Surgeon: Heloise Purpura, MD;  Location: [facility];  Service: Urology;  Laterality: Bilateral;   ROBOT ASSISTED LAPAROSCOPIC RADICAL PROSTATECTOMY N/A 12/04/2020   Procedure: XI ROBOTIC ASSISTED LAPAROSCOPIC RADICAL PROSTATECTOMY LEVEL 2;  Surgeon: Heloise Purpura, MD;  Location: [facility];  Service: Urology;  Laterality: N/A;   UPPER GASTROINTESTINAL ENDOSCOPY         Review of Systems  Constitutional:  Negative for appetite change, chills, fatigue, fever and unexpected weight change.  Eyes:  Negative for visual disturbance.  Respiratory:  Negative for cough, chest tightness, shortness of breath and wheezing.   Cardiovascular:  Negative for chest pain and leg swelling.  Gastrointestinal:  Negative for abdominal pain, constipation, diarrhea, nausea and vomiting.  Genitourinary:  Negative for dysuria, flank pain, frequency, genital sores, hematuria and urgency.  Skin:  Negative for  rash.  Allergic/Immunologic: Negative for immunocompromised state.  Neurological:  Negative for dizziness and headaches.      Objective:    BP 133/89   Pulse 87   Temp (!) 97.4 F (36.3 C) (Oral)   Wt 158 lb (71.7 kg)   SpO2 96%   BMI 24.75 kg/m  Nursing note and vital signs reviewed.  Physical Exam Constitutional:      General: He is not in acute distress.    Appearance: He is well-developed.  Eyes:     Conjunctiva/sclera: Conjunctivae normal.  Cardiovascular:     Rate and Rhythm: Normal rate and regular rhythm.     Heart sounds: Normal  heart sounds. No murmur heard.    No friction rub. No gallop.  Pulmonary:     Effort: Pulmonary effort is normal. No respiratory distress.     Breath sounds: Normal breath sounds. No wheezing or rales.  Chest:     Chest wall: No tenderness.  Abdominal:     General: Bowel sounds are normal.     Palpations: Abdomen is soft.     Tenderness: There is no abdominal tenderness.  Musculoskeletal:     Cervical back: Neck supple.  Lymphadenopathy:     Cervical: No cervical adenopathy.  Skin:    General: Skin is warm and dry.     Findings: No rash.  Neurological:     Mental Status: He is alert and oriented to person, place, and time.  Psychiatric:        Behavior: Behavior normal.        Thought Content: Thought content normal.        Judgment: Judgment normal.         01/15/2023    2:28 PM 10/07/2022    1:49 PM 09/24/2022   10:36 AM 08/19/2022   11:13 AM 07/05/2022   10:45 AM  Depression screen PHQ 2/9  Decreased Interest 0 0 0 0 0  Down, Depressed, Hopeless 0 0 0 0 0  PHQ - 2 Score 0 0 0 0 0  Altered sleeping  0 0 0   Tired, decreased energy  0 0 2   Change in appetite  0 0 0   Feeling bad or failure about yourself   0 0 0   Trouble concentrating  0 0 0   Moving slowly or fidgety/restless  0 0 0   Suicidal thoughts  0 0 0   PHQ-9 Score  0 0 2   Difficult doing work/chores   Not difficult at all Not difficult at all        Assessment & Plan:    Patient Active Problem List   Diagnosis Date Noted   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   Acute foot pain, right 09/24/2022   CAD (coronary artery disease) 09/20/2021   Therapeutic drug monitoring 09/05/2021   Precordial chest pain 07/19/2021   Elevated blood pressure reading 07/19/2021   History of prostate cancer 12/04/2020   Malignant neoplasm of prostate (HCC) 10/18/2020   History of diverticulitis 08/29/2020   High risk homosexual behavior 09/23/2018   Preventative health care 08/24/2018   On pre-exposure  prophylaxis for HIV 12/17/2017   Chronic left shoulder pain 08/13/2017   Nocturia 08/13/2017   Exposure to HIV 06/06/2017   Erectile dysfunction 06/28/2015   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 Reviewed previous lab work and discussed plan of care. Check lab work. Condoms and site specific STD testing offered. Continue current dose of Descovy. Plan for follow up in 3 months or sooner if needed with lab work on the same day.       Relevant Medications   emtricitabine-tenofovir AF (DESCOVY) 200-25 MG tablet   Other Relevant Orders   HIV Antibody (routine testing w rflx)   RPR   Urine cytology ancillary only   Cytology (oral, anal, urethral) ancillary only   Cytology (oral, anal, urethral) ancillary only     I am having Tawana Scale "James Barry" maintain his acetaminophen, docusate sodium, aspirin EC, sildenafil, esomeprazole, neomycin-polymyxin-dexamethasone, rosuvastatin, and emtricitabine-tenofovir AF.   Meds ordered this encounter  Medications   emtricitabine-tenofovir AF (DESCOVY) 200-25 MG tablet    Sig: TAKE 1 TABLET BY MOUTH DAILY.    Dispense:  30 tablet    Refill:  3    Supervising Provider:   Judyann Munson [phone]    Prescription Type::   Renewal     Follow-up: Return in about 3 months (around 07/16/2023). or sooner if needed.   Marcos Eke, MSN, FNP-C Nurse Practitioner [facility] for Infectious Disease [facility] Main number: [phone]

## 2023-04-18 ENCOUNTER — Other Ambulatory Visit: Payer: Self-pay

## 2023-04-18 LAB — RPR: RPR Ser Ql: NONREACTIVE

## 2023-04-18 LAB — HIV ANTIBODY (ROUTINE TESTING W REFLEX): HIV 1&2 Ab, 4th Generation: NONREACTIVE

## 2023-04-21 ENCOUNTER — Other Ambulatory Visit: Payer: Self-pay | Admitting: Primary Care

## 2023-04-21 ENCOUNTER — Other Ambulatory Visit (HOSPITAL_COMMUNITY): Payer: Self-pay

## 2023-04-21 DIAGNOSIS — N529 Male erectile dysfunction, unspecified: Secondary | ICD-10-CM

## 2023-04-21 LAB — URINE CYTOLOGY ANCILLARY ONLY
Chlamydia: NEGATIVE
Comment: NEGATIVE
Comment: NORMAL
Neisseria Gonorrhea: NEGATIVE

## 2023-04-21 LAB — CYTOLOGY, (ORAL, ANAL, URETHRAL) ANCILLARY ONLY
Chlamydia: NEGATIVE
Chlamydia: NEGATIVE
Comment: NEGATIVE
Comment: NEGATIVE
Comment: NORMAL
Comment: NORMAL
Neisseria Gonorrhea: NEGATIVE
Neisseria Gonorrhea: NEGATIVE

## 2023-04-21 MED ORDER — SILDENAFIL CITRATE 100 MG PO TABS
ORAL_TABLET | ORAL | 1 refills | Status: AC
  Filled 2023-04-21: qty 18, 90d supply, fill #0
  Filled 2023-06-24: qty 90, 90d supply, fill #1

## 2023-04-22 ENCOUNTER — Other Ambulatory Visit: Payer: Self-pay

## 2023-04-22 ENCOUNTER — Other Ambulatory Visit (HOSPITAL_COMMUNITY): Payer: Self-pay

## 2023-04-24 DIAGNOSIS — C61 Malignant neoplasm of prostate: Secondary | ICD-10-CM | POA: Diagnosis not present

## 2023-05-06 ENCOUNTER — Other Ambulatory Visit: Payer: Self-pay

## 2023-05-06 NOTE — Progress Notes (Signed)
Specialty Pharmacy Refill Coordination Note  James Barry is a 68 y.o. male contacted today regarding refills of specialty medication(s) Emtricitabine-Tenofovir AF (DESCOVY)   Patient requested [name] at [facility] at [place] date: 05/19/23   Medication will be filled on 05/16/23.

## 2023-05-14 IMAGING — CT CT HEART MORP W/ CTA COR W/ SCORE W/ CA W/CM &/OR W/O CM
4 of 7 series · 8 of 20 positions shown, 9 images · IV contrast (APPLIED)
Comparison: None available

Addendum:
CLINICAL DATA: 65 yo male with chest pain

EXAM:
Cardiac/Coronary CTA
TECHNIQUE: A non-contrast, gated CT scan was obtained with axial slices of 3 mm
through the heart for calcium scoring. Calcium scoring was performed
using the Agatston method. A 120 kV prospective, gated, contrast
cardiac scan was obtained. Gantry rotation speed was 250 msecs and
collimation was 0.6 mm. Two sublingual nitroglycerin tablets (0.8
mg) were given. The 3D data set was reconstructed in 5% intervals of
the 35-75% of the R-R cycle. Diastolic phases were analyzed on a
dedicated workstation using MPR, MIP, and VRT modes. The patient
received 95 cc of contrast.

[Series 6: ts diast sharp · axial · 0.39mm/px · z∈[+1217,+1253]mm · 2 of 271 slices shown]
[im 91/271  lung]
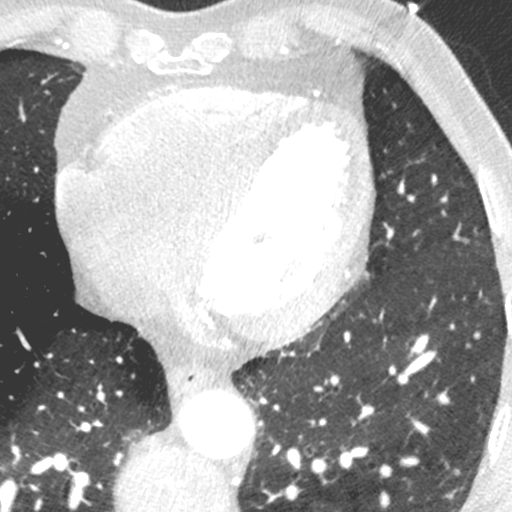
[im 181/271  lung]
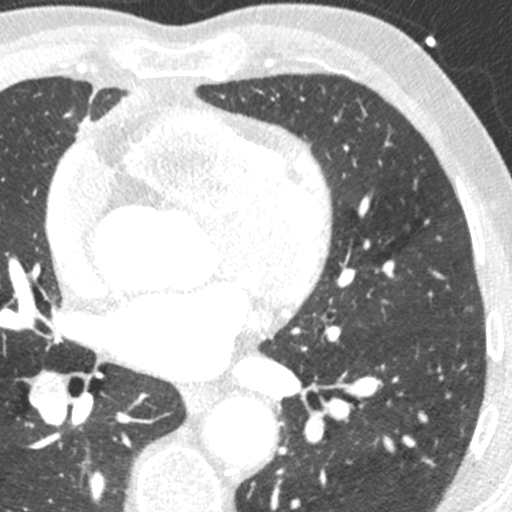

[Series 7: ts syst sharp · axial · 0.39mm/px · z∈[+1217,+1253]mm · 2 of 271 slices shown]
[im 91/271  lung]
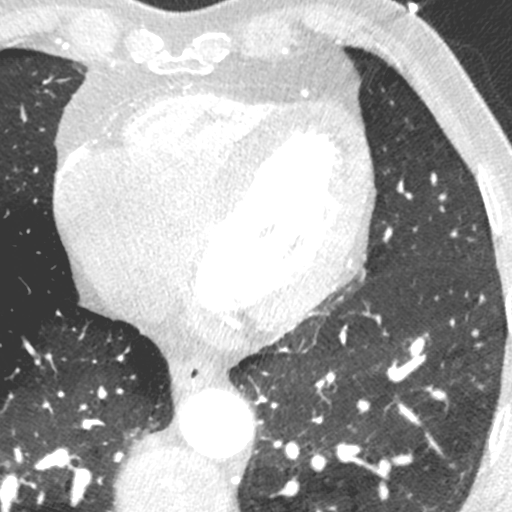
[im 181/271  lung]
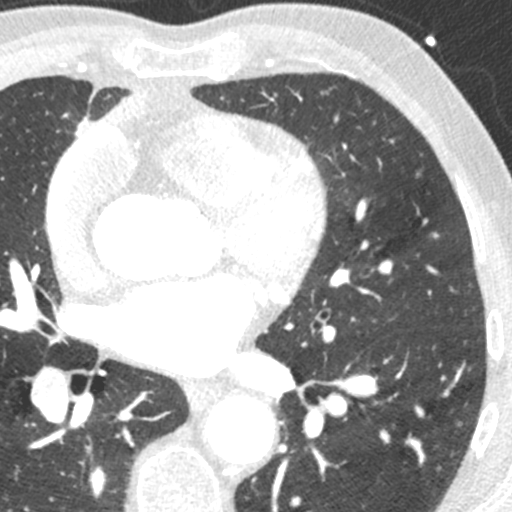

[Series 8: best syst · axial · 0.39mm/px · z∈[+1217,+1253]mm · 2 of 271 slices shown, 3 images]
[im 91/271  vessel]
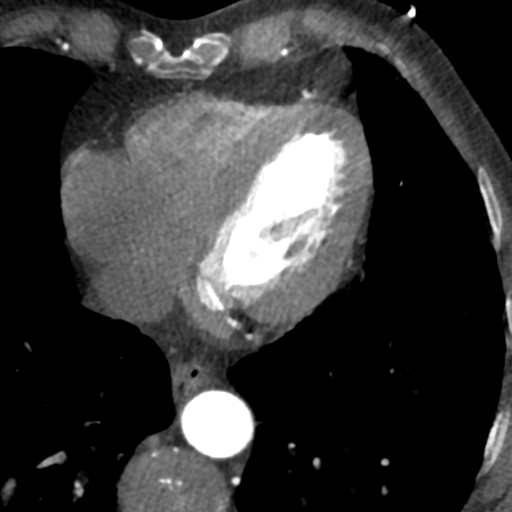
[im 91/271  lung]
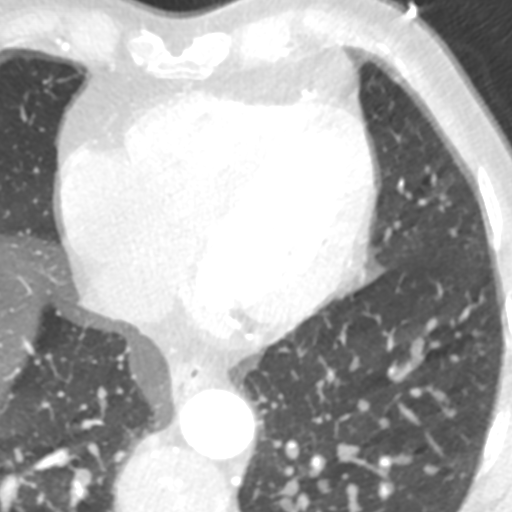
[im 181/271  vessel]
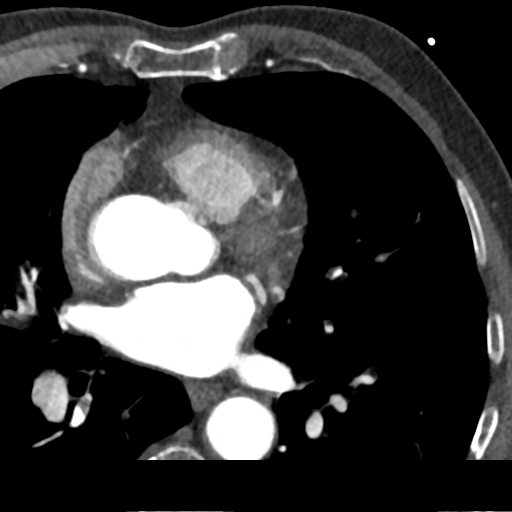

[Series 9: best diast · axial · 0.39mm/px · z∈[+1217,+1253]mm · 2 of 271 slices shown]
[im 91/271  vessel]
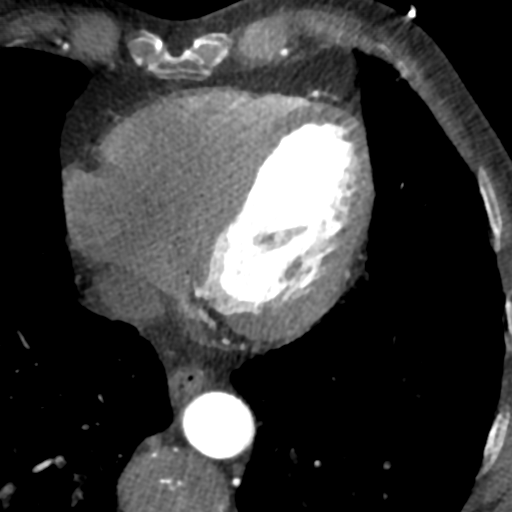
[im 181/271  vessel]
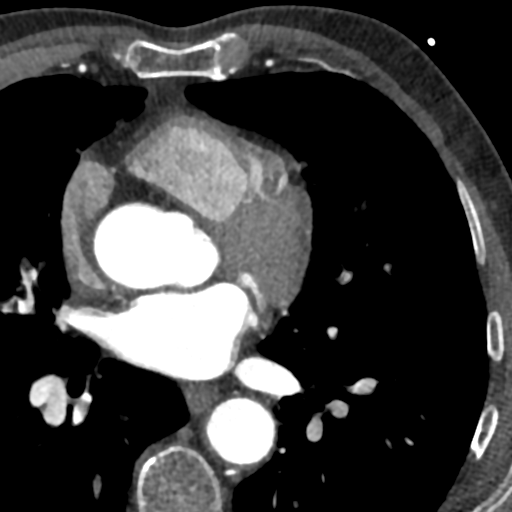

[8 of 20 positions shown; findings below may reference images not displayed]

FINDINGS: Image quality: Average.

Noise artifact is: Severe. (signal-to-noise (mis-registration in
LAD, Lcx and OM).

Coronary Arteries:  Normal coronary origin.  Left dominance.

Left main: The left main is a large caliber vessel with a normal
take off from the left coronary cusp that bifurcates to form a left
anterior descending artery and a left circumflex artery. There is no
plaque or stenosis.

Left anterior descending artery: The LAD has mild (25-49) calcified
plaque in the proxmial and proximal/mid vessel. The LAD gives off 2
small diagonal branches.

Left circumflex artery: The LCX is dominant and has mild (25-49)
mixed plaque stenosis in the proximal vessel. The LCX gives off
small OM1, large branching OM2 (minimal 0-24 plaque), large
posterolateral and PDA.

Right coronary artery: The RCA is non-dominant with normal take off
from the right coronary cusp. There is no evidence of plaque or
stenosis.

Right Atrium: Right atrial size is within normal limits.

Right Ventricle: The right ventricular cavity is within normal
limits.

Left Atrium: Left atrial size is normal in size with no left atrial
appendage filling defect.

Left Ventricle: The ventricular cavity size is within normal limits.
There are no stigmata of prior infarction. There is no abnormal
filling defect.

Pulmonary arteries: Normal in size without proximal filling defect.

Pulmonary veins: Normal pulmonary venous drainage.

Pericardium: Normal thickness with no significant effusion or
calcium present.

Cardiac valves: The aortic valve is trileaflet without significant
calcification. The mitral valve is normal structure without
significant calcification.

Aorta: Aorta upper limit of normal (3.8 cm); aortic atherosclerosis.

Extra-cardiac findings: See attached radiology report for
non-cardiac structures.
IMPRESSION: 1. Coronary calcium score of 461. This was 81 percentile for age-,
sex, and race-matched controls.

2. Normal coronary origin with left dominance.

3. Mild (25-49) CAD in the proximal LAD and LCx.

4. Aortic atherosclerosis.

5. Suboptimal study due to significant misregistration.

RECOMMENDATIONS:
CAD-RADS 2: Mild non-obstructive CAD (25-49%). Consider
non-atherosclerotic causes of chest pain. Consider preventive
therapy and risk factor modification.

EXAM:
OVER-READ INTERPRETATION  CT CHEST

The following report is an over-read performed by radiologist Dr.
Dandre Alas [[email or web address]] on 08/23/2021. This over-read
does not include interpretation of cardiac or coronary anatomy or
pathology. The coronary calcium score/coronary CTA interpretation by
the cardiologist is attached.
FINDINGS: Cardiovascular: The ascending aorta measures up to 3.8 cm in caliber
dilated but not aneurysmal. Bone arteries are partially visualized.
No large central pulmonary embolism is seen.

Mediastinum/Nodes: There are no enlarged lymph nodes within the
visualized mediastinum.

Lungs/Pleura: Mild bilateral dependent subpleural ground-glass
subsegmental atelectasis. No pleural effusion.

Upper abdomen: No significant findings in the visualized upper
abdomen.

Musculoskeletal/Chest wall: No chest wall mass or suspicious osseous
findings within the visualized chest.
IMPRESSION: No significant extracardiac findings within the visualized chest.

*** End of Addendum ***
FINDINGS: Image quality: Average.

Noise artifact is: Severe. (signal-to-noise (mis-registration in
LAD, Lcx and OM).

Coronary Arteries:  Normal coronary origin.  Left dominance.

Left main: The left main is a large caliber vessel with a normal
take off from the left coronary cusp that bifurcates to form a left
anterior descending artery and a left circumflex artery. There is no
plaque or stenosis.

Left anterior descending artery: The LAD has mild (25-49) calcified
plaque in the proxmial and proximal/mid vessel. The LAD gives off 2
small diagonal branches.

Left circumflex artery: The LCX is dominant and has mild (25-49)
mixed plaque stenosis in the proximal vessel. The LCX gives off
small OM1, large branching OM2 (minimal 0-24 plaque), large
posterolateral and PDA.

Right coronary artery: The RCA is non-dominant with normal take off
from the right coronary cusp. There is no evidence of plaque or
stenosis.

Right Atrium: Right atrial size is within normal limits.

Right Ventricle: The right ventricular cavity is within normal
limits.

Left Atrium: Left atrial size is normal in size with no left atrial
appendage filling defect.

Left Ventricle: The ventricular cavity size is within normal limits.
There are no stigmata of prior infarction. There is no abnormal
filling defect.

Pulmonary arteries: Normal in size without proximal filling defect.

Pulmonary veins: Normal pulmonary venous drainage.

Pericardium: Normal thickness with no significant effusion or
calcium present.

Cardiac valves: The aortic valve is trileaflet without significant
calcification. The mitral valve is normal structure without
significant calcification.

Aorta: Aorta upper limit of normal (3.8 cm); aortic atherosclerosis.

Extra-cardiac findings: See attached radiology report for
non-cardiac structures.
IMPRESSION: 1. Coronary calcium score of 461. This was 81 percentile for age-,
sex, and race-matched controls.

2. Normal coronary origin with left dominance.

3. Mild (25-49) CAD in the proximal LAD and LCx.

4. Aortic atherosclerosis.

5. Suboptimal study due to significant misregistration.

RECOMMENDATIONS:
CAD-RADS 2: Mild non-obstructive CAD (25-49%). Consider
non-atherosclerotic causes of chest pain. Consider preventive
therapy and risk factor modification.

## 2023-05-16 ENCOUNTER — Other Ambulatory Visit: Payer: Self-pay

## 2023-06-06 ENCOUNTER — Other Ambulatory Visit: Payer: Self-pay

## 2023-06-06 NOTE — Progress Notes (Signed)
 Specialty Pharmacy Refill Coordination Note  ORLAND VISCONTI is a 68 y.o. male contacted today regarding refills of specialty medication(s) Emtricitabine-Tenofovir AF (DESCOVY)   Patient requested (Patient-Rptd) Pickup at [facility] at [facility] date: (Patient-Rptd) 06/16/23   Medication will be filled on 06/13/23.

## 2023-06-10 DIAGNOSIS — H524 Presbyopia: Secondary | ICD-10-CM | POA: Diagnosis not present

## 2023-06-12 DIAGNOSIS — L570 Actinic keratosis: Secondary | ICD-10-CM | POA: Diagnosis not present

## 2023-06-12 DIAGNOSIS — L821 Other seborrheic keratosis: Secondary | ICD-10-CM | POA: Diagnosis not present

## 2023-06-13 ENCOUNTER — Other Ambulatory Visit: Payer: Self-pay

## 2023-06-17 ENCOUNTER — Other Ambulatory Visit (HOSPITAL_COMMUNITY): Payer: Self-pay

## 2023-06-17 DIAGNOSIS — N393 Stress incontinence (female) (male): Secondary | ICD-10-CM | POA: Diagnosis not present

## 2023-06-17 DIAGNOSIS — C61 Malignant neoplasm of prostate: Secondary | ICD-10-CM | POA: Diagnosis not present

## 2023-06-17 DIAGNOSIS — N5201 Erectile dysfunction due to arterial insufficiency: Secondary | ICD-10-CM | POA: Diagnosis not present

## 2023-06-17 MED ORDER — SILDENAFIL CITRATE 100 MG PO TABS
100.0000 mg | ORAL_TABLET | ORAL | 11 refills | Status: AC
  Filled 2023-06-17: qty 30, 30d supply, fill #0
  Filled 2023-06-23: qty 90, 90d supply, fill #0
  Filled 2023-09-21: qty 30, 30d supply, fill #0
  Filled 2024-04-22: qty 90, 90d supply, fill #1
  Filled ????-??-??: fill #0

## 2023-06-23 ENCOUNTER — Other Ambulatory Visit (HOSPITAL_COMMUNITY): Payer: Self-pay

## 2023-06-24 ENCOUNTER — Other Ambulatory Visit (HOSPITAL_COMMUNITY): Payer: Self-pay

## 2023-06-25 NOTE — Progress Notes (Unsigned)
 HPI :  68 year old male with a history of colon polyps, GERD, here for a follow-up visit.  Recall I last saw him for screening colonoscopy in February 2024.  He had a diminutive adenoma removed from the ascending colon and diverticulosis, particularly severe in the left colon.  There was some altered mucosa at the appendiceal orifice, there was nodular/protuberant tissue that did not appear overtly adenomatous and seemed postinflammatory, obliterated the AO.  Biopsies were taken which showed "nonspecific acute inflammation" negative for granulomas or features of chronicity or dysplasia.  We subsequently obtained a CT scan which showed that the appendix was normal but showed some possible soft tissue protuberance at the orifice.  We had discussed at the time if he wanted to see a surgeon to discuss empiric appendectomy or monitor.  He elected observation.  He has no abdominal pains that bother him.  No blood in his stools.  No bowel symptoms that bother him.  He has a good appetite, no weight loss, feels well without complaints.  He does have ongoing reflux which he has had for years.  He had an EGD in 2014 with Dr. Evette Cristal at [facility] which showed no evidence of Barrett's.  He currently takes Nexium 40 mg every morning and Pepcid later in the day.  On this regimen he states it works really well and he does not have frequent breakthrough.  If he does have rare breakthrough he may drink some Pepto-Bismol which helps, down.  He remembers that previously when he tried a lower dose of Nexium it did not work well for him at all and he had significant breakthrough of his reflux on the regimen.  He denies any dysphagia.  Denies any history of kidney disease etc.    Colonoscopy 05/02/22: - The perianal and digital rectal examinations were normal. - The terminal ileum appeared normal. - Altered mucosa was found at the appendiceal orifice. The mucosal was nodular / protuberant, did not appear adenomatous but seemed  post inflammatory, it obliterated the orifice. Biopsies were taken with a cold forceps for histology. - A diminutive polyp was found in the ascending colon. The polyp was sessile. The polyp was removed with a cold biopsy forceps. Resection and retrieval were complete. - Many small-mouthed diverticula were found in the transverse colon and left colon. Superficial erythema noted in the sigmoid colon in multiple areas around diverticulosis. Severe diverticulosis in the left colon. - Internal hemorrhoids were found during retroflexion. The hemorrhoids were small. - The exam was otherwise without abnormality.    1. Surgical [P], colon, appendiceal orifice - COLONIC MUCOSA WITH NONSPECIFIC ACUTE INFLAMMATION - NEGATIVE FOR GRANULOMAS, FEATURES OF CHRONICITY OR DYSPLASIA 2. Surgical [P], colon, ascending, polyp (1) - TUBULAR ADENOMA - NEGATIVE FOR HIGH-GRADE DYSPLASIA OR MALIGNANCY   CT abdomen / pelvis 05/2022: IMPRESSION: 1. Possible mild soft tissue prominence at the appendiceal orifice. Appendix is otherwise unremarkable. 2. Hepatic steatosis. 3. Cholelithiasis.  Past Medical History:  Diagnosis Date   Acute neck pain 08/19/2019   Acute thoracic back pain 07/30/2019   Arthritis    Cancer (HCC)    prostate   Clavicular asymmetry 07/30/2019   Diverticulosis    Elevated PSA 04/20/2020   Erectile dysfunction    GERD (gastroesophageal reflux disease)    Hyperlipidemia    Vertigo      Past Surgical History:  Procedure Laterality Date   COLONOSCOPY  03/19/2011   cleared for 5 yrs- Dr @ [facility] [employer])   HERNIA REPAIR  LYMPHADENECTOMY Bilateral 12/04/2020   Procedure: LYMPHADENECTOMY, PELVIC;  Surgeon: Heloise Purpura, MD;  Location: [facility];  Service: Urology;  Laterality: Bilateral;   ROBOT ASSISTED LAPAROSCOPIC RADICAL PROSTATECTOMY N/A 12/04/2020   Procedure: XI ROBOTIC ASSISTED LAPAROSCOPIC RADICAL PROSTATECTOMY LEVEL 2;  Surgeon: Heloise Purpura, MD;  Location: [facility];   Service: Urology;  Laterality: N/A;   UPPER GASTROINTESTINAL ENDOSCOPY     Family History  Problem Relation Age of Onset   Breast cancer Mother    Lung cancer Father    Heart disease Father    Hypertension Father    Arthritis Maternal Grandmother    Colon cancer Neg Hx    Colon polyps Neg Hx    Esophageal cancer Neg Hx    Rectal cancer Neg Hx    Stomach cancer Neg Hx    Social History   Tobacco Use   Smoking status: Never   Smokeless tobacco: Never  Vaping Use   Vaping status: Never Used  Substance Use Topics   Alcohol use: Yes    Alcohol/week: 0.0 standard drinks of alcohol    Comment: social   Drug use: No   Current Outpatient Medications  Medication Sig Dispense Refill   acetaminophen (TYLENOL) 650 MG CR tablet Take 1,300 mg by mouth every morning.     aspirin EC 81 MG tablet Take 1 tablet (81 mg total) by mouth daily. Swallow whole. 90 tablet 3   docusate sodium (COLACE) 100 MG capsule Take 1 capsule (100 mg total) by mouth 2 (two) times daily.     emtricitabine-tenofovir AF (DESCOVY) 200-25 MG tablet TAKE 1 TABLET BY MOUTH DAILY. 30 tablet 3   esomeprazole (NEXIUM) 40 MG capsule Take 1 capsule  by mouth daily for heartburn. 90 capsule 3   neomycin-polymyxin-dexamethasone (MAXITROL) 0.1 % ophthalmic suspension Instill 1 drop into both eyes four times a day 5 mL 0   rosuvastatin (CRESTOR) 10 MG tablet Take 1 tablet (10 mg total) by mouth daily. 90 tablet 3   sildenafil (VIAGRA) 100 MG tablet Take 1 tablet by mouth 30 - 60 minutes prior to sexual activity as needed. 90 tablet 1   sildenafil (VIAGRA) 100 MG tablet Take 1 tablet (100 mg total) by mouth when needed as directed. 30 tablet 11   No current facility-administered medications for this visit.   Allergies  Allergen Reactions   Buspirone     Unaware    Cephalexin     Unaware      Review of Systems: All systems reviewed and negative except where noted in HPI.    Physical Exam: BP 120/84 (BP Location:  Left Arm, Patient Position: Sitting, Cuff Size: Normal)   Pulse 72   Ht 5\' 7"  (1.702 m)   Wt 159 lb 2 oz (72.2 kg)   SpO2 98%   BMI 24.92 kg/m  Constitutional: Pleasant,well-developed, male in no acute distress. Neurological: Alert and oriented to person place and time. Psychiatric: Normal mood and affect. Behavior is normal.   ASSESSMENT: 68 y.o. male here for assessment of the following  1. Abnormal colonoscopy   2. Abnormal CT scan, gastrointestinal tract   3. Gastroesophageal reflux disease, unspecified whether esophagitis present   4. Long-term current use of proton pump inhibitor therapy    As above, abnormal colonoscopy with nodular protuberant tissue at the AO which obliterates the lumen of the AO.  Numerous biopsies obtained from that area show no dysplastic change, this did not appear adenomatous, I do not think it is a polyp.  It appears to be postinflammatory/reactive, I wonder if he had mild appendicitis in the past which led to this.  We did do a follow-up CT scan after the colonoscopy and the appendix itself appears normal with some prominent soft tissue at the A-O, correlating with colonoscopy findings.  We discussed long-term plan.  The only way to know truly what this is is to remove it with appendectomy/base of cecum.  He is asymptomatic, has no problems that bother him.  This would seem to be a rather aggressive option for what is more than likely benign disease but I offered it to him and he is declining that at this time.  In that setting, I do recommend some interval follow-up in regards to either colonoscopy or CT scan.  I think colonoscopy would be more accurate given the finding is intraluminal to assess for interval enlargement and ensure no dysplastic tissue.  We discussed when to do that, I offered it to him now versus later in the year, depending on his preference.  He would like to wait till September timeframe to schedule the colonoscopy but will contact me in  the interim if he has any issues that bother him otherwise.  In the interim I recommend we check a CBC now just to make sure no anemia that has developed over time.  If he has developed iron deficiency we should do the colonoscopy now.  After good discussion of these issues, he understands his options and elects for surveillance colonoscopy later in the year, after the summer.  Otherwise we discussed his reflux.  Stable symptoms over time, controlled on his current regimen.  He has a prior EGD that showed no evidence of Barrett's.  I discussed long-term risk of chronic PPI use, want to use the lowest dose daily needed to control symptoms.  He has failed lower dosing of Nexium in the past and in that light would like to continue his current dosing of 40 mg once daily.  He will continue Pepcid as needed if that helps him as well as as needed antacid.  No dysphagia or alarm symptoms.  PLAN: - CBC today - offered surgical opinion, he declines at this time, favors surveillance which I think is reasonable - schedule colonoscopy Sept time frame - continue nexium and pepcid for now - discussed long term risks of chronic PPI, long term use lowest dose needed to control symptoms, failed lower dose nexium  Harlin Rain, MD [facility]

## 2023-06-26 ENCOUNTER — Other Ambulatory Visit (INDEPENDENT_AMBULATORY_CARE_PROVIDER_SITE_OTHER)

## 2023-06-26 ENCOUNTER — Encounter: Payer: Self-pay | Admitting: Gastroenterology

## 2023-06-26 ENCOUNTER — Ambulatory Visit: Payer: Commercial Managed Care - PPO | Admitting: Gastroenterology

## 2023-06-26 ENCOUNTER — Other Ambulatory Visit (HOSPITAL_COMMUNITY): Payer: Self-pay

## 2023-06-26 VITALS — BP 120/84 | HR 72 | Ht 67.0 in | Wt 159.1 lb

## 2023-06-26 DIAGNOSIS — Z79899 Other long term (current) drug therapy: Secondary | ICD-10-CM

## 2023-06-26 DIAGNOSIS — K219 Gastro-esophageal reflux disease without esophagitis: Secondary | ICD-10-CM

## 2023-06-26 DIAGNOSIS — R933 Abnormal findings on diagnostic imaging of other parts of digestive tract: Secondary | ICD-10-CM

## 2023-06-26 LAB — CBC WITH DIFFERENTIAL/PLATELET
Basophils Absolute: 0.1 10*3/uL (ref 0.0–0.1)
Basophils Relative: 0.7 % (ref 0.0–3.0)
Eosinophils Absolute: 0.1 10*3/uL (ref 0.0–0.7)
Eosinophils Relative: 1 % (ref 0.0–5.0)
HCT: 41.2 % (ref 39.0–52.0)
Hemoglobin: 13.8 g/dL (ref 13.0–17.0)
Lymphocytes Relative: 27.2 % (ref 12.0–46.0)
Lymphs Abs: 2.2 10*3/uL (ref 0.7–4.0)
MCHC: 33.5 g/dL (ref 30.0–36.0)
MCV: 89.3 fl (ref 78.0–100.0)
Monocytes Absolute: 0.8 10*3/uL (ref 0.1–1.0)
Monocytes Relative: 9.7 % (ref 3.0–12.0)
Neutro Abs: 5 10*3/uL (ref 1.4–7.7)
Neutrophils Relative %: 61.4 % (ref 43.0–77.0)
Platelets: 235 10*3/uL (ref 150.0–400.0)
RBC: 4.61 Mil/uL (ref 4.22–5.81)
RDW: 14 % (ref 11.5–15.5)
WBC: 8.1 10*3/uL (ref 4.0–10.5)

## 2023-06-26 MED ORDER — ESOMEPRAZOLE MAGNESIUM 40 MG PO CPDR
40.0000 mg | DELAYED_RELEASE_CAPSULE | Freq: Every day | ORAL | 1 refills | Status: DC
  Filled 2023-06-26 – 2023-09-21 (×2): qty 90, 90d supply, fill #0
  Filled 2023-12-22: qty 90, 90d supply, fill #1

## 2023-06-26 NOTE — Patient Instructions (Signed)
 Please go to the lab in the basement of our building to have lab work done as you leave today. Hit "B" for basement when you get on the elevator.  When the doors open the lab is on your left.  We will call you with the results. Thank you.  You will be due for a recall colonoscopy in 11-2023. We will send you a reminder in the mail when it gets closer to that time.  We have sent the following medications to your pharmacy for you to pick up at your convenience: Nexium  Thank you for entrusting me with your care and for choosing [facility], Dr. Ileene Patrick   If your blood pressure at your visit was 140/90 or greater, please contact your primary care physician to follow up on this. ______________________________________________________  If you are age 68 or older, your body mass index should be between 23-30. Your Body mass index is 24.92 kg/m. If this is out of the aforementioned range listed, please consider follow up with your Primary Care Provider.  If you are age 40 or younger, your body mass index should be between 19-25. Your Body mass index is 24.92 kg/m. If this is out of the aformentioned range listed, please consider follow up with your Primary Care Provider.  ________________________________________________________  The [facility] providers would like to encourage you to use [facility] to communicate with providers for non-urgent requests or questions.  Due to long hold times on the telephone, sending your provider a message by [facility] may be a faster and more efficient way to get a response.  Please allow 48 business hours for a response.  Please remember that this is for non-urgent requests.  _______________________________________________________  Due to recent changes in healthcare laws, you may see the results of your imaging and laboratory studies on MyChart before your provider has had a chance to review them.  We understand that in some cases there may be results  that are confusing or concerning to you. Not all laboratory results come back in the same time frame and the provider may be waiting for multiple results in order to interpret others.  Please give [place] 48 hours in order for your provider to thoroughly review all the results before contacting the office for clarification of your results.

## 2023-07-03 ENCOUNTER — Ambulatory Visit: Payer: Commercial Managed Care - PPO | Admitting: Family

## 2023-07-03 ENCOUNTER — Encounter: Payer: Self-pay | Admitting: Family

## 2023-07-03 ENCOUNTER — Other Ambulatory Visit: Payer: Self-pay

## 2023-07-03 ENCOUNTER — Other Ambulatory Visit (HOSPITAL_COMMUNITY): Payer: Self-pay

## 2023-07-03 VITALS — BP 149/99 | HR 72 | Temp 97.9°F | Wt 159.0 lb

## 2023-07-03 DIAGNOSIS — Z79899 Other long term (current) drug therapy: Secondary | ICD-10-CM | POA: Diagnosis not present

## 2023-07-03 DIAGNOSIS — Z113 Encounter for screening for infections with a predominantly sexual mode of transmission: Secondary | ICD-10-CM | POA: Diagnosis not present

## 2023-07-03 MED ORDER — EMTRICITABINE-TENOFOVIR AF 200-25 MG PO TABS
1.0000 | ORAL_TABLET | Freq: Every day | ORAL | 3 refills | Status: DC
  Filled 2023-07-03 – 2023-07-14 (×2): qty 30, 30d supply, fill #0
  Filled 2023-08-07: qty 30, 30d supply, fill #1
  Filled 2023-09-05 – 2023-09-15 (×2): qty 30, 30d supply, fill #2

## 2023-07-03 NOTE — Patient Instructions (Signed)
 Nice to see you.  We will check your lab work today.  Continue to take your medication daily as prescribed.  Refills have been sent to the pharmacy.  Plan for follow up in 3 months or sooner if needed with lab work on the same day.  Have a great day and stay safe!

## 2023-07-03 NOTE — Assessment & Plan Note (Signed)
 James Barry continues to have good adherence and tolerance to Descovy with no adverse side effects. Reviewed lab work and discussed plan of care. Check lab work. Discussed importance of safe sexual practice and condom use. Condoms and site specific STD testing offered. Continue current dose of Descovy. Plan for follow up in 3 months or sooner if needed.

## 2023-07-03 NOTE — Progress Notes (Signed)
 Subjective:    Patient ID: James Barry, male    DOB: Jan 21, 1956, 68 y.o.   MRN: [id number]  Chief Complaint  Patient presents with   PrEP    HPI:  James Barry is a 68 y.o. male on preexposure prophylaxis for HIV last seen on 04/17/2023 with good adherence and tolerance to Descovy.  HIV and STD testing were negative.  Here today for routine follow-up.  James Barry has been doing well since his last office visit and continues to take Descovy as prescribed with no adverse side effects or problems obtaining medication from the pharmacy. Sexually active. Condoms and site specific STD testing offered.   Denies fevers, chills, night sweats, headaches, changes in vision, neck pain/stiffness, nausea, diarrhea, vomiting, lesions or rashes.    Allergies  Allergen Reactions   Buspirone     Unaware    Cephalexin     Unaware       Outpatient Medications Prior to Visit  Medication Sig Dispense Refill   acetaminophen (TYLENOL) 650 MG CR tablet Take 1,300 mg by mouth every morning.     aspirin EC 81 MG tablet Take 1 tablet (81 mg total) by mouth daily. Swallow whole. 90 tablet 3   docusate sodium (COLACE) 100 MG capsule Take 1 capsule (100 mg total) by mouth 2 (two) times daily.     esomeprazole (NEXIUM) 40 MG capsule Take 1 capsule (40 mg total) by mouth daily for heartburn. 90 capsule 1   neomycin-polymyxin-dexamethasone (MAXITROL) 0.1 % ophthalmic suspension Instill 1 drop into both eyes four times a day 5 mL 0   rosuvastatin (CRESTOR) 10 MG tablet Take 1 tablet (10 mg total) by mouth daily. 90 tablet 3   sildenafil (VIAGRA) 100 MG tablet Take 1 tablet by mouth 30 - 60 minutes prior to sexual activity as needed. 90 tablet 1   sildenafil (VIAGRA) 100 MG tablet Take 1 tablet (100 mg total) by mouth when needed as directed. 30 tablet 11   emtricitabine-tenofovir AF (DESCOVY) 200-25 MG tablet TAKE 1 TABLET BY MOUTH DAILY. 30 tablet 3   No facility-administered medications prior to visit.      Past Medical History:  Diagnosis Date   Acute neck pain 08/19/2019   Acute thoracic back pain 07/30/2019   Arthritis    Cancer (HCC)    prostate   Clavicular asymmetry 07/30/2019   Diverticulosis    Elevated PSA 04/20/2020   Erectile dysfunction    GERD (gastroesophageal reflux disease)    Hyperlipidemia    Vertigo      Past Surgical History:  Procedure Laterality Date   COLONOSCOPY  03/19/2011   cleared for 5 yrs- Dr @ [facility] [employer])   HERNIA REPAIR     LYMPHADENECTOMY Bilateral 12/04/2020   Procedure: LYMPHADENECTOMY, PELVIC;  Surgeon: Heloise Purpura, MD;  Location: [facility];  Service: Urology;  Laterality: Bilateral;   ROBOT ASSISTED LAPAROSCOPIC RADICAL PROSTATECTOMY N/A 12/04/2020   Procedure: XI ROBOTIC ASSISTED LAPAROSCOPIC RADICAL PROSTATECTOMY LEVEL 2;  Surgeon: Heloise Purpura, MD;  Location: [facility];  Service: Urology;  Laterality: N/A;   UPPER GASTROINTESTINAL ENDOSCOPY         Review of Systems  Constitutional:  Negative for appetite change, chills, fatigue, fever and unexpected weight change.  Eyes:  Negative for visual disturbance.  Respiratory:  Negative for cough, chest tightness, shortness of breath and wheezing.   Cardiovascular:  Negative for chest pain and leg swelling.  Gastrointestinal:  Negative for abdominal pain, constipation, diarrhea, nausea  and vomiting.  Genitourinary:  Negative for dysuria, flank pain, frequency, genital sores, hematuria and urgency.  Skin:  Negative for rash.  Allergic/Immunologic: Negative for immunocompromised state.  Neurological:  Negative for dizziness and headaches.      Objective:    BP (!) 149/99   Pulse 72   Temp 97.9 F (36.6 C) (Oral)   Wt 159 lb (72.1 kg)   SpO2 95%   BMI 24.90 kg/m  Nursing note and vital signs reviewed.  Physical Exam Constitutional:      General: He is not in acute distress.    Appearance: He is well-developed.  Eyes:     Conjunctiva/sclera: Conjunctivae normal.   Cardiovascular:     Rate and Rhythm: Normal rate and regular rhythm.     Heart sounds: Normal heart sounds. No murmur heard.    No friction rub. No gallop.  Pulmonary:     Effort: Pulmonary effort is normal. No respiratory distress.     Breath sounds: Normal breath sounds. No wheezing or rales.  Chest:     Chest wall: No tenderness.  Abdominal:     General: Bowel sounds are normal.     Palpations: Abdomen is soft.     Tenderness: There is no abdominal tenderness.  Musculoskeletal:     Cervical back: Neck supple.  Lymphadenopathy:     Cervical: No cervical adenopathy.  Skin:    General: Skin is warm and dry.     Findings: No rash.  Neurological:     Mental Status: He is alert and oriented to person, place, and time.  Psychiatric:        Behavior: Behavior normal.        Thought Content: Thought content normal.        Judgment: Judgment normal.         07/03/2023    3:10 PM 01/15/2023    2:28 PM 10/07/2022    1:49 PM 09/24/2022   10:36 AM 08/19/2022   11:13 AM  Depression screen PHQ 2/9  Decreased Interest 0 0 0 0 0  Down, Depressed, Hopeless 0 0 0 0 0  PHQ - 2 Score 0 0 0 0 0  Altered sleeping 0  0 0 0  Tired, decreased energy 0  0 0 2  Change in appetite 0  0 0 0  Feeling bad or failure about yourself  0  0 0 0  Trouble concentrating 0  0 0 0  Moving slowly or fidgety/restless 0  0 0 0  Suicidal thoughts 0  0 0 0  PHQ-9 Score 0  0 0 2  Difficult doing work/chores Not difficult at all   Not difficult at all Not difficult at all       Assessment & Plan:    Patient Active Problem List   Diagnosis Date Noted   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   Acute foot pain, right 09/24/2022   CAD (coronary artery disease) 09/20/2021   Therapeutic drug monitoring 09/05/2021   Precordial chest pain 07/19/2021   Elevated blood pressure reading 07/19/2021   History of prostate cancer 12/04/2020   Malignant neoplasm of prostate (HCC) 10/18/2020   History of  diverticulitis 08/29/2020   High risk homosexual behavior 09/23/2018   Preventative health care 08/24/2018   On pre-exposure prophylaxis for HIV 12/17/2017   Chronic left shoulder pain 08/13/2017   Nocturia 08/13/2017   Exposure to HIV 06/06/2017   Erectile dysfunction 06/28/2015   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 Reviewed lab work and discussed plan of care. Check lab work. Discussed importance of safe sexual practice and condom use. Condoms and site specific STD testing offered. Continue current dose of Descovy. Plan for follow up in 3 months or sooner if needed.       Relevant Medications   emtricitabine-tenofovir AF (DESCOVY) 200-25 MG tablet   Other Relevant Orders   COMPLETE METABOLIC PANEL WITHOUT GFR   HIV antibody (with reflex)   Other Visit Diagnoses       Screening for STDs (sexually transmitted diseases)       Relevant Orders   RPR   CT/NG RNA, TMA Rectal   GC/CT Probe, Amp (Throat)   C. trachomatis/N. gonorrhoeae RNA        I am having James Barry "James Barry" maintain his acetaminophen, docusate sodium, aspirin EC, neomycin-polymyxin-dexamethasone, rosuvastatin, sildenafil, sildenafil, esomeprazole, and emtricitabine-tenofovir AF.   Meds ordered this encounter  Medications   emtricitabine-tenofovir AF (DESCOVY) 200-25 MG tablet    Sig: TAKE 1 TABLET BY MOUTH DAILY.    Dispense:  30 tablet    Refill:  3    Supervising Provider:   Liane Redman [id number]    Prescription Type::   Renewal     Follow-up: Return in about 3 months (around 10/02/2023). or sooner if needed.   James Silva, MSN, FNP-C Nurse Practitioner [facility]  for Infectious Disease [facility] Main number: [phone]

## 2023-07-04 LAB — GC/CHLAMYDIA PROBE, AMP (THROAT)
Chlamydia trachomatis RNA: NOT DETECTED
Neisseria gonorrhoeae RNA: NOT DETECTED

## 2023-07-04 LAB — COMPLETE METABOLIC PANEL WITHOUT GFR
AG Ratio: 1.7 (calc) (ref 1.0–2.5)
ALT: 21 U/L (ref 9–46)
AST: 19 U/L (ref 10–35)
Albumin: 5 g/dL (ref 3.6–5.1)
Alkaline phosphatase (APISO): 65 U/L (ref 35–144)
BUN/Creatinine Ratio: 38 (calc) — ABNORMAL HIGH (ref 6–22)
BUN: 30 mg/dL — ABNORMAL HIGH (ref 7–25)
CO2: 27 mmol/L (ref 20–32)
Calcium: 10.2 mg/dL (ref 8.6–10.3)
Chloride: 100 mmol/L (ref 98–110)
Creat: 0.8 mg/dL (ref 0.70–1.35)
Globulin: 2.9 g/dL (ref 1.9–3.7)
Glucose, Bld: 87 mg/dL (ref 65–99)
Potassium: 4.1 mmol/L (ref 3.5–5.3)
Sodium: 138 mmol/L (ref 135–146)
Total Bilirubin: 0.4 mg/dL (ref 0.2–1.2)
Total Protein: 7.9 g/dL (ref 6.1–8.1)

## 2023-07-04 LAB — HIV ANTIBODY (ROUTINE TESTING W REFLEX): HIV 1&2 Ab, 4th Generation: NONREACTIVE

## 2023-07-04 LAB — CT/NG RNA, TMA RECTAL
Chlamydia Trachomatis RNA: NOT DETECTED
Neisseria Gonorrhoeae RNA: NOT DETECTED

## 2023-07-04 LAB — C. TRACHOMATIS/N. GONORRHOEAE RNA
C. trachomatis RNA, TMA: NOT DETECTED
N. gonorrhoeae RNA, TMA: NOT DETECTED

## 2023-07-04 LAB — RPR: RPR Ser Ql: NONREACTIVE

## 2023-07-07 ENCOUNTER — Other Ambulatory Visit: Payer: Self-pay

## 2023-07-07 ENCOUNTER — Other Ambulatory Visit: Payer: Self-pay | Admitting: Family

## 2023-07-07 ENCOUNTER — Other Ambulatory Visit (HOSPITAL_COMMUNITY): Payer: Self-pay

## 2023-07-07 MED ORDER — DOXYCYCLINE HYCLATE 100 MG PO TABS
100.0000 mg | ORAL_TABLET | Freq: Two times a day (BID) | ORAL | 0 refills | Status: DC
  Filled 2023-07-07: qty 28, 14d supply, fill #0

## 2023-07-08 ENCOUNTER — Other Ambulatory Visit: Payer: Self-pay

## 2023-07-11 ENCOUNTER — Other Ambulatory Visit: Payer: Self-pay

## 2023-07-14 ENCOUNTER — Other Ambulatory Visit: Payer: Self-pay | Admitting: Pharmacy Technician

## 2023-07-14 ENCOUNTER — Other Ambulatory Visit: Payer: Self-pay | Admitting: Family

## 2023-07-14 ENCOUNTER — Other Ambulatory Visit: Payer: Self-pay

## 2023-07-14 ENCOUNTER — Other Ambulatory Visit (HOSPITAL_COMMUNITY): Payer: Self-pay

## 2023-07-14 DIAGNOSIS — Z79899 Other long term (current) drug therapy: Secondary | ICD-10-CM

## 2023-07-14 NOTE — Progress Notes (Signed)
 Specialty Pharmacy Refill Coordination Note  James Barry is a 68 y.o. male contacted today regarding refills of specialty medication(s) Emtricitabine -Tenofovir  AF (DESCOVY )   Patient requested Pickup at [facility] at [place] date: 07/15/23   Medication will be filled on 07/15/23.   This fill date is pending response to refill request from provider. Patient is aware and if they have not received fill by intended date they must follow up with pharmacy.   Inform patient that we need refill request but patient adament about pickup tomorrow. Asked patient to call pharmacy first before pickup incase refill is pending by MD.

## 2023-07-28 DIAGNOSIS — L578 Other skin changes due to chronic exposure to nonionizing radiation: Secondary | ICD-10-CM | POA: Diagnosis not present

## 2023-07-28 DIAGNOSIS — Z86018 Personal history of other benign neoplasm: Secondary | ICD-10-CM | POA: Diagnosis not present

## 2023-07-28 DIAGNOSIS — Z872 Personal history of diseases of the skin and subcutaneous tissue: Secondary | ICD-10-CM | POA: Diagnosis not present

## 2023-07-28 DIAGNOSIS — L821 Other seborrheic keratosis: Secondary | ICD-10-CM | POA: Diagnosis not present

## 2023-07-28 DIAGNOSIS — L570 Actinic keratosis: Secondary | ICD-10-CM | POA: Diagnosis not present

## 2023-07-31 ENCOUNTER — Other Ambulatory Visit: Payer: Self-pay

## 2023-08-05 ENCOUNTER — Other Ambulatory Visit: Payer: Self-pay

## 2023-08-05 NOTE — Progress Notes (Signed)
 Specialty Pharmacy Ongoing Clinical Assessment Note  James Barry is a 68 y.o. male who is being followed by the specialty pharmacy service for RxSp HIV PrEP   Patient's specialty medication(s) reviewed today: No data recorded  Missed doses in the last 4 weeks: 0   Patient/Caregiver did not have any additional questions or concerns.   Therapeutic benefit summary: Patient is achieving benefit   Adverse events/side effects summary: No adverse events/side effects   Patient's therapy is appropriate to: Continue    Goals Addressed             This Visit's Progress    Comply with lab assessments   On track    Patient is on track. Patient will maintain adherence      Maintain optimal adherence to therapy   On track    Patient is on track. Patient will maintain adherence      Practice safe sex   On track    Patient is on track. Patient will  utilize condoms appropriately.         Follow up: 6 months  [facility]

## 2023-08-06 ENCOUNTER — Other Ambulatory Visit: Payer: Self-pay

## 2023-08-07 ENCOUNTER — Other Ambulatory Visit (HOSPITAL_COMMUNITY): Payer: Self-pay

## 2023-08-07 ENCOUNTER — Other Ambulatory Visit (HOSPITAL_COMMUNITY): Payer: Self-pay | Admitting: Pharmacy Technician

## 2023-08-07 NOTE — Progress Notes (Signed)
 Specialty Pharmacy Refill Coordination Note  James Barry is a 68 y.o. male contacted today regarding refills of specialty medication(s) Emtricitabine -Tenofovir  AF (DESCOVY )   Patient requested [name] at [facility] at [place] date: 08/15/23   Medication will be filled on 08/15/23.

## 2023-08-15 ENCOUNTER — Other Ambulatory Visit: Payer: Self-pay

## 2023-09-05 ENCOUNTER — Encounter (INDEPENDENT_AMBULATORY_CARE_PROVIDER_SITE_OTHER): Payer: Self-pay

## 2023-09-05 ENCOUNTER — Other Ambulatory Visit: Payer: Self-pay

## 2023-09-05 NOTE — Progress Notes (Signed)
 Specialty Pharmacy Refill Coordination Note  Husband James Barry) submitted questionnaire for Descovy  refill with pickup date of 09/15/23.  Per outreach details note of James Barry profile, I scheduled James Barry Descovy  fill for pickup on 09/15/23 at [facility] as well.

## 2023-09-08 ENCOUNTER — Other Ambulatory Visit: Payer: Self-pay

## 2023-09-15 ENCOUNTER — Other Ambulatory Visit: Payer: Self-pay

## 2023-09-22 ENCOUNTER — Other Ambulatory Visit (HOSPITAL_COMMUNITY): Payer: Self-pay

## 2023-09-26 ENCOUNTER — Ambulatory Visit: Payer: Self-pay | Admitting: Primary Care

## 2023-09-26 ENCOUNTER — Encounter: Payer: Self-pay | Admitting: Primary Care

## 2023-09-26 ENCOUNTER — Ambulatory Visit (INDEPENDENT_AMBULATORY_CARE_PROVIDER_SITE_OTHER): Payer: Commercial Managed Care - PPO | Admitting: Primary Care

## 2023-09-26 VITALS — BP 148/86 | HR 80 | Temp 97.2°F | Ht 67.0 in | Wt 156.0 lb

## 2023-09-26 DIAGNOSIS — Z7252 High risk homosexual behavior: Secondary | ICD-10-CM

## 2023-09-26 DIAGNOSIS — K219 Gastro-esophageal reflux disease without esophagitis: Secondary | ICD-10-CM | POA: Diagnosis not present

## 2023-09-26 DIAGNOSIS — I2510 Atherosclerotic heart disease of native coronary artery without angina pectoris: Secondary | ICD-10-CM

## 2023-09-26 DIAGNOSIS — Z0000 Encounter for general adult medical examination without abnormal findings: Secondary | ICD-10-CM

## 2023-09-26 DIAGNOSIS — N529 Male erectile dysfunction, unspecified: Secondary | ICD-10-CM | POA: Diagnosis not present

## 2023-09-26 DIAGNOSIS — E7849 Other hyperlipidemia: Secondary | ICD-10-CM

## 2023-09-26 DIAGNOSIS — Z8546 Personal history of malignant neoplasm of prostate: Secondary | ICD-10-CM | POA: Diagnosis not present

## 2023-09-26 DIAGNOSIS — I10 Essential (primary) hypertension: Secondary | ICD-10-CM

## 2023-09-26 LAB — LIPID PANEL
Cholesterol: 117 mg/dL (ref 0–200)
HDL: 47.3 mg/dL (ref >39.00)
LDL Cholesterol: 41 mg/dL (ref 0–99)
NonHDL: 69.97
Total CHOL/HDL Ratio: 2
Triglycerides: 147 mg/dL (ref 0.0–149.0)
VLDL: 29.4 mg/dL (ref 0.0–40.0)

## 2023-09-26 NOTE — Assessment & Plan Note (Signed)
 Stable.  Following with GI, reviewed office notes from April 2025. Continue Nexium  40 mg daily.

## 2023-09-26 NOTE — Assessment & Plan Note (Signed)
 Asymptomatic.  Following with cardiology, reviewed office notes from October 2024.  Continue aspirin  81 mg, rosuvastatin  10 mg daily.

## 2023-09-26 NOTE — Progress Notes (Addendum)
 Subjective:    Patient ID: James Barry, male    DOB: 03-09-1956, 68 y.o.   MRN: [id number]  HPI  James Barry is a very pleasant 68 y.o. male who presents today for complete physical and follow up of chronic conditions.  Immunizations: -Tetanus: Completed in 2021  -Shingles: Completed Shingrix series -Pneumonia: Completed Prevnar 20 in 2020  Diet: Fair diet.  Exercise: Regular exercise.  Eye exam: Completes annually  Dental exam: Completes semi-annually    Colonoscopy: Completed in 2024, due 2031 but he will have this redone in September 2025.  PSA: Due  BP Readings from Last 3 Encounters:  09/26/23 (!) 148/86  07/03/23 (!) 149/99  06/26/23 120/84         Review of Systems  Constitutional:  Negative for unexpected weight change.  HENT:  Negative for rhinorrhea.   Eyes:  Negative for visual disturbance.  Respiratory:  Negative for cough and shortness of breath.   Cardiovascular:  Negative for chest pain.  Gastrointestinal:  Negative for constipation and diarrhea.  Genitourinary:  Negative for difficulty urinating.  Musculoskeletal:  Positive for arthralgias.  Skin:  Negative for rash.  Allergic/Immunologic: Negative for environmental allergies.  Neurological:  Negative for dizziness, numbness and headaches.  Psychiatric/Behavioral:  The patient is not nervous/anxious.          Past Medical History:  Diagnosis Date   Acute foot pain, right 09/24/2022   Acute neck pain 08/19/2019   Acute thoracic back pain 07/30/2019   Arthritis    Cancer [facility])    prostate   Clavicular asymmetry 07/30/2019   Diverticulosis    Elevated PSA 04/20/2020   Erectile dysfunction    GERD (gastroesophageal reflux disease)    History of diverticulitis 08/29/2020   Hyperlipidemia    Malignant neoplasm of prostate (HCC) 10/18/2020   Vertigo     Social History   Socioeconomic History   Marital status: Married    Spouse name: Not on file   Number of children: Not on  file   Years of education: Not on file   Highest education level: Not on file  Occupational History   Not on file  Tobacco Use   Smoking status: Never   Smokeless tobacco: Never  Vaping Use   Vaping status: Never Used  Substance and Sexual Activity   Alcohol use: Yes    Alcohol/week: 0.0 standard drinks of alcohol    Comment: social   Drug use: No   Sexual activity: Yes    Partners: Male    Comment: offered condoms  Other Topics Concern   Not on file  Social History Narrative   Married.   No children.   Retired.   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   Social Drivers of Corporate investment banker Strain: Not on file  Food Insecurity: Not on file  Transportation Needs: Not on file  Physical Activity: Not on file  Stress: Not on file  Social Connections: Not on file  Intimate Partner Violence: Not on file    Past Surgical History:  Procedure Laterality Date   COLONOSCOPY  03/19/2011   cleared for 5 yrs- Dr @ [facility])   HERNIA REPAIR     LYMPHADENECTOMY Bilateral 12/04/2020   Procedure: LYMPHADENECTOMY, PELVIC;  Surgeon: Renda Glance, MD;  Location: [facility];  Service: Urology;  Laterality: Bilateral;   ROBOT ASSISTED LAPAROSCOPIC RADICAL PROSTATECTOMY N/A 12/04/2020   Procedure: XI ROBOTIC ASSISTED LAPAROSCOPIC RADICAL  PROSTATECTOMY LEVEL 2;  Surgeon: Renda Glance, MD;  Location: [facility];  Service: Urology;  Laterality: N/A;   UPPER GASTROINTESTINAL ENDOSCOPY      Family History  Problem Relation Age of Onset   Breast cancer Mother    Lung cancer Father    Heart disease Father    Hypertension Father    Arthritis Maternal Grandmother    Colon cancer Neg Hx    Colon polyps Neg Hx    Esophageal cancer Neg Hx    Rectal cancer Neg Hx    Stomach cancer Neg Hx     Allergies  Allergen Reactions   Buspirone     Unaware    Cephalexin     Unaware     Current Outpatient Medications on File Prior to  Visit  Medication Sig Dispense Refill   acetaminophen  (TYLENOL ) 650 MG CR tablet Take 1,300 mg by mouth every morning.     aspirin  EC 81 MG tablet Take 1 tablet (81 mg total) by mouth daily. Swallow whole. 90 tablet 3   docusate sodium  (COLACE) 100 MG capsule Take 1 capsule (100 mg total) by mouth 2 (two) times daily.     emtricitabine -tenofovir  AF (DESCOVY ) 200-25 MG tablet TAKE 1 TABLET BY MOUTH DAILY. 30 tablet 3   esomeprazole  (NEXIUM ) 40 MG capsule Take 1 capsule (40 mg total) by mouth daily for heartburn. 90 capsule 1   neomycin -polymyxin-dexamethasone  (MAXITROL ) 0.1 % ophthalmic suspension Instill 1 drop into both eyes four times a day 5 mL 0   rosuvastatin  (CRESTOR ) 10 MG tablet Take 1 tablet (10 mg total) by mouth daily. 90 tablet 3   sildenafil  (VIAGRA ) 100 MG tablet Take 1 tablet by mouth 30 - 60 minutes prior to sexual activity as needed. 90 tablet 1   sildenafil  (VIAGRA ) 100 MG tablet Take 1 tablet (100 mg total) by mouth when needed as directed. 30 tablet 11   No current facility-administered medications on file prior to visit.    BP (!) 148/86   Pulse 80   Temp (!) 97.2 F (36.2 C) (Temporal)   Ht 5' 7 (1.702 m)   Wt 156 lb (70.8 kg)   SpO2 96%   BMI 24.43 kg/m  Objective:   Physical Exam HENT:     Right Ear: Tympanic membrane and ear canal normal.     Left Ear: Tympanic membrane and ear canal normal.  Eyes:     Pupils: Pupils are equal, round, and reactive to light.  Cardiovascular:     Rate and Rhythm: Normal rate and regular rhythm.  Pulmonary:     Effort: Pulmonary effort is normal.     Breath sounds: Normal breath sounds.  Abdominal:     General: Bowel sounds are normal.     Palpations: Abdomen is soft.     Tenderness: There is no abdominal tenderness.  Musculoskeletal:        General: Normal range of motion.     Cervical back: Neck supple.  Skin:    General: Skin is warm and dry.  Neurological:     Mental Status: He is alert and oriented to person,  place, and time.     Cranial Nerves: No cranial nerve deficit.     Deep Tendon Reflexes:     Reflex Scores:      Patellar reflexes are 2+ on the right side and 2+ on the left side. Psychiatric:        Mood and Affect: Mood normal.  Assessment & Plan:  Preventative health care Assessment & Plan: Immunizations UTD. Colonoscopy UTD, due September 2025 PSA UTD, follows with Urology   Discussed the importance of a healthy diet and regular exercise in order for weight loss, and to reduce the risk of further co-morbidity.  Exam stable. Labs pending.  Follow up in 1 year for repeat physical.    History of prostate cancer Assessment & Plan: In remission.  Follows with Urology.   Coronary artery disease involving native heart without angina pectoris, unspecified vessel or lesion type Assessment & Plan: Asymptomatic.  Following with cardiology, reviewed office notes from October 2024.  Continue aspirin  81 mg, rosuvastatin  10 mg daily.   Orders: -     Lipid panel  Gastroesophageal reflux disease without esophagitis Assessment & Plan: Stable.  Following with GI, reviewed office notes from April 2025. Continue Nexium  40 mg daily.     Erectile dysfunction, unspecified erectile dysfunction type Assessment & Plan: Controlled.  Continue Viagra  100 mg PRN.    Familial multiple lipoprotein-type hyperlipidemia Assessment & Plan: Repeat lipid panel pending. Continue rosuvastatin  to 10 mg daily.    High risk homosexual behavior Assessment & Plan: Following with infectious disease, office notes and labs reviewed from April 2025.  Continue Descovy  200-25 mg daily.    Essential (primary) hypertension Assessment & Plan: Above goal today, slightly improved upon recheck. I have advised that he monitor at home and report readings consistently at or above 140/90.         Lleyton Byers K Kaleiyah Polsky, NP

## 2023-09-26 NOTE — Assessment & Plan Note (Signed)
 Repeat lipid panel pending. Continue rosuvastatin  to 10 mg daily.

## 2023-09-26 NOTE — Assessment & Plan Note (Signed)
 Immunizations UTD. Colonoscopy UTD, due September 2025 PSA UTD, follows with Urology   Discussed the importance of a healthy diet and regular exercise in order for weight loss, and to reduce the risk of further co-morbidity.  Exam stable. Labs pending.  Follow up in 1 year for repeat physical.

## 2023-09-26 NOTE — Assessment & Plan Note (Signed)
 Controlled.  Continue Viagra  100 mg PRN.

## 2023-09-26 NOTE — Assessment & Plan Note (Signed)
 Above goal today, slightly improved upon recheck. I have advised that he monitor at home and report readings consistently at or above 140/90.

## 2023-09-26 NOTE — Assessment & Plan Note (Signed)
 In remission.  Follows with Urology.

## 2023-09-26 NOTE — Assessment & Plan Note (Signed)
 Following with infectious disease, office notes and labs reviewed from April 2025.  Continue Descovy  200-25 mg daily.

## 2023-09-26 NOTE — Patient Instructions (Signed)
 Stop by the lab prior to leaving today. I will notify you of your results once received.   It was a pleasure to see you today!

## 2023-09-30 ENCOUNTER — Ambulatory Visit: Admitting: Family

## 2023-09-30 ENCOUNTER — Other Ambulatory Visit (HOSPITAL_COMMUNITY)
Admission: RE | Admit: 2023-09-30 | Discharge: 2023-09-30 | Disposition: A | Discharge status: Home / Self Care | Source: Ambulatory Visit | Attending: Family | Admitting: Family

## 2023-09-30 ENCOUNTER — Other Ambulatory Visit: Payer: Self-pay

## 2023-09-30 ENCOUNTER — Encounter: Payer: Self-pay | Admitting: Family

## 2023-09-30 VITALS — BP 143/89 | HR 70 | Temp 98.0°F | Ht 67.0 in | Wt 156.4 lb

## 2023-09-30 DIAGNOSIS — Z113 Encounter for screening for infections with a predominantly sexual mode of transmission: Secondary | ICD-10-CM

## 2023-09-30 DIAGNOSIS — Z79899 Other long term (current) drug therapy: Secondary | ICD-10-CM | POA: Diagnosis not present

## 2023-09-30 MED ORDER — EMTRICITABINE-TENOFOVIR AF 200-25 MG PO TABS
1.0000 | ORAL_TABLET | Freq: Every day | ORAL | 3 refills | Status: DC
  Filled 2023-09-30 – 2023-10-08 (×2): qty 30, 30d supply, fill #0
  Filled 2023-11-11: qty 30, 30d supply, fill #1
  Filled 2023-12-15: qty 30, 30d supply, fill #2
  Filled 2024-01-09: qty 30, 30d supply, fill #3

## 2023-09-30 NOTE — Progress Notes (Signed)
 Subjective:   Patient ID: James Barry, male    DOB: [birth year], 68 y.o.   MRN: [id number]  Chief Complaint  Patient presents with   PrEP    HPI:  James Barry is a 68 y.o. male on preexposure prophylaxis for HIV last seen on 07/03/2023 with good adherence and tolerance to Descovy  and negative HIV and STD testing.  Here today for follow-up.  [name] has been doing well since his last office visit and continues to take Descovy  as prescribed with no adverse side effects or problems obtaining medication from the pharmacy.  Currently sexually active with condoms and site-specific STD testing offered.  Denies fevers, chills, night sweats, headaches, changes in vision, neck pain/stiffness, nausea, diarrhea, vomiting, lesions or rashes.    Allergies  Allergen Reactions   Buspirone     Unaware    Cephalexin     Unaware       Outpatient Medications Prior to Visit  Medication Sig Dispense Refill   acetaminophen  (TYLENOL ) 650 MG CR tablet Take 1,300 mg by mouth every morning.     aspirin  EC 81 MG tablet Take 1 tablet (81 mg total) by mouth daily. Swallow whole. 90 tablet 3   docusate sodium  (COLACE) 100 MG capsule Take 1 capsule (100 mg total) by mouth 2 (two) times daily.     esomeprazole  (NEXIUM ) 40 MG capsule Take 1 capsule (40 mg total) by mouth daily for heartburn. 90 capsule 1   neomycin -polymyxin-dexamethasone  (MAXITROL ) 0.1 % ophthalmic suspension Instill 1 drop into both eyes four times a day 5 mL 0   rosuvastatin  (CRESTOR ) 10 MG tablet Take 1 tablet (10 mg total) by mouth daily. 90 tablet 3   sildenafil  (VIAGRA ) 100 MG tablet Take 1 tablet by mouth 30 - 60 minutes prior to sexual activity as needed. 90 tablet 1   sildenafil  (VIAGRA ) 100 MG tablet Take 1 tablet (100 mg total) by mouth when needed as directed. 30 tablet 11   emtricitabine -tenofovir  AF (DESCOVY ) 200-25 MG tablet TAKE 1 TABLET BY MOUTH DAILY. 30 tablet 3   No facility-administered medications prior to visit.      Past Medical History:  Diagnosis Date   Acute foot pain, right 09/24/2022   Acute neck pain 08/19/2019   Acute thoracic back pain 07/30/2019   Arthritis    Cancer (HCC)    prostate   Clavicular asymmetry 07/30/2019   Diverticulosis    Elevated PSA 04/20/2020   Erectile dysfunction    GERD (gastroesophageal reflux disease)    History of diverticulitis 08/29/2020   Hyperlipidemia    Malignant neoplasm of prostate (HCC) 10/18/2020   Vertigo      Past Surgical History:  Procedure Laterality Date   COLONOSCOPY  03/19/2011   cleared for 5 yrs- Dr @ [facility])   HERNIA REPAIR     LYMPHADENECTOMY Bilateral 12/04/2020   Procedure: LYMPHADENECTOMY, PELVIC;  Surgeon: Renda Glance, MD;  Location: [facility];  Service: Urology;  Laterality: Bilateral;   ROBOT ASSISTED LAPAROSCOPIC RADICAL PROSTATECTOMY N/A 12/04/2020   Procedure: XI ROBOTIC ASSISTED LAPAROSCOPIC RADICAL PROSTATECTOMY LEVEL 2;  Surgeon: Renda Glance, MD;  Location: [facility];  Service: Urology;  Laterality: N/A;   UPPER GASTROINTESTINAL ENDOSCOPY         Review of Systems  Constitutional:  Negative for appetite change, chills, fatigue, fever and unexpected weight change.  Eyes:  Negative for visual disturbance.  Respiratory:  Negative for cough, chest tightness, shortness of breath and wheezing.  Cardiovascular:  Negative for chest pain and leg swelling.  Gastrointestinal:  Negative for abdominal pain, constipation, diarrhea, nausea and vomiting.  Genitourinary:  Negative for dysuria, flank pain, frequency, genital sores, hematuria and urgency.  Skin:  Negative for rash.  Allergic/Immunologic: Negative for immunocompromised state.  Neurological:  Negative for dizziness and headaches.    Objective:   BP (!) 143/89   Pulse 70   Temp 98 F (36.7 C) (Oral)   Ht 5' 7 (1.702 m)   Wt 156 lb 6.4 oz (70.9 kg)   SpO2 98%   BMI 24.50 kg/m  Nursing note and vital signs reviewed.  Physical  Exam Constitutional:      General: He is not in acute distress.    Appearance: He is well-developed.  Eyes:     Conjunctiva/sclera: Conjunctivae normal.  Cardiovascular:     Rate and Rhythm: Normal rate and regular rhythm.     Heart sounds: Normal heart sounds. No murmur heard.    No friction rub. No gallop.  Pulmonary:     Effort: Pulmonary effort is normal. No respiratory distress.     Breath sounds: Normal breath sounds. No wheezing or rales.  Chest:     Chest wall: No tenderness.  Abdominal:     General: Bowel sounds are normal.     Palpations: Abdomen is soft.     Tenderness: There is no abdominal tenderness.  Musculoskeletal:     Cervical back: Neck supple.  Lymphadenopathy:     Cervical: No cervical adenopathy.  Skin:    General: Skin is warm and dry.     Findings: No rash.  Neurological:     Mental Status: He is alert and oriented to person, place, and time.  Psychiatric:        Behavior: Behavior normal.        Thought Content: Thought content normal.        Judgment: Judgment normal.         09/26/2023   10:47 AM 07/03/2023    3:10 PM 01/15/2023    2:28 PM 10/07/2022    1:49 PM 09/24/2022   10:36 AM  Depression screen PHQ 2/9  Decreased Interest 0 0 0 0 0  Down, Depressed, Hopeless 0 0 0 0 0  PHQ - 2 Score 0 0 0 0 0  Altered sleeping  0  0 0  Tired, decreased energy  0  0 0  Change in appetite  0  0 0  Feeling bad or failure about yourself   0  0 0  Trouble concentrating  0  0 0  Moving slowly or fidgety/restless  0  0 0  Suicidal thoughts  0  0 0  PHQ-9 Score  0  0 0  Difficult doing work/chores  Not difficult at all   Not difficult at all     Assessment & Plan:    Patient Active Problem List   Diagnosis Date Noted   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   CAD (coronary artery disease) 09/20/2021   Therapeutic drug monitoring 09/05/2021   Precordial chest pain 07/19/2021   Elevated blood pressure reading 07/19/2021   History of  prostate cancer 12/04/2020   High risk homosexual behavior 09/23/2018   Preventative health care 08/24/2018   On pre-exposure prophylaxis for HIV 12/17/2017   Chronic left shoulder pain 08/13/2017   Nocturia 08/13/2017   Exposure to HIV 06/06/2017   Erectile dysfunction 06/28/2015   Familial multiple lipoprotein-type hyperlipidemia 07/14/2014  Abnormal LFTs 07/14/2014   Essential (primary) hypertension 07/14/2014   Acid reflux 07/14/2014     Problem List Items Addressed This Visit       Other   On pre-exposure prophylaxis for HIV - Primary   Mr. Capozzi continues to have good adherence and tolerance to Descovy  for PrEP. Reviewed previous lab work and discussed importance of safe sexual practice and condom use. Check HIV and STD lab work. Continue current dose of Descovy . Plan for follow up in 3 months or sooner if needed.       Relevant Medications   emtricitabine -tenofovir  AF (DESCOVY ) 200-25 MG tablet   Other Relevant Orders   Basic metabolic panel with GFR   RPR   Urine cytology ancillary only   Cytology (oral, anal, urethral) ancillary only   Cytology (oral, anal, urethral) ancillary only   HIV Antibody (routine testing w rflx)   Other Visit Diagnoses       Screening for STDs (sexually transmitted diseases)       Relevant Orders   Basic metabolic panel with GFR   RPR   Urine cytology ancillary only   Cytology (oral, anal, urethral) ancillary only   Cytology (oral, anal, urethral) ancillary only   HIV Antibody (routine testing w rflx)        I am having Garnette CHARM Myrna Marcey maintain his acetaminophen , docusate sodium , aspirin  EC, neomycin -polymyxin-dexamethasone , rosuvastatin , sildenafil , sildenafil , esomeprazole , and emtricitabine -tenofovir  AF.   Meds ordered this encounter  Medications   emtricitabine -tenofovir  AF (DESCOVY ) 200-25 MG tablet    Sig: TAKE 1 TABLET BY MOUTH DAILY.    Dispense:  30 tablet    Refill:  3    Supervising Provider:   LUIZ CHANNEL [phone]    Prescription Type::   Renewal     Follow-up: Return in about 3 months (around 12/31/2023). or sooner if needed.   Cathlyn July, MSN, FNP-C Nurse Practitioner [facility] for Infectious Disease [facility] Main number: [phone]

## 2023-09-30 NOTE — Patient Instructions (Addendum)
 Nice to see you.  We will check your lab work today.  Continue to take your medication daily as prescribed.  Refills have been sent to the pharmacy.  Plan for follow up in 3 months or sooner if needed with lab work on the same day.  Have a great day and stay safe!

## 2023-09-30 NOTE — Assessment & Plan Note (Signed)
 Mr. Blasco continues to have good adherence and tolerance to Descovy  for PrEP. Reviewed previous lab work and discussed importance of safe sexual practice and condom use. Check HIV and STD lab work. Continue current dose of Descovy . Plan for follow up in 3 months or sooner if needed.

## 2023-10-01 LAB — CYTOLOGY, (ORAL, ANAL, URETHRAL) ANCILLARY ONLY
Chlamydia: NEGATIVE
Chlamydia: NEGATIVE
Comment: NEGATIVE
Comment: NEGATIVE
Comment: NORMAL
Comment: NORMAL
Neisseria Gonorrhea: NEGATIVE
Neisseria Gonorrhea: NEGATIVE

## 2023-10-01 LAB — RPR: RPR Ser Ql: NONREACTIVE

## 2023-10-01 LAB — BASIC METABOLIC PANEL WITH GFR
BUN: 11 mg/dL (ref 7–25)
CO2: 28 mmol/L (ref 20–32)
Calcium: 10.4 mg/dL — ABNORMAL HIGH (ref 8.6–10.3)
Chloride: 101 mmol/L (ref 98–110)
Creat: 0.86 mg/dL (ref 0.70–1.35)
Glucose, Bld: 87 mg/dL (ref 65–99)
Potassium: 4.1 mmol/L (ref 3.5–5.3)
Sodium: 139 mmol/L (ref 135–146)
eGFR: 94 mL/min/1.73m2 (ref >=60)

## 2023-10-01 LAB — URINE CYTOLOGY ANCILLARY ONLY
Chlamydia: NEGATIVE
Comment: NEGATIVE
Comment: NORMAL
Neisseria Gonorrhea: NEGATIVE

## 2023-10-01 LAB — HIV ANTIBODY (ROUTINE TESTING W REFLEX): HIV 1&2 Ab, 4th Generation: NONREACTIVE

## 2023-10-02 ENCOUNTER — Ambulatory Visit: Payer: Self-pay | Admitting: Family

## 2023-10-08 ENCOUNTER — Encounter (INDEPENDENT_AMBULATORY_CARE_PROVIDER_SITE_OTHER): Payer: Self-pay

## 2023-10-08 ENCOUNTER — Other Ambulatory Visit: Payer: Self-pay

## 2023-10-08 ENCOUNTER — Other Ambulatory Visit (HOSPITAL_COMMUNITY): Payer: Self-pay

## 2023-10-09 ENCOUNTER — Other Ambulatory Visit: Payer: Self-pay

## 2023-10-09 ENCOUNTER — Other Ambulatory Visit (HOSPITAL_COMMUNITY): Payer: Self-pay

## 2023-10-09 NOTE — Progress Notes (Signed)
 Specialty Pharmacy Refill Coordination Note  James Barry is a 68 y.o. male contacted today regarding refills of specialty medication(s) Emtricitabine -Tenofovir  AF (DESCOVY )   Patient requested (Patient-Rptd) Pickup at [facility] date: (Patient-Rptd) 10/16/23   Medication will be filled on 07.30.25.

## 2023-10-14 ENCOUNTER — Other Ambulatory Visit: Payer: Self-pay

## 2023-10-15 ENCOUNTER — Other Ambulatory Visit: Payer: Self-pay

## 2023-10-31 ENCOUNTER — Encounter: Payer: Self-pay | Admitting: Gastroenterology

## 2023-11-10 ENCOUNTER — Other Ambulatory Visit: Payer: Self-pay

## 2023-11-11 ENCOUNTER — Other Ambulatory Visit: Payer: Self-pay

## 2023-11-11 ENCOUNTER — Encounter (INDEPENDENT_AMBULATORY_CARE_PROVIDER_SITE_OTHER): Payer: Self-pay

## 2023-11-11 NOTE — Progress Notes (Signed)
 Specialty Pharmacy Refill Coordination Note  James Barry is a 68 y.o. male contacted today regarding refills of specialty medication(s) Emtricitabine -Tenofovir  AF (DESCOVY )   Patient requested (Patient-Rptd) Pickup at [facility] at [facility] date: (Patient-Rptd) 11/18/23   Medication will be filled on 11/14/23.

## 2023-11-13 ENCOUNTER — Other Ambulatory Visit: Payer: Self-pay

## 2023-11-28 ENCOUNTER — Ambulatory Visit (AMBULATORY_SURGERY_CENTER): Admitting: *Deleted

## 2023-11-28 ENCOUNTER — Encounter: Payer: Self-pay | Admitting: Gastroenterology

## 2023-11-28 ENCOUNTER — Other Ambulatory Visit (HOSPITAL_COMMUNITY): Payer: Self-pay

## 2023-11-28 VITALS — Ht 67.0 in | Wt 155.0 lb

## 2023-11-28 DIAGNOSIS — Z860100 Personal history of colon polyps, unspecified: Secondary | ICD-10-CM

## 2023-11-28 MED ORDER — NA SULFATE-K SULFATE-MG SULF 17.5-3.13-1.6 GM/177ML PO SOLN
1.0000 | Freq: Once | ORAL | 0 refills | Status: AC
  Filled 2023-11-28: qty 354, 1d supply, fill #0

## 2023-11-28 NOTE — Progress Notes (Signed)
 Pt's name and DOB verified at the beginning of the pre-visit with 2 identifiers  Pt denies any difficulty with ambulating,sitting, laying down or rolling side to side  Pt has no issues moving head neck or swallowing  No egg or soy allergy known to patient   No issues known to pt with past sedation  No FH of Malignant Hyperthermia  Pt is not on home 02   Pt is not on blood thinners   Pt denies issues with constipation   Pt is not on dialysis  Pt denise any abnormal heart rhythms   Pt denies any upcoming cardiac testing  Chart not reviewed by CRNA prior to Procedure  Visit in person  Pt states weight is 155 lb  Pt given  both [facility] main # and MD on call # prior to instructions.  Informed pt to come in at the time discussed and is shown on PV instructions.  Pt instructed to use Singlecare.com or GoodRx for a price reduction on prep  Instructed pt where to find PV instructions in My Chart  Instructed pt on all aspects of written instructions including med holds clothing to wear and foods to eat and not eat as well as after procedure legal restrictions and to call MD on call if needed.. Pt states understanding. Instructed pt to review instructions again prior to procedure and call main # given if has any questions or any issues. Pt states they will.

## 2023-12-10 ENCOUNTER — Encounter: Payer: Self-pay | Admitting: Gastroenterology

## 2023-12-10 ENCOUNTER — Other Ambulatory Visit (HOSPITAL_COMMUNITY): Payer: Self-pay

## 2023-12-10 ENCOUNTER — Ambulatory Visit (AMBULATORY_SURGERY_CENTER): Admitting: Gastroenterology

## 2023-12-10 VITALS — BP 145/92 | HR 79 | Temp 97.5°F | Resp 13 | Ht 67.0 in | Wt 155.0 lb

## 2023-12-10 DIAGNOSIS — K6289 Other specified diseases of anus and rectum: Secondary | ICD-10-CM | POA: Diagnosis not present

## 2023-12-10 DIAGNOSIS — K635 Polyp of colon: Secondary | ICD-10-CM

## 2023-12-10 DIAGNOSIS — Z860101 Personal history of adenomatous and serrated colon polyps: Secondary | ICD-10-CM | POA: Diagnosis not present

## 2023-12-10 DIAGNOSIS — K5730 Diverticulosis of large intestine without perforation or abscess without bleeding: Secondary | ICD-10-CM

## 2023-12-10 DIAGNOSIS — Z1211 Encounter for screening for malignant neoplasm of colon: Secondary | ICD-10-CM | POA: Diagnosis not present

## 2023-12-10 DIAGNOSIS — R933 Abnormal findings on diagnostic imaging of other parts of digestive tract: Secondary | ICD-10-CM | POA: Diagnosis not present

## 2023-12-10 DIAGNOSIS — K639 Disease of intestine, unspecified: Secondary | ICD-10-CM | POA: Diagnosis not present

## 2023-12-10 DIAGNOSIS — K380 Hyperplasia of appendix: Secondary | ICD-10-CM | POA: Diagnosis not present

## 2023-12-10 DIAGNOSIS — Z860100 Personal history of colon polyps, unspecified: Secondary | ICD-10-CM | POA: Diagnosis not present

## 2023-12-10 DIAGNOSIS — K648 Other hemorrhoids: Secondary | ICD-10-CM | POA: Diagnosis not present

## 2023-12-10 DIAGNOSIS — D120 Benign neoplasm of cecum: Secondary | ICD-10-CM

## 2023-12-10 MED ORDER — SODIUM CHLORIDE 0.9 % IV SOLN: 500.0000 mL | Freq: Once | INTRAVENOUS | Status: AC

## 2023-12-10 NOTE — Progress Notes (Signed)
 [facility] History and Physical   Primary Care Physician:  Gretta Comer POUR, NP   Reason for Procedure:   Abnormal colonoscopy  Plan:    colonoscopy     HPI: James Barry is a 68 y.o. male  here for colonoscopy surveillance - had an exam 2/204 showing an abnormal AO - biopsies negative for any precancerous changes. CT showed mild soft tissue protuberance but appendix normal. He has elected for surveillance as opposed to appendectomy.   . Patient denies any bowel symptoms at this time. Otherwise feels well without any cardiopulmonary symptoms.   I have discussed risks / benefits of anesthesia and endoscopic procedure with Garnette JONETTA Novak and they wish to proceed with the exams as outlined today.    Past Medical History:  Diagnosis Date   Acute foot pain, right 09/24/2022   Acute neck pain 08/19/2019   Acute thoracic back pain 07/30/2019   Anxiety    Arthritis    Cancer (HCC)    prostate   Claustrophobia    Clavicular asymmetry 07/30/2019   Diverticulosis    Elevated PSA 04/20/2020   Erectile dysfunction    GERD (gastroesophageal reflux disease)    History of diverticulitis 08/29/2020   Hyperlipidemia    Malignant neoplasm of prostate (HCC) 10/18/2020   Vertigo     Past Surgical History:  Procedure Laterality Date   COLONOSCOPY  03/19/2011   cleared for 5 yrs- Dr @ [facility])   colonoscopy     HERNIA REPAIR     LYMPHADENECTOMY Bilateral 12/04/2020   Procedure: LYMPHADENECTOMY, PELVIC;  Surgeon: Renda Glance, MD;  Location: [facility];  Service: Urology;  Laterality: Bilateral;   ROBOT ASSISTED LAPAROSCOPIC RADICAL PROSTATECTOMY N/A 12/04/2020   Procedure: XI ROBOTIC ASSISTED LAPAROSCOPIC RADICAL PROSTATECTOMY LEVEL 2;  Surgeon: Renda Glance, MD;  Location: [facility];  Service: Urology;  Laterality: N/A;   UPPER GASTROINTESTINAL ENDOSCOPY      Prior to Admission medications   Medication Sig Start Date End Date Taking? Authorizing  Provider  acetaminophen  (TYLENOL ) 650 MG CR tablet Take 1,300 mg by mouth every morning.   Yes [provider]  diclofenac Sodium (VOLTAREN) 1 % GEL Apply topically as needed.   Yes [provider]  docusate sodium  (COLACE) 100 MG capsule Take 1 capsule (100 mg total) by mouth 2 (two) times daily. 12/04/20  Yes Dancy, Alan, PA-C  emtricitabine -tenofovir  AF (DESCOVY ) 200-25 MG tablet TAKE 1 TABLET BY MOUTH DAILY. 09/30/23 01/28/24 Yes Calone, Gregory D, FNP  esomeprazole  (NEXIUM ) 40 MG capsule Take 1 capsule (40 mg total) by mouth daily for heartburn. 06/26/23  Yes Ambrose Wile, Elspeth SQUIBB, MD  Omega-3 Fatty Acids (FISH OIL) 300 MG CAPS Take by mouth in the morning and at bedtime.   Yes [provider]  OVER THE COUNTER MEDICATION daily at 12 noon. Fiber pills   Yes [provider]  rosuvastatin  (CRESTOR ) 10 MG tablet Take 1 tablet (10 mg total) by mouth daily. 01/07/23  Yes Jeffrie Oneil BROCKS, MD  sildenafil  (VIAGRA ) 100 MG tablet Take 1 tablet by mouth 30 - 60 minutes prior to sexual activity as needed. Patient taking differently: as needed. Take 1 tablet by mouth 30 - 60 minutes prior to sexual activity as needed. 04/21/23  Yes Clark, Katherine K, NP  sildenafil  (VIAGRA ) 100 MG tablet Take 1 tablet (100 mg total) by mouth when needed as directed. 06/17/23  Yes   aspirin  EC 81 MG tablet Take 1 tablet (81 mg total) by  mouth daily. Swallow whole. 11/02/21   Jeffrie Oneil BROCKS, MD  neomycin -polymyxin-dexamethasone  (MAXITROL ) 0.1 % ophthalmic suspension Instill 1 drop into both eyes four times a day Patient not taking: Reported on 11/28/2023 11/19/22       Current Outpatient Medications  Medication Sig Dispense Refill   acetaminophen  (TYLENOL ) 650 MG CR tablet Take 1,300 mg by mouth every morning.     diclofenac Sodium (VOLTAREN) 1 % GEL Apply topically as needed.     docusate sodium  (COLACE) 100 MG capsule Take 1 capsule (100 mg total) by mouth 2 (two) times daily.      emtricitabine -tenofovir  AF (DESCOVY ) 200-25 MG tablet TAKE 1 TABLET BY MOUTH DAILY. 30 tablet 3   esomeprazole  (NEXIUM ) 40 MG capsule Take 1 capsule (40 mg total) by mouth daily for heartburn. 90 capsule 1   Omega-3 Fatty Acids (FISH OIL) 300 MG CAPS Take by mouth in the morning and at bedtime.     OVER THE COUNTER MEDICATION daily at 12 noon. Fiber pills     rosuvastatin  (CRESTOR ) 10 MG tablet Take 1 tablet (10 mg total) by mouth daily. 90 tablet 3   sildenafil  (VIAGRA ) 100 MG tablet Take 1 tablet by mouth 30 - 60 minutes prior to sexual activity as needed. (Patient taking differently: as needed. Take 1 tablet by mouth 30 - 60 minutes prior to sexual activity as needed.) 90 tablet 1   sildenafil  (VIAGRA ) 100 MG tablet Take 1 tablet (100 mg total) by mouth when needed as directed. 30 tablet 11   aspirin  EC 81 MG tablet Take 1 tablet (81 mg total) by mouth daily. Swallow whole. 90 tablet 3   neomycin -polymyxin-dexamethasone  (MAXITROL ) 0.1 % ophthalmic suspension Instill 1 drop into both eyes four times a day (Patient not taking: Reported on 11/28/2023) 5 mL 0   Current Facility-Administered Medications  Medication Dose Route Frequency Provider Last Rate Last Admin   0.9 %  sodium chloride  infusion  500 mL Intravenous Once Kassem Kibbe, Elspeth SQUIBB, MD        Allergies as of 12/10/2023 - Review Complete 12/10/2023  Allergen Reaction Noted   Buspirone Other (See Comments) 07/14/2014   Cephalexin Other (See Comments) 07/14/2014    Family History  Problem Relation Age of Onset   Breast cancer Mother    Lung cancer Father    Heart disease Father    Hypertension Father    Arthritis Maternal Grandmother    Colon cancer Neg Hx    Colon polyps Neg Hx    Esophageal cancer Neg Hx    Rectal cancer Neg Hx    Stomach cancer Neg Hx     Social History   Socioeconomic History   Marital status: Married    Spouse name: Not on file   Number of children: Not on file   Years of education: Not on file    Highest education level: Not on file  Occupational History   Not on file  Tobacco Use   Smoking status: Never   Smokeless tobacco: Never  Vaping Use   Vaping status: Never Used  Substance and Sexual Activity   Alcohol use: Yes    Alcohol/week: 0.0 standard drinks of alcohol    Comment: social   Drug use: No   Sexual activity: Yes    Partners: Male    Comment: offered condoms  Other Topics Concern   Not on file  Social History Narrative   Married.   No children.   Retired.   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   Social Drivers of Corporate investment banker Strain: Not on file  Food Insecurity: Not on file  Transportation Needs: Not on file  Physical Activity: Not on file  Stress: Not on file  Social Connections: Not on file  Intimate Partner Violence: Not on file    Review of Systems: All other review of systems negative except as mentioned in the HPI.  Physical Exam: Vital signs BP (!) 131/99   Pulse 77   Temp (!) 97.5 F (36.4 C) (Temporal)   Ht 5' 7 (1.702 m)   Wt 155 lb (70.3 kg)   SpO2 98%   BMI 24.28 kg/m   General:   Alert,  Well-developed, pleasant and cooperative in NAD Lungs:  Clear throughout to auscultation.   Heart:  Regular rate and rhythm Abdomen:  Soft, nontender and nondistended.   Neuro/Psych:  Alert and cooperative. Normal mood and affect. A and O x 3  Marcey Naval, MD [facility]

## 2023-12-10 NOTE — Progress Notes (Signed)
 Sedate, gd SR, tolerated procedure well, VSS, report to RN

## 2023-12-10 NOTE — Progress Notes (Signed)
 Called to room to assist during endoscopic procedure.  Patient ID and intended procedure confirmed with present staff. Received instructions for my participation in the procedure from the performing physician.

## 2023-12-10 NOTE — Patient Instructions (Signed)

## 2023-12-10 NOTE — Op Note (Signed)
 [facility] Patient Name: James Barry Procedure Date: 12/10/2023 9:38 AM MRN: [id number] Endoscopist: Elspeth P. Leigh , MD, [id number] Age: 68 Referring MD:  Date of Birth: 05/16/1955 Gender: Male Account #: [id number] Procedure:                Colonoscopy Indications:              abnormal colonoscopy - altered mucosa at the AO on                            04/2022, CT shows normal appendix with some soft                            tissue prominence at the AO - colonoscopy to                            reassess the area Medicines:                Monitored Anesthesia Care Procedure:                Pre-Anesthesia Assessment:                           - Prior to the procedure, a History and Physical                            was performed, and patient medications and                            allergies were reviewed. The patient's tolerance of                            previous anesthesia was also reviewed. The risks                            and benefits of the procedure and the sedation                            options and risks were discussed with the patient.                            All questions were answered, and informed consent                            was obtained. Prior Anticoagulants: The patient has                            taken no anticoagulant or antiplatelet agents. ASA                            Grade Assessment: III - A patient with severe                            systemic disease. After reviewing the risks and  benefits, the patient was deemed in satisfactory                            condition to undergo the procedure.                           After obtaining informed consent, the colonoscope                            was passed under direct vision. Throughout the                            procedure, the patient's blood pressure, pulse, and                            oxygen  saturations were monitored  continuously. The                            Olympus Scope [id number] was introduced through the                            anus and advanced to the the cecum, identified by                            appendiceal orifice and ileocecal valve. The                            colonoscopy was performed without difficulty. The                            patient tolerated the procedure well. The quality                            of the bowel preparation was adequate. The                            ileocecal valve, appendiceal orifice, and rectum                            were photographed. Scope In: 9:42:26 AM Scope Out: 10:00:54 AM Scope Withdrawal Time: 0 hours 16 minutes 7 seconds  Total Procedure Duration: 0 hours 18 minutes 28 seconds  Findings:                 The perianal and digital rectal examinations were                            normal.                           A 4 mm polyp was found in the cecum. The polyp was                            flat. The polyp was removed with a cold snare.  Resection and retrieval were complete.                           The mucosa was altered at the appendiceal orifice.                            It was nodular and prominent however benign                            appearing, no overt adenomatous change. Overall                            unchanged since the last exam. Biopsies were taken                            with a cold forceps for histology.                           Multiple diverticula were found in the transverse                            colon and left colon, along with surrounding                            scattered erythema in the left colon.                           Anal papilla(e) were hypertrophied.                           Non-bleeding internal hemorrhoids were found. The                            hemorrhoids were small.                           The exam was otherwise without  abnormality. Complications:            No immediate complications. Estimated blood loss:                            Minimal. Estimated Blood Loss:     Estimated blood loss was minimal. Impression:               - One 4 mm polyp in the cecum, removed with a cold                            snare. Resected and retrieved.                           - Altered mucosa at the appendiceal orifice as                            outlined - unchanged since the last exam. Biopsied.                           -  Diverticulosis in the transverse colon and in the                            left colon.                           - Anal papilla(e) were hypertrophied.                           - Non-bleeding internal hemorrhoids.                           - The examination was otherwise normal. Recommendation:           - Patient has a contact number available for                            emergencies. The signs and symptoms of potential                            delayed complications were discussed with the                            patient. Return to normal activities tomorrow.                            Written discharge instructions were provided to the                            patient.                           - Resume previous diet.                           - Continue present medications.                           - Await pathology results. This appears to more                            then likely be benign / reactive changes at the [facility],                            will await pathology results. Only definitive way                            to tell for sure may be appendectomy, will discuss                            with the patient, this overall appears to most                            likely be a benign change. Elspeth P. Seleta Hovland, MD 12/10/2023 10:10:50 AM This report has been signed electronically.

## 2023-12-11 ENCOUNTER — Ambulatory Visit (INDEPENDENT_AMBULATORY_CARE_PROVIDER_SITE_OTHER)

## 2023-12-11 ENCOUNTER — Telehealth: Payer: Self-pay

## 2023-12-11 ENCOUNTER — Other Ambulatory Visit: Payer: Self-pay

## 2023-12-11 DIAGNOSIS — Z23 Encounter for immunization: Secondary | ICD-10-CM | POA: Diagnosis not present

## 2023-12-11 NOTE — Telephone Encounter (Signed)
  Follow up Call-     12/10/2023    8:38 AM 05/02/2022    7:15 AM  Call back number  Post procedure Call Back phone  # [phone]  Permission to leave phone message Yes Yes     Patient questions:  Do you have a fever, pain , or abdominal swelling? No. Pain Score  0 *  Have you tolerated food without any problems? Yes.    Have you been able to return to your normal activities? Yes.    Do you have any questions about your discharge instructions: Diet   No. Medications  No. Follow up visit  No.  Do you have questions or concerns about your Care? No.  Actions: * If pain score is 4 or above: No action needed, pain <4.

## 2023-12-12 LAB — SURGICAL PATHOLOGY

## 2023-12-13 ENCOUNTER — Encounter (INDEPENDENT_AMBULATORY_CARE_PROVIDER_SITE_OTHER): Payer: Self-pay

## 2023-12-15 ENCOUNTER — Other Ambulatory Visit: Payer: Self-pay

## 2023-12-15 ENCOUNTER — Other Ambulatory Visit: Payer: Self-pay | Admitting: Pharmacy Technician

## 2023-12-15 ENCOUNTER — Ambulatory Visit: Payer: Self-pay | Admitting: Gastroenterology

## 2023-12-15 NOTE — Progress Notes (Signed)
 Specialty Pharmacy Refill Coordination Note  James Barry is a 68 y.o. male contacted today regarding refills of specialty medication(s) Emtricitabine -Tenofovir  AF (DESCOVY )   Patient requested Pickup at [facility] at [place] date: 12/16/23  Medication will be filled on 12/15/23.   Patient answered questionnaire

## 2023-12-17 DIAGNOSIS — C61 Malignant neoplasm of prostate: Secondary | ICD-10-CM | POA: Diagnosis not present

## 2023-12-22 ENCOUNTER — Other Ambulatory Visit (HOSPITAL_COMMUNITY): Payer: Self-pay

## 2023-12-23 DIAGNOSIS — R399 Unspecified symptoms and signs involving the genitourinary system: Secondary | ICD-10-CM | POA: Diagnosis not present

## 2023-12-24 DIAGNOSIS — N5201 Erectile dysfunction due to arterial insufficiency: Secondary | ICD-10-CM | POA: Diagnosis not present

## 2023-12-24 DIAGNOSIS — C61 Malignant neoplasm of prostate: Secondary | ICD-10-CM | POA: Diagnosis not present

## 2023-12-25 ENCOUNTER — Ambulatory Visit: Admitting: Family

## 2024-01-06 ENCOUNTER — Other Ambulatory Visit: Payer: Self-pay | Admitting: Cardiology

## 2024-01-06 ENCOUNTER — Other Ambulatory Visit (HOSPITAL_COMMUNITY): Payer: Self-pay

## 2024-01-07 ENCOUNTER — Other Ambulatory Visit: Payer: Self-pay

## 2024-01-08 ENCOUNTER — Encounter (INDEPENDENT_AMBULATORY_CARE_PROVIDER_SITE_OTHER): Payer: Self-pay

## 2024-01-09 ENCOUNTER — Other Ambulatory Visit (HOSPITAL_COMMUNITY): Payer: Self-pay

## 2024-01-09 ENCOUNTER — Other Ambulatory Visit: Payer: Self-pay

## 2024-01-09 MED ORDER — ROSUVASTATIN CALCIUM 10 MG PO TABS
10.0000 mg | ORAL_TABLET | Freq: Every day | ORAL | 0 refills | Status: DC
  Filled 2024-01-09: qty 30, 30d supply, fill #0

## 2024-01-09 NOTE — Progress Notes (Signed)
 Specialty Pharmacy Refill Coordination Note  James Barry is a 68 y.o. male contacted today regarding refills of specialty medication(s) Emtricitabine -Tenofovir  AF (DESCOVY )   Patient requested (Patient-Rptd) Pickup at [facility] date: (Patient-Rptd) 01/15/24   Medication will be filled on 01/14/24.

## 2024-01-10 ENCOUNTER — Other Ambulatory Visit (HOSPITAL_COMMUNITY): Payer: Self-pay

## 2024-01-10 ENCOUNTER — Other Ambulatory Visit: Payer: Self-pay | Admitting: Cardiology

## 2024-01-13 ENCOUNTER — Ambulatory Visit: Attending: Cardiology | Admitting: Cardiology

## 2024-01-13 ENCOUNTER — Other Ambulatory Visit (HOSPITAL_COMMUNITY): Payer: Self-pay

## 2024-01-13 ENCOUNTER — Other Ambulatory Visit: Payer: Self-pay

## 2024-01-13 ENCOUNTER — Encounter: Payer: Self-pay | Admitting: Cardiology

## 2024-01-13 VITALS — BP 156/98 | HR 77 | Ht 67.0 in | Wt 155.0 lb

## 2024-01-13 DIAGNOSIS — I10 Essential (primary) hypertension: Secondary | ICD-10-CM

## 2024-01-13 DIAGNOSIS — I2510 Atherosclerotic heart disease of native coronary artery without angina pectoris: Secondary | ICD-10-CM

## 2024-01-13 MED ORDER — ROSUVASTATIN CALCIUM 10 MG PO TABS
10.0000 mg | ORAL_TABLET | Freq: Every day | ORAL | 3 refills | Status: AC
  Filled 2024-01-13 – 2024-02-02 (×3): qty 90, 90d supply, fill #0
  Filled 2024-04-07 – 2024-04-20 (×8): qty 90, 90d supply, fill #1

## 2024-01-13 NOTE — Progress Notes (Signed)
 Cardiology Office Note:  .   Date:  01/13/2024  ID:  NAREK KNISS, DOB 1955/10/24, MRN [id number] PCP: Gretta Comer POUR, NP  [facility] Providers Cardiologist:  Oneil Parchment, MD     History of Present Illness: .   James Barry is a 68 y.o. male Discussed the use of AI scribe software   History of Present Illness Trayvond Viets is a 68 year old male with coronary artery disease who presents for follow-up. He is accompanied by his husband.  He has a history of coronary artery disease with a calcium  score of 461 in 2023, with nonobstructive disease in the proximal LAD and circumflex and aortic atherosclerosis noted on prior imaging. He was started on Rosuvastatin  10 mg daily at that time. He takes Rosuvastatin  in the evening around 6 or 7 PM to avoid interaction with his antacid medication, which he takes at 2 PM. He also takes aspirin  81 mg daily in the morning. His LDL was 37 on September 24, 2022, and is currently 24.  He recalls experiencing chest pain during his last visit, described as muscular tension between the ribs radiating down his arm, similar to a heart attack. This pain resolved after consultation with his physical therapist.  He and his husband have reduced participation in physically demanding activities like [employer] [place] due to age and availability but are planning to participate in a bridge run in March. He has recently acquired a puppy, doodle, [place], providing companionship and reducing loneliness.      Studies Reviewed: SABRA   EKG Interpretation Date/Time:  Tuesday January 13 2024 15:46:32 EDT Ventricular Rate:  77 PR Interval:  164 QRS Duration:  98 QT Interval:  394 QTC Calculation: 445 R Axis:   46  Text Interpretation: Normal sinus rhythm Normal ECG When compared with ECG of 07-Jan-2023 08:34, No significant change was found Confirmed by Parchment Oneil ([id number]) on 01/13/2024 3:55:18 PM    Results LABS LDL: 37 (09/24/2022) LDL:  41  RADIOLOGY Calcium  score: 461 with mild nonobstructive disease in the proximal left anterior descending (LAD) artery and circumflex artery with aortic atherosclerosis (2023)  DIAGNOSTIC EKG: Normal (01/13/2024) Risk Assessment/Calculations:           Physical Exam:   VS:  BP (!) 156/98   Pulse 77   Ht 5' 7 (1.702 m)   Wt 155 lb (70.3 kg)   SpO2 97%   BMI 24.28 kg/m    Wt Readings from Last 3 Encounters:  01/13/24 155 lb (70.3 kg)  12/10/23 155 lb (70.3 kg)  11/28/23 155 lb (70.3 kg)    GEN: Well nourished, well developed in no acute distress NECK: No JVD; No carotid bruits CARDIAC: RRR, no murmurs, no rubs, no gallops RESPIRATORY:  Clear to auscultation without rales, wheezing or rhonchi  ABDOMEN: Soft, non-tender, non-distended EXTREMITIES:  No edema; No deformity   ASSESSMENT AND PLAN: .    Assessment and Plan Assessment & Plan Nonobstructive coronary artery disease Nonobstructive coronary artery disease with prior calcium  score of 461 in 2023, indicating mild nonobstructive disease in the proximal LAD and circumflex with aortic atherosclerosis. LDL cholesterol is well-controlled at 41 mg/dL on current medication regimen. No current chest pain, and previous chest discomfort was attributed to muscular tension, resolved with physical therapy. EKG today shows no issues. - Continue Rosuvastatin  10 mg oral daily. - Continue Aspirin  81 mg oral daily. - Ensure 90-day supply of Rosuvastatin  is provided.  Hyperlipidemia - Continue with  Crestor  10 mg once a day         Dispo: 1 year  Signed, Oneil Parchment, MD

## 2024-01-13 NOTE — Patient Instructions (Signed)
 Medication Instructions:  The current medical regimen is effective;  continue present plan and medications.  *If you need a refill on your cardiac medications before your next appointment, please call your pharmacy*  Follow-Up: At [facility], you and your health needs are our priority.  As part of our continuing mission to provide you with exceptional heart care, our providers are all part of one team.  This team includes your primary Cardiologist (physician) and Advanced Practice Providers or APPs (Physician Assistants and Nurse Practitioners) who all work together to provide you with the care you need, when you need it.  Your next appointment:   1 year(s)  Provider:   Dr Dorothye Gathers     We recommend signing up for the patient portal called "MyChart".  Sign up information is provided on this After Visit Summary.  MyChart is used to connect with patients for Virtual Visits (Telemedicine).  Patients are able to view lab/test results, encounter notes, upcoming appointments, etc.  Non-urgent messages can be sent to your provider as well.   To learn more about what you can do with MyChart, go to [email or web address].

## 2024-01-15 ENCOUNTER — Other Ambulatory Visit (HOSPITAL_COMMUNITY): Payer: Self-pay

## 2024-01-20 ENCOUNTER — Ambulatory Visit (INDEPENDENT_AMBULATORY_CARE_PROVIDER_SITE_OTHER): Admitting: Family

## 2024-01-20 ENCOUNTER — Other Ambulatory Visit: Payer: Self-pay

## 2024-01-20 ENCOUNTER — Other Ambulatory Visit (HOSPITAL_COMMUNITY)
Admission: RE | Admit: 2024-01-20 | Discharge: 2024-01-20 | Disposition: A | Discharge status: Home / Self Care | Source: Ambulatory Visit | Attending: Family | Admitting: Family

## 2024-01-20 ENCOUNTER — Encounter: Payer: Self-pay | Admitting: Family

## 2024-01-20 DIAGNOSIS — Z79899 Other long term (current) drug therapy: Secondary | ICD-10-CM | POA: Diagnosis not present

## 2024-01-20 MED ORDER — EMTRICITABINE-TENOFOVIR AF 200-25 MG PO TABS
1.0000 | ORAL_TABLET | Freq: Every day | ORAL | 3 refills | Status: AC
  Filled 2024-01-20 – 2024-02-04 (×2): qty 30, 30d supply, fill #0
  Filled 2024-03-08: qty 30, 30d supply, fill #1
  Filled 2024-04-07: qty 30, 30d supply, fill #2

## 2024-01-20 NOTE — Patient Instructions (Signed)
 Nice to see you.  We will check your lab work today.  Continue to take your medication daily as prescribed.  Refills have been sent to the pharmacy.  Plan for follow up in 3 months or sooner if needed with lab work on the same day.  Have a great day and stay safe!

## 2024-01-20 NOTE — Assessment & Plan Note (Signed)
 James Barry continues to do well with current dose of Descovy  with no adverse side effects or problems obtaining medication from the pharmacy for preexposure prophylaxis of HIV.  Reviewed previous lab work and discussed plan of care.  Condoms and site-specific STD testing offered.  Check blood work. Continue current dose of Descovy .  Plan for follow-up in 3 months or sooner if needed.

## 2024-01-20 NOTE — Progress Notes (Signed)
 Subjective:   Patient ID: James Barry, male    DOB: 10-24-1955, 68 y.o.   MRN: [id number]  Chief Complaint  Patient presents with   Follow-up    PrEP    HPI:  James Barry is a 68 y.o. male on preexposure prophylaxis for HIV last seen on 09/30/2023 with good adherence and tolerance to Descovy  and negative HIV and STD testing.  Here today for follow-up.  James Barry has been doing well since his last office visit and continues to take Descovy  as prescribed with no adverse side effects or problems obtaining medication from the pharmacy.  Currently sexually active with condoms and site-specific STD testing offered.  Denies fevers, chills, night sweats, headaches, changes in vision, neck pain/stiffness, nausea, diarrhea, vomiting, lesions or rashes.    Allergies  Allergen Reactions   Buspirone Other (See Comments)    Unaware    Cephalexin Other (See Comments)    Unaware       Outpatient Medications Prior to Visit  Medication Sig Dispense Refill   acetaminophen  (TYLENOL ) 650 MG CR tablet Take 1,300 mg by mouth every morning.     aspirin  EC 81 MG tablet Take 1 tablet (81 mg total) by mouth daily. Swallow whole. 90 tablet 3   diclofenac Sodium (VOLTAREN) 1 % GEL Apply topically as needed.     docusate sodium  (COLACE) 100 MG capsule Take 1 capsule (100 mg total) by mouth 2 (two) times daily.     esomeprazole  (NEXIUM ) 40 MG capsule Take 1 capsule (40 mg total) by mouth daily for heartburn. 90 capsule 1   neomycin -polymyxin-dexamethasone  (MAXITROL ) 0.1 % ophthalmic suspension Instill 1 drop into both eyes four times a day (Patient not taking: Reported on 01/13/2024) 5 mL 0   Omega-3 Fatty Acids (FISH OIL) 300 MG CAPS Take by mouth in the morning and at bedtime.     OVER THE COUNTER MEDICATION daily at 12 noon. Fiber pills     rosuvastatin  (CRESTOR ) 10 MG tablet Take 1 tablet (10 mg total) by mouth daily. 90 tablet 3   sildenafil  (VIAGRA ) 100 MG tablet Take 1 tablet by mouth 30 - 60  minutes prior to sexual activity as needed. (Patient taking differently: as needed. Take 1 tablet by mouth 30 - 60 minutes prior to sexual activity as needed.) 90 tablet 1   sildenafil  (VIAGRA ) 100 MG tablet Take 1 tablet (100 mg total) by mouth when needed as directed. 30 tablet 11   emtricitabine -tenofovir  AF (DESCOVY ) 200-25 MG tablet TAKE 1 TABLET BY MOUTH DAILY. 30 tablet 3   Facility-Administered Medications Prior to Visit  Medication Dose Route Frequency Provider Last Rate Last Admin   0.9 %  sodium chloride  infusion  500 mL Intravenous Once Armbruster, Elspeth SQUIBB, MD         Past Medical History:  Diagnosis Date   Acute foot pain, right 09/24/2022   Acute neck pain 08/19/2019   Acute thoracic back pain 07/30/2019   Anxiety    Arthritis    Cancer (HCC)    prostate   Claustrophobia    Clavicular asymmetry 07/30/2019   Diverticulosis    Elevated PSA 04/20/2020   Erectile dysfunction    GERD (gastroesophageal reflux disease)    History of diverticulitis 08/29/2020   Hyperlipidemia    Malignant neoplasm of prostate (HCC) 10/18/2020   Vertigo      Past Surgical History:  Procedure Laterality Date   COLONOSCOPY  03/19/2011   cleared for 5 yrs- Dr @  [facility])   colonoscopy     HERNIA REPAIR     LYMPHADENECTOMY Bilateral 12/04/2020   Procedure: LYMPHADENECTOMY, PELVIC;  Surgeon: Renda Glance, MD;  Location: [facility];  Service: Urology;  Laterality: Bilateral;   ROBOT ASSISTED LAPAROSCOPIC RADICAL PROSTATECTOMY N/A 12/04/2020   Procedure: XI ROBOTIC ASSISTED LAPAROSCOPIC RADICAL PROSTATECTOMY LEVEL 2;  Surgeon: Renda Glance, MD;  Location: [facility];  Service: Urology;  Laterality: N/A;   UPPER GASTROINTESTINAL ENDOSCOPY         Review of Systems  Constitutional:  Negative for appetite change, chills, fatigue, fever and unexpected weight change.  Eyes:  Negative for visual disturbance.  Respiratory:  Negative for cough, chest tightness, shortness of  breath and wheezing.   Cardiovascular:  Negative for chest pain and leg swelling.  Gastrointestinal:  Negative for abdominal pain, constipation, diarrhea, nausea and vomiting.  Genitourinary:  Negative for dysuria, flank pain, frequency, genital sores, hematuria and urgency.  Skin:  Negative for rash.  Allergic/Immunologic: Negative for immunocompromised state.  Neurological:  Negative for dizziness and headaches.    Objective:   There were no vitals taken for this visit. Nursing note and vital signs reviewed.  Physical Exam Constitutional:      General: He is not in acute distress.    Appearance: He is well-developed.  Eyes:     Conjunctiva/sclera: Conjunctivae normal.  Cardiovascular:     Rate and Rhythm: Normal rate and regular rhythm.     Heart sounds: Normal heart sounds. No murmur heard.    No friction rub. No gallop.  Pulmonary:     Effort: Pulmonary effort is normal. No respiratory distress.     Breath sounds: Normal breath sounds. No wheezing or rales.  Chest:     Chest wall: No tenderness.  Abdominal:     General: Bowel sounds are normal.     Palpations: Abdomen is soft.     Tenderness: There is no abdominal tenderness.  Musculoskeletal:     Cervical back: Neck supple.  Lymphadenopathy:     Cervical: No cervical adenopathy.  Skin:    General: Skin is warm and dry.     Findings: No rash.  Neurological:     Mental Status: He is alert and oriented to person, place, and time.         09/26/2023   10:47 AM 07/03/2023    3:10 PM 01/15/2023    2:28 PM 10/07/2022    1:49 PM 09/24/2022   10:36 AM  Depression screen PHQ 2/9  Decreased Interest 0 0 0 0 0  Down, Depressed, Hopeless 0 0 0 0 0  PHQ - 2 Score 0 0 0 0 0  Altered sleeping  0  0 0  Tired, decreased energy  0  0 0  Change in appetite  0  0 0  Feeling bad or failure about yourself   0  0 0  Trouble concentrating  0  0 0  Moving slowly or fidgety/restless  0  0 0  Suicidal thoughts  0  0 0  PHQ-9 Score   0  0 0  Difficult doing work/chores  Not difficult at all   Not difficult at all     Assessment & Plan:    Patient Active Problem List   Diagnosis Date Noted   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   CAD (coronary artery disease) 09/20/2021   Therapeutic drug monitoring 09/05/2021   Precordial chest pain 07/19/2021   Elevated blood pressure reading 07/19/2021  History of prostate cancer 12/04/2020   High risk homosexual behavior 09/23/2018   Preventative health care 08/24/2018   On pre-exposure prophylaxis for HIV 12/17/2017   Chronic left shoulder pain 08/13/2017   Nocturia 08/13/2017   Exposure to HIV 06/06/2017   Erectile dysfunction 06/28/2015   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  Reviewed previous lab work and discussed plan of care.  Condoms and site-specific STD testing offered.  Check blood work. Continue current dose of Descovy .  Plan for follow-up in 3 months or sooner if needed.      Relevant Medications   emtricitabine -tenofovir  AF (DESCOVY ) 200-25 MG tablet   Other Relevant Orders   RPR   Urine cytology ancillary only   Cytology (oral, anal, urethral) ancillary only   Cytology (oral, anal, urethral) ancillary only   HIV Antibody (routine testing w rflx)     I am having Garnette CHARM Myrna Marcey maintain his acetaminophen , docusate sodium , aspirin  EC, neomycin -polymyxin-dexamethasone , sildenafil , sildenafil , esomeprazole , Fish Oil, diclofenac Sodium, OVER THE COUNTER MEDICATION, rosuvastatin , and emtricitabine -tenofovir  AF. We will continue to  administer sodium chloride .   Meds ordered this encounter  Medications   emtricitabine -tenofovir  AF (DESCOVY ) 200-25 MG tablet    Sig: TAKE 1 TABLET BY MOUTH DAILY.    Dispense:  30 tablet    Refill:  3    Will pick up    Supervising Provider:   LUIZ CHANNEL [phone]    Prescription Type::   Renewal     Follow-up: Return in about 3 months (around 04/21/2024). or sooner if needed.   Cathlyn July, MSN, FNP-C Nurse Practitioner [facility] for [facility]ious Disease [facility] Main number: [phone]

## 2024-01-21 LAB — CYTOLOGY, (ORAL, ANAL, URETHRAL) ANCILLARY ONLY
Chlamydia: NEGATIVE
Chlamydia: NEGATIVE
Comment: NEGATIVE
Comment: NEGATIVE
Comment: NORMAL
Comment: NORMAL
Neisseria Gonorrhea: NEGATIVE
Neisseria Gonorrhea: NEGATIVE

## 2024-01-21 LAB — HIV ANTIBODY (ROUTINE TESTING W REFLEX)
HIV 1&2 Ab, 4th Generation: NONREACTIVE
HIV FINAL INTERPRETATION: NEGATIVE

## 2024-01-21 LAB — RPR: RPR Ser Ql: NONREACTIVE

## 2024-01-21 LAB — URINE CYTOLOGY ANCILLARY ONLY
Chlamydia: NEGATIVE
Comment: NEGATIVE
Comment: NORMAL
Neisseria Gonorrhea: NEGATIVE

## 2024-01-22 ENCOUNTER — Ambulatory Visit: Payer: Self-pay | Admitting: Family

## 2024-01-27 ENCOUNTER — Other Ambulatory Visit: Payer: Self-pay

## 2024-02-02 ENCOUNTER — Other Ambulatory Visit (HOSPITAL_COMMUNITY): Payer: Self-pay

## 2024-02-03 DIAGNOSIS — Z872 Personal history of diseases of the skin and subcutaneous tissue: Secondary | ICD-10-CM | POA: Diagnosis not present

## 2024-02-03 DIAGNOSIS — L218 Other seborrheic dermatitis: Secondary | ICD-10-CM | POA: Diagnosis not present

## 2024-02-03 DIAGNOSIS — L821 Other seborrheic keratosis: Secondary | ICD-10-CM | POA: Diagnosis not present

## 2024-02-03 DIAGNOSIS — D485 Neoplasm of uncertain behavior of skin: Secondary | ICD-10-CM | POA: Diagnosis not present

## 2024-02-03 DIAGNOSIS — L578 Other skin changes due to chronic exposure to nonionizing radiation: Secondary | ICD-10-CM | POA: Diagnosis not present

## 2024-02-03 DIAGNOSIS — D225 Melanocytic nevi of trunk: Secondary | ICD-10-CM | POA: Diagnosis not present

## 2024-02-03 DIAGNOSIS — Z86018 Personal history of other benign neoplasm: Secondary | ICD-10-CM | POA: Diagnosis not present

## 2024-02-04 ENCOUNTER — Other Ambulatory Visit: Payer: Self-pay

## 2024-02-04 ENCOUNTER — Other Ambulatory Visit (HOSPITAL_COMMUNITY): Payer: Self-pay

## 2024-02-06 ENCOUNTER — Other Ambulatory Visit (HOSPITAL_COMMUNITY): Payer: Self-pay

## 2024-02-06 MED ORDER — NEOMYCIN-POLYMYXIN-DEXAMETH 3.5-10000-0.1 OP SUSP
1.0000 [drp] | Freq: Four times a day (QID) | OPHTHALMIC | 0 refills | Status: AC
  Filled 2024-02-06: qty 5, 25d supply, fill #0

## 2024-02-09 ENCOUNTER — Other Ambulatory Visit: Payer: Self-pay

## 2024-02-09 NOTE — Progress Notes (Signed)
 Specialty Pharmacy Refill Coordination Note  James Barry is a 68 y.o. male contacted today regarding refills of specialty medication(s) Emtricitabine -Tenofovir  AF (DESCOVY )   Patient requested Pickup at [facility] at [place] date: 02/13/24   Medication will be filled on: 02/11/24

## 2024-02-10 ENCOUNTER — Other Ambulatory Visit: Payer: Self-pay

## 2024-02-14 ENCOUNTER — Other Ambulatory Visit (HOSPITAL_COMMUNITY): Payer: Self-pay

## 2024-02-16 ENCOUNTER — Other Ambulatory Visit: Payer: Self-pay

## 2024-03-08 ENCOUNTER — Other Ambulatory Visit (HOSPITAL_COMMUNITY): Payer: Self-pay

## 2024-03-08 ENCOUNTER — Other Ambulatory Visit: Payer: Self-pay

## 2024-03-10 ENCOUNTER — Other Ambulatory Visit: Payer: Self-pay

## 2024-03-10 NOTE — Progress Notes (Signed)
 Specialty Pharmacy Refill Coordination Note  James Barry is a 68 y.o. male contacted today regarding refills of specialty medication(s) Emtricitabine -Tenofovir  AF (DESCOVY )   Patient requested Pickup at [facility] at [place] date: 03/15/24   Medication will be filled on: 03/12/24

## 2024-03-12 ENCOUNTER — Other Ambulatory Visit: Payer: Self-pay

## 2024-03-15 ENCOUNTER — Other Ambulatory Visit (HOSPITAL_COMMUNITY): Payer: Self-pay

## 2024-03-15 ENCOUNTER — Other Ambulatory Visit: Payer: Self-pay

## 2024-03-15 ENCOUNTER — Other Ambulatory Visit: Payer: Self-pay | Admitting: Gastroenterology

## 2024-03-15 DIAGNOSIS — K219 Gastro-esophageal reflux disease without esophagitis: Secondary | ICD-10-CM

## 2024-03-15 MED ORDER — ESOMEPRAZOLE MAGNESIUM 40 MG PO CPDR
40.0000 mg | DELAYED_RELEASE_CAPSULE | Freq: Every day | ORAL | 1 refills | Status: AC
  Filled 2024-03-15: qty 90, 90d supply, fill #0

## 2024-04-07 ENCOUNTER — Other Ambulatory Visit (HOSPITAL_COMMUNITY): Payer: Self-pay

## 2024-04-07 ENCOUNTER — Other Ambulatory Visit: Payer: Self-pay

## 2024-04-08 ENCOUNTER — Other Ambulatory Visit (HOSPITAL_COMMUNITY): Payer: Self-pay

## 2024-04-08 ENCOUNTER — Other Ambulatory Visit: Payer: Self-pay

## 2024-04-08 ENCOUNTER — Other Ambulatory Visit (HOSPITAL_COMMUNITY)
Admission: RE | Admit: 2024-04-08 | Discharge: 2024-04-08 | Disposition: A | Source: Ambulatory Visit | Attending: Family | Admitting: Family

## 2024-04-08 ENCOUNTER — Ambulatory Visit: Admitting: Family

## 2024-04-08 ENCOUNTER — Encounter: Payer: Self-pay | Admitting: Family

## 2024-04-08 VITALS — BP 144/86 | HR 73 | Temp 97.6°F | Resp 16 | Ht 67.0 in | Wt 161.0 lb

## 2024-04-08 DIAGNOSIS — Z79899 Other long term (current) drug therapy: Secondary | ICD-10-CM | POA: Insufficient documentation

## 2024-04-08 MED ORDER — DOXYCYCLINE HYCLATE 100 MG PO TABS
ORAL_TABLET | ORAL | 2 refills | Status: AC
  Filled 2024-04-08: qty 30, 15d supply, fill #0

## 2024-04-08 MED ORDER — EMTRICITABINE-TENOFOVIR AF 200-25 MG PO TABS
1.0000 | ORAL_TABLET | Freq: Every day | ORAL | 3 refills | Status: AC
  Filled 2024-04-08: qty 30, 30d supply, fill #0

## 2024-04-08 NOTE — Progress Notes (Signed)
 Specialty Pharmacy Refill Coordination Note  James Barry is a 69 y.o. male contacted today regarding refills of specialty medication(s) Emtricitabine -Tenofovir  AF (DESCOVY )   Patient requested (Patient-Rptd) Pickup at [facility] at [place] date: 04/13/24   Medication will be filled on: 04/09/24

## 2024-04-08 NOTE — Assessment & Plan Note (Signed)
 James Barry continues to have good adherence and tolerance to Descovy . Discussed importance of safe sexual practice and condom use. Check HIV and STD today. Continue current dose of Descovy . Plan for follow up in 3 months or sooner if needed.

## 2024-04-08 NOTE — Progress Notes (Signed)
 "  Subjective:   Patient ID: James Barry, male    DOB: 14-Apr-1955, 69 y.o.   MRN: [id number]  Chief Complaint  Patient presents with   Follow-up    PrEP    HPI:  James Barry is a 69 y.o. male on preexposure prophylaxis for HIV with Descovy  last seen on 01/20/2024 with good adherence and tolerance to Descovy .  HIV and STD testing negative.  Here today for routine follow-up.  Mr. Lomax been doing well since his last office visit and continues to take Descovy  as prescribed with no adverse side effects or problems obtaining medication from the pharmacy.  Remains sexually active with condoms and site-specific STD testing offered. Denies fevers, chills, night sweats, headaches, changes in vision, neck pain/stiffness, nausea, diarrhea, vomiting, lesions or rashes.   Allergies[1]    Outpatient Medications Prior to Visit  Medication Sig Dispense Refill   acetaminophen  (TYLENOL ) 650 MG CR tablet Take 1,300 mg by mouth every morning.     aspirin  EC 81 MG tablet Take 1 tablet (81 mg total) by mouth daily. Swallow whole. 90 tablet 3   diclofenac Sodium (VOLTAREN) 1 % GEL Apply topically as needed.     docusate sodium  (COLACE) 100 MG capsule Take 1 capsule (100 mg total) by mouth 2 (two) times daily.     esomeprazole  (NEXIUM ) 40 MG capsule Take 1 capsule (40 mg total) by mouth daily for heartburn. 90 capsule 1   Omega-3 Fatty Acids (FISH OIL) 300 MG CAPS Take by mouth in the morning and at bedtime.     OVER THE COUNTER MEDICATION daily at 12 noon. Fiber pills     rosuvastatin  (CRESTOR ) 10 MG tablet Take 1 tablet (10 mg total) by mouth daily. 90 tablet 3   sildenafil  (VIAGRA ) 100 MG tablet Take 1 tablet by mouth 30 - 60 minutes prior to sexual activity as needed. (Patient taking differently: as needed. Take 1 tablet by mouth 30 - 60 minutes prior to sexual activity as needed.) 90 tablet 1   sildenafil  (VIAGRA ) 100 MG tablet Take 1 tablet (100 mg total) by mouth when needed as directed. 30  tablet 11   emtricitabine -tenofovir  AF (DESCOVY ) 200-25 MG tablet TAKE 1 TABLET BY MOUTH DAILY. 30 tablet 3   neomycin -polymyxin-dexamethasone  (MAXITROL ) 0.1 % ophthalmic suspension Instill 1 drop into both eyes four times a day (Patient not taking: Reported on 04/08/2024) 5 mL 0   Facility-Administered Medications Prior to Visit  Medication Dose Route Frequency Provider Last Rate Last Admin   0.9 %  sodium chloride  infusion  500 mL Intravenous Once Armbruster, Elspeth SQUIBB, MD         Past Medical History:  Diagnosis Date   Acute foot pain, right 09/24/2022   Acute neck pain 08/19/2019   Acute thoracic back pain 07/30/2019   Anxiety    Arthritis    Cancer (HCC)    prostate   Claustrophobia    Clavicular asymmetry 07/30/2019   Diverticulosis    Elevated PSA 04/20/2020   Erectile dysfunction    GERD (gastroesophageal reflux disease)    History of diverticulitis 08/29/2020   Hyperlipidemia    Malignant neoplasm of prostate (HCC) 10/18/2020   Vertigo      Past Surgical History:  Procedure Laterality Date   COLONOSCOPY  03/19/2011   cleared for 5 yrs- Dr @ [facility])   colonoscopy     HERNIA REPAIR     LYMPHADENECTOMY Bilateral 12/04/2020   Procedure: LYMPHADENECTOMY, PELVIC;  Surgeon:  Renda Glance, MD;  Location: [facility];  Service: Urology;  Laterality: Bilateral;   ROBOT ASSISTED LAPAROSCOPIC RADICAL PROSTATECTOMY N/A 12/04/2020   Procedure: XI ROBOTIC ASSISTED LAPAROSCOPIC RADICAL PROSTATECTOMY LEVEL 2;  Surgeon: Renda Glance, MD;  Location: [facility];  Service: Urology;  Laterality: N/A;   UPPER GASTROINTESTINAL ENDOSCOPY         Review of Systems  Constitutional:  Negative for appetite change, chills, fatigue, fever and unexpected weight change.  Eyes:  Negative for visual disturbance.  Respiratory:  Negative for cough, chest tightness, shortness of breath and wheezing.   Cardiovascular:  Negative for chest pain and leg swelling.  Gastrointestinal:   Negative for abdominal pain, constipation, diarrhea, nausea and vomiting.  Genitourinary:  Negative for dysuria, flank pain, frequency, genital sores, hematuria and urgency.  Skin:  Negative for rash.  Allergic/Immunologic: Negative for immunocompromised state.  Neurological:  Negative for dizziness and headaches.    Objective:   BP (!) 144/86   Pulse 73   Temp 97.6 F (36.4 C) (Oral)   Resp 16   Ht 5' 7 (1.702 m)   Wt 161 lb (73 kg)   SpO2 95%   BMI 25.22 kg/m  Nursing note and vital signs reviewed.  Physical Exam Constitutional:      General: He is not in acute distress.    Appearance: He is well-developed.  Eyes:     Conjunctiva/sclera: Conjunctivae normal.  Cardiovascular:     Rate and Rhythm: Normal rate and regular rhythm.     Heart sounds: Normal heart sounds. No murmur heard.    No friction rub. No gallop.  Pulmonary:     Effort: Pulmonary effort is normal. No respiratory distress.     Breath sounds: Normal breath sounds. No wheezing or rales.  Chest:     Chest wall: No tenderness.  Abdominal:     General: Bowel sounds are normal.     Palpations: Abdomen is soft.     Tenderness: There is no abdominal tenderness.  Musculoskeletal:     Cervical back: Neck supple.  Lymphadenopathy:     Cervical: No cervical adenopathy.  Skin:    General: Skin is warm and dry.     Findings: No rash.  Neurological:     Mental Status: He is alert and oriented to person, place, and time.         09/26/2023   10:47 AM 07/03/2023    3:10 PM 01/15/2023    2:28 PM 10/07/2022    1:49 PM 09/24/2022   10:36 AM  Depression screen PHQ 2/9  Decreased Interest 0 0 0 0 0  Down, Depressed, Hopeless 0 0 0 0 0  PHQ - 2 Score 0 0 0 0 0  Altered sleeping  0  0 0  Tired, decreased energy  0  0 0  Change in appetite  0  0 0  Feeling bad or failure about yourself   0  0 0  Trouble concentrating  0  0 0  Moving slowly or fidgety/restless  0  0 0  Suicidal thoughts  0  0 0  PHQ-9 Score   0   0  0   Difficult doing work/chores  Not difficult at all   Not difficult at all     Data saved with a previous flowsheet row definition     Assessment & Plan:    Patient Active Problem List   Diagnosis Date Noted   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  CAD (coronary artery disease) 09/20/2021   Therapeutic drug monitoring 09/05/2021   Precordial chest pain 07/19/2021   Elevated blood pressure reading 07/19/2021   History of prostate cancer 12/04/2020   High risk homosexual behavior 09/23/2018   Preventative health care 08/24/2018   On pre-exposure prophylaxis for HIV 12/17/2017   Chronic left shoulder pain 08/13/2017   Nocturia 08/13/2017   Exposure to HIV 06/06/2017   Erectile dysfunction 06/28/2015   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 Sebesta continues to have good adherence and tolerance to Descovy . Discussed importance of safe sexual practice and condom use. Check HIV and STD today. Continue current dose of Descovy . Plan for follow up in 3 months or sooner if needed.       Relevant Medications   emtricitabine -tenofovir  AF (DESCOVY ) 200-25 MG tablet   Other Relevant Orders   RPR W/RFLX TO RPR TITER, TREPONEMAL AB, SCREEN AND DIAGNOSIS   Urine cytology ancillary only   Cytology (oral, anal, urethral) ancillary only   Cytology (oral, anal, urethral) ancillary only   HIV Antibody (routine testing w rflx)     I am having Garnette CHARM Myrna Marcey start on doxycycline . I am also having him maintain his acetaminophen , docusate sodium , aspirin  EC, neomycin -polymyxin-dexamethasone , sildenafil , sildenafil , Fish Oil, diclofenac Sodium, OVER THE COUNTER MEDICATION, rosuvastatin , esomeprazole , and emtricitabine -tenofovir  AF. We will  continue to administer sodium chloride .   Meds ordered this encounter  Medications   emtricitabine -tenofovir  AF (DESCOVY ) 200-25 MG tablet    Sig: TAKE 1 TABLET BY MOUTH DAILY.    Dispense:  30 tablet    Refill:  3    Will pick up    Supervising Provider:   SNIDER, CYNTHIA [phone]    Prescription Type::   Renewal   doxycycline  (VIBRA -TABS) 100 MG tablet    Sig: Take 2 tablets by mouth daily as needed and within 72 hours of sexual contact.    Dispense:  30 tablet    Refill:  2    Supervising Provider:   LUIZ CHANNEL [[id number]]     Follow-up: Return in about 3 months (around 07/07/2024). or sooner if needed.   Cathlyn July, MSN, FNP-C Nurse Practitioner [facility] for Infectious Disease [facility] Main number: [phone]      [1]  Allergies Allergen Reactions   Buspirone Other (See Comments)    Unaware    Cephalexin Other (See Comments)    Unaware    "

## 2024-04-08 NOTE — Patient Instructions (Signed)
 Nice to see you.  We will check your lab work today.  Continue to take your medication daily as prescribed.  Refills have been sent to the pharmacy.  Plan for follow up in 3 months or sooner if needed with lab work 1 weeks prior to appointment.    Have a great day and stay safe!

## 2024-04-09 ENCOUNTER — Other Ambulatory Visit: Payer: Self-pay

## 2024-04-09 ENCOUNTER — Other Ambulatory Visit (HOSPITAL_COMMUNITY): Payer: Self-pay

## 2024-04-09 LAB — CYTOLOGY, (ORAL, ANAL, URETHRAL) ANCILLARY ONLY
Chlamydia: NEGATIVE
Chlamydia: NEGATIVE
Comment: NEGATIVE
Comment: NEGATIVE
Comment: NORMAL
Comment: NORMAL
Neisseria Gonorrhea: NEGATIVE
Neisseria Gonorrhea: NEGATIVE

## 2024-04-09 LAB — URINE CYTOLOGY ANCILLARY ONLY
Chlamydia: NEGATIVE
Comment: NEGATIVE
Comment: NORMAL
Neisseria Gonorrhea: NEGATIVE

## 2024-04-09 LAB — HIV ANTIBODY (ROUTINE TESTING W REFLEX)
HIV 1&2 Ab, 4th Generation: NONREACTIVE
HIV FINAL INTERPRETATION: NEGATIVE

## 2024-04-09 LAB — SYPHILIS: RPR W/REFLEX TO RPR TITER AND TREPONEMAL ANTIBODIES, TRADITIONAL SCREENING AND DIAGNOSIS ALGORITHM: RPR Ser Ql: NONREACTIVE

## 2024-04-10 ENCOUNTER — Other Ambulatory Visit (HOSPITAL_COMMUNITY): Payer: Self-pay

## 2024-04-12 ENCOUNTER — Other Ambulatory Visit (HOSPITAL_COMMUNITY): Payer: Self-pay

## 2024-04-13 ENCOUNTER — Ambulatory Visit: Payer: Self-pay | Admitting: Family

## 2024-04-19 ENCOUNTER — Other Ambulatory Visit (HOSPITAL_COMMUNITY): Payer: Self-pay

## 2024-04-20 ENCOUNTER — Other Ambulatory Visit (HOSPITAL_COMMUNITY): Payer: Self-pay

## 2024-04-22 ENCOUNTER — Other Ambulatory Visit (HOSPITAL_COMMUNITY): Payer: Self-pay

## 2024-04-23 ENCOUNTER — Other Ambulatory Visit: Payer: Self-pay

## 2024-04-23 ENCOUNTER — Other Ambulatory Visit (HOSPITAL_COMMUNITY): Payer: Self-pay

## 2024-06-28 ENCOUNTER — Ambulatory Visit: Payer: Self-pay | Admitting: Family
